# Patient Record
Sex: Female | Born: 1980 | Race: Black or African American | Hispanic: No | Marital: Married | Smoking: Former smoker
Health system: Southern US, Community
[De-identification: ages and names within clinical notes are randomized; demographics above are authoritative.]

## PROBLEM LIST (undated history)

## (undated) ENCOUNTER — Inpatient Hospital Stay (HOSPITAL_COMMUNITY): Payer: Self-pay

## (undated) DIAGNOSIS — N809 Endometriosis, unspecified: Secondary | ICD-10-CM

## (undated) DIAGNOSIS — D560 Alpha thalassemia: Secondary | ICD-10-CM

## (undated) DIAGNOSIS — J302 Other seasonal allergic rhinitis: Secondary | ICD-10-CM

## (undated) DIAGNOSIS — T4145XA Adverse effect of unspecified anesthetic, initial encounter: Secondary | ICD-10-CM

## (undated) DIAGNOSIS — O24419 Gestational diabetes mellitus in pregnancy, unspecified control: Secondary | ICD-10-CM

## (undated) DIAGNOSIS — T8859XA Other complications of anesthesia, initial encounter: Secondary | ICD-10-CM

## (undated) DIAGNOSIS — D126 Benign neoplasm of colon, unspecified: Secondary | ICD-10-CM

## (undated) HISTORY — DX: Other complications of anesthesia, initial encounter: T88.59XA

## (undated) HISTORY — DX: Adverse effect of unspecified anesthetic, initial encounter: T41.45XA

## (undated) HISTORY — PX: DILATION AND CURETTAGE OF UTERUS: SHX78

## (undated) HISTORY — DX: Other seasonal allergic rhinitis: J30.2

## (undated) HISTORY — DX: Benign neoplasm of colon, unspecified: D12.6

---

## 2001-07-17 DIAGNOSIS — O4702 False labor before 37 completed weeks of gestation, second trimester: Secondary | ICD-10-CM

## 2009-05-21 ENCOUNTER — Emergency Department (HOSPITAL_COMMUNITY): Admission: EM | Admit: 2009-05-21 | Discharge: 2009-05-21 | Payer: Self-pay | Admitting: Emergency Medicine

## 2009-09-15 ENCOUNTER — Emergency Department (HOSPITAL_COMMUNITY): Admission: EM | Admit: 2009-09-15 | Discharge: 2009-09-16 | Payer: Self-pay | Admitting: Emergency Medicine

## 2009-10-24 ENCOUNTER — Inpatient Hospital Stay (HOSPITAL_COMMUNITY)
Admission: AD | Admit: 2009-10-24 | Discharge: 2009-10-24 | Payer: Self-pay | Source: Home / Self Care | Admitting: Obstetrics & Gynecology

## 2009-10-24 ENCOUNTER — Ambulatory Visit: Payer: Self-pay | Admitting: Nurse Practitioner

## 2010-02-27 ENCOUNTER — Inpatient Hospital Stay (HOSPITAL_COMMUNITY)
Admission: AD | Admit: 2010-02-27 | Discharge: 2010-02-27 | Payer: Self-pay | Source: Home / Self Care | Attending: Obstetrics | Admitting: Obstetrics

## 2010-04-10 ENCOUNTER — Inpatient Hospital Stay (HOSPITAL_COMMUNITY)
Admission: AD | Admit: 2010-04-10 | Discharge: 2010-04-10 | Disposition: A | Discharge status: Home / Self Care | Payer: Medicaid Other | Source: Ambulatory Visit | Attending: Obstetrics & Gynecology | Admitting: Obstetrics & Gynecology

## 2010-04-10 ENCOUNTER — Encounter (HOSPITAL_COMMUNITY): Payer: Self-pay | Admitting: Radiology

## 2010-04-10 ENCOUNTER — Inpatient Hospital Stay (HOSPITAL_COMMUNITY): Payer: Medicaid Other

## 2010-04-10 DIAGNOSIS — O99891 Other specified diseases and conditions complicating pregnancy: Secondary | ICD-10-CM | POA: Insufficient documentation

## 2010-04-10 DIAGNOSIS — R51 Headache: Secondary | ICD-10-CM | POA: Insufficient documentation

## 2010-04-10 LAB — CBC
Hemoglobin: 10.2 g/dL — ABNORMAL LOW (ref 12.0–15.0)
MCV: 70 fL — ABNORMAL LOW (ref 78.0–100.0)
RDW: 15 % (ref 11.5–15.5)

## 2010-04-10 LAB — COMPREHENSIVE METABOLIC PANEL
AST: 17 U/L (ref 0–37)
Albumin: 2.6 g/dL — ABNORMAL LOW (ref 3.5–5.2)
Alkaline Phosphatase: 93 U/L (ref 39–117)
BUN: 5 mg/dL — ABNORMAL LOW (ref 6–23)
CO2: 22 mEq/L (ref 19–32)
Calcium: 8.5 mg/dL (ref 8.4–10.5)
GFR calc Af Amer: >60 mL/min (ref >60)
Sodium: 133 mEq/L — ABNORMAL LOW (ref 135–145)
Total Bilirubin: 0.3 mg/dL (ref 0.3–1.2)
Total Protein: 6.5 g/dL (ref 6.0–8.3)

## 2010-04-10 LAB — URIC ACID: Uric Acid, Serum: 3.2 mg/dL (ref 2.4–7.0)

## 2010-04-10 LAB — URINALYSIS, ROUTINE W REFLEX MICROSCOPIC
Hgb urine dipstick: NEGATIVE
Protein, ur: NEGATIVE mg/dL

## 2010-04-11 ENCOUNTER — Other Ambulatory Visit: Payer: Self-pay | Admitting: Obstetrics

## 2010-04-17 ENCOUNTER — Inpatient Hospital Stay (HOSPITAL_COMMUNITY): Payer: Medicaid Other

## 2010-04-17 ENCOUNTER — Inpatient Hospital Stay (HOSPITAL_COMMUNITY)
Admission: AD | Admit: 2010-04-17 | Discharge: 2010-04-17 | Disposition: A | Discharge status: Home / Self Care | Payer: Medicaid Other | Source: Ambulatory Visit | Attending: Obstetrics | Admitting: Obstetrics

## 2010-04-17 DIAGNOSIS — O368190 Decreased fetal movements, unspecified trimester, not applicable or unspecified: Secondary | ICD-10-CM | POA: Insufficient documentation

## 2010-04-18 ENCOUNTER — Ambulatory Visit (HOSPITAL_COMMUNITY)
Admission: RE | Admit: 2010-04-18 | Discharge: 2010-04-18 | Disposition: A | Discharge status: Home / Self Care | Payer: Medicaid Other | Source: Ambulatory Visit | Attending: Obstetrics | Admitting: Obstetrics

## 2010-04-18 ENCOUNTER — Other Ambulatory Visit: Payer: Self-pay | Admitting: Obstetrics

## 2010-04-18 ENCOUNTER — Ambulatory Visit (HOSPITAL_COMMUNITY): Admission: RE | Admit: 2010-04-18 | Payer: Medicaid Other | Source: Ambulatory Visit

## 2010-04-18 DIAGNOSIS — Z8751 Personal history of pre-term labor: Secondary | ICD-10-CM | POA: Insufficient documentation

## 2010-04-18 DIAGNOSIS — O09299 Supervision of pregnancy with other poor reproductive or obstetric history, unspecified trimester: Secondary | ICD-10-CM | POA: Insufficient documentation

## 2010-04-18 DIAGNOSIS — O4100X0 Oligohydramnios, unspecified trimester, not applicable or unspecified: Secondary | ICD-10-CM | POA: Insufficient documentation

## 2010-04-18 DIAGNOSIS — O139 Gestational [pregnancy-induced] hypertension without significant proteinuria, unspecified trimester: Secondary | ICD-10-CM | POA: Insufficient documentation

## 2010-04-25 ENCOUNTER — Ambulatory Visit (HOSPITAL_COMMUNITY)
Admission: RE | Admit: 2010-04-25 | Discharge: 2010-04-25 | Disposition: A | Discharge status: Home / Self Care | Payer: Medicaid Other | Source: Ambulatory Visit | Attending: Obstetrics | Admitting: Obstetrics

## 2010-04-25 ENCOUNTER — Other Ambulatory Visit (HOSPITAL_COMMUNITY): Payer: Self-pay | Admitting: Maternal and Fetal Medicine

## 2010-04-25 DIAGNOSIS — O288 Other abnormal findings on antenatal screening of mother: Secondary | ICD-10-CM

## 2010-04-25 DIAGNOSIS — Z8751 Personal history of pre-term labor: Secondary | ICD-10-CM | POA: Insufficient documentation

## 2010-04-25 DIAGNOSIS — Z09 Encounter for follow-up examination after completed treatment for conditions other than malignant neoplasm: Secondary | ICD-10-CM

## 2010-04-25 DIAGNOSIS — O09299 Supervision of pregnancy with other poor reproductive or obstetric history, unspecified trimester: Secondary | ICD-10-CM | POA: Insufficient documentation

## 2010-04-25 DIAGNOSIS — O4100X0 Oligohydramnios, unspecified trimester, not applicable or unspecified: Secondary | ICD-10-CM | POA: Insufficient documentation

## 2010-04-25 DIAGNOSIS — O139 Gestational [pregnancy-induced] hypertension without significant proteinuria, unspecified trimester: Secondary | ICD-10-CM | POA: Insufficient documentation

## 2010-04-30 ENCOUNTER — Inpatient Hospital Stay (HOSPITAL_COMMUNITY)
Admission: RE | Admit: 2010-04-30 | Discharge: 2010-05-01 | DRG: 781 | Disposition: A | Discharge status: Home / Self Care | Payer: Medicaid Other | Source: Ambulatory Visit | Attending: Obstetrics | Admitting: Obstetrics

## 2010-04-30 DIAGNOSIS — E079 Disorder of thyroid, unspecified: Secondary | ICD-10-CM | POA: Diagnosis present

## 2010-04-30 DIAGNOSIS — O479 False labor, unspecified: Secondary | ICD-10-CM | POA: Diagnosis present

## 2010-04-30 DIAGNOSIS — O4100X0 Oligohydramnios, unspecified trimester, not applicable or unspecified: Principal | ICD-10-CM | POA: Diagnosis present

## 2010-04-30 DIAGNOSIS — O34219 Maternal care for unspecified type scar from previous cesarean delivery: Secondary | ICD-10-CM | POA: Diagnosis present

## 2010-04-30 DIAGNOSIS — E039 Hypothyroidism, unspecified: Secondary | ICD-10-CM | POA: Diagnosis present

## 2010-04-30 DIAGNOSIS — O99280 Endocrine, nutritional and metabolic diseases complicating pregnancy, unspecified trimester: Secondary | ICD-10-CM | POA: Diagnosis present

## 2010-04-30 LAB — RPR: RPR Ser Ql: NONREACTIVE

## 2010-04-30 LAB — CBC
HCT: 29.9 % — ABNORMAL LOW (ref 36.0–46.0)
MCV: 68.1 fL — ABNORMAL LOW (ref 78.0–100.0)
RDW: 16.1 % — ABNORMAL HIGH (ref 11.5–15.5)
WBC: 12.2 10*3/uL — ABNORMAL HIGH (ref 4.0–10.5)

## 2010-05-01 ENCOUNTER — Inpatient Hospital Stay (HOSPITAL_COMMUNITY): Payer: Medicaid Other

## 2010-05-02 ENCOUNTER — Other Ambulatory Visit (HOSPITAL_COMMUNITY): Payer: Medicaid Other

## 2010-05-07 ENCOUNTER — Inpatient Hospital Stay (HOSPITAL_COMMUNITY)
Admission: RE | Admit: 2010-05-07 | Discharge: 2010-05-09 | DRG: 775 | Disposition: A | Discharge status: Home / Self Care | Payer: Medicaid Other | Source: Ambulatory Visit | Attending: Obstetrics | Admitting: Obstetrics

## 2010-05-07 DIAGNOSIS — O480 Post-term pregnancy: Principal | ICD-10-CM | POA: Diagnosis present

## 2010-05-07 DIAGNOSIS — O34219 Maternal care for unspecified type scar from previous cesarean delivery: Secondary | ICD-10-CM

## 2010-05-07 DIAGNOSIS — O4290 Premature rupture of membranes, unspecified as to length of time between rupture and onset of labor, unspecified weeks of gestation: Secondary | ICD-10-CM

## 2010-05-07 LAB — RPR: RPR Ser Ql: NONREACTIVE

## 2010-05-07 LAB — CBC
MCH: 22.2 pg — ABNORMAL LOW (ref 26.0–34.0)
RBC: 4.46 MIL/uL (ref 3.87–5.11)
WBC: 14.4 10*3/uL — ABNORMAL HIGH (ref 4.0–10.5)

## 2010-05-08 LAB — CBC
MCH: 22.3 pg — ABNORMAL LOW (ref 26.0–34.0)
MCHC: 32 g/dL (ref 30.0–36.0)
Platelets: 199 10*3/uL (ref 150–400)

## 2010-05-09 LAB — URINALYSIS, ROUTINE W REFLEX MICROSCOPIC
Ketones, ur: NEGATIVE mg/dL
Nitrite: NEGATIVE
Protein, ur: NEGATIVE mg/dL
Urobilinogen, UA: 0.2 mg/dL (ref 0.0–1.0)

## 2010-05-09 LAB — CBC
HCT: 33.8 % — ABNORMAL LOW (ref 36.0–46.0)
Hemoglobin: 11.2 g/dL — ABNORMAL LOW (ref 12.0–15.0)
MCH: 24.8 pg — ABNORMAL LOW (ref 26.0–34.0)
MCHC: 33.2 g/dL (ref 30.0–36.0)
MCV: 74.6 fL — ABNORMAL LOW (ref 78.0–100.0)

## 2010-05-10 ENCOUNTER — Emergency Department (HOSPITAL_COMMUNITY)
Admission: EM | Admit: 2010-05-10 | Discharge: 2010-05-10 | Disposition: A | Discharge status: Home / Self Care | Payer: Medicaid Other | Attending: Emergency Medicine | Admitting: Emergency Medicine

## 2010-05-10 DIAGNOSIS — T391X5A Adverse effect of 4-Aminophenol derivatives, initial encounter: Secondary | ICD-10-CM | POA: Insufficient documentation

## 2010-05-10 DIAGNOSIS — Y929 Unspecified place or not applicable: Secondary | ICD-10-CM | POA: Insufficient documentation

## 2010-05-10 DIAGNOSIS — L299 Pruritus, unspecified: Secondary | ICD-10-CM | POA: Insufficient documentation

## 2010-05-10 DIAGNOSIS — E039 Hypothyroidism, unspecified: Secondary | ICD-10-CM | POA: Insufficient documentation

## 2010-05-10 DIAGNOSIS — J45909 Unspecified asthma, uncomplicated: Secondary | ICD-10-CM | POA: Insufficient documentation

## 2010-05-10 DIAGNOSIS — R110 Nausea: Secondary | ICD-10-CM | POA: Insufficient documentation

## 2010-05-11 LAB — URINALYSIS, ROUTINE W REFLEX MICROSCOPIC
Bilirubin Urine: NEGATIVE
Glucose, UA: NEGATIVE mg/dL
Hgb urine dipstick: NEGATIVE
Ketones, ur: NEGATIVE mg/dL
Protein, ur: NEGATIVE mg/dL

## 2010-05-11 LAB — DIFFERENTIAL
Basophils Relative: 0 % (ref 0–1)
Eosinophils Absolute: 0 10*3/uL (ref 0.0–0.7)
Lymphocytes Relative: 23 % (ref 12–46)
Monocytes Relative: 6 % (ref 3–12)
Neutro Abs: 8.7 10*3/uL — ABNORMAL HIGH (ref 1.7–7.7)

## 2010-05-11 LAB — ABO/RH: ABO/RH(D): B POS

## 2010-05-11 LAB — GC/CHLAMYDIA PROBE AMP, GENITAL: Chlamydia, DNA Probe: NEGATIVE

## 2010-05-11 LAB — URINE CULTURE

## 2010-05-11 LAB — POCT I-STAT, CHEM 8
BUN: 8 mg/dL (ref 6–23)
Chloride: 104 mEq/L (ref 96–112)
Potassium: 3.3 mEq/L — ABNORMAL LOW (ref 3.5–5.1)
Sodium: 140 mEq/L (ref 135–145)

## 2010-05-11 LAB — CBC
HCT: 35.3 % — ABNORMAL LOW (ref 36.0–46.0)
Hemoglobin: 11.5 g/dL — ABNORMAL LOW (ref 12.0–15.0)
MCHC: 32.7 g/dL (ref 30.0–36.0)

## 2010-05-11 LAB — URINE MICROSCOPIC-ADD ON

## 2010-05-11 LAB — WET PREP, GENITAL: WBC, Wet Prep HPF POC: NONE SEEN

## 2010-06-22 ENCOUNTER — Emergency Department (HOSPITAL_COMMUNITY)
Admission: EM | Admit: 2010-06-22 | Discharge: 2010-06-22 | Disposition: A | Discharge status: Home / Self Care | Payer: Medicaid Other | Attending: Emergency Medicine | Admitting: Emergency Medicine

## 2010-06-22 DIAGNOSIS — J45909 Unspecified asthma, uncomplicated: Secondary | ICD-10-CM | POA: Insufficient documentation

## 2010-06-22 DIAGNOSIS — A499 Bacterial infection, unspecified: Secondary | ICD-10-CM | POA: Insufficient documentation

## 2010-06-22 DIAGNOSIS — R10813 Right lower quadrant abdominal tenderness: Secondary | ICD-10-CM | POA: Insufficient documentation

## 2010-06-22 DIAGNOSIS — E039 Hypothyroidism, unspecified: Secondary | ICD-10-CM | POA: Insufficient documentation

## 2010-06-22 DIAGNOSIS — B9689 Other specified bacterial agents as the cause of diseases classified elsewhere: Secondary | ICD-10-CM | POA: Insufficient documentation

## 2010-06-22 DIAGNOSIS — R1031 Right lower quadrant pain: Secondary | ICD-10-CM | POA: Insufficient documentation

## 2010-06-22 DIAGNOSIS — D560 Alpha thalassemia: Secondary | ICD-10-CM | POA: Insufficient documentation

## 2010-06-22 DIAGNOSIS — N760 Acute vaginitis: Secondary | ICD-10-CM | POA: Insufficient documentation

## 2010-06-22 LAB — POCT I-STAT, CHEM 8
Chloride: 106 mEq/L (ref 96–112)
Creatinine, Ser: 1 mg/dL (ref 0.4–1.2)
Glucose, Bld: 78 mg/dL (ref 70–99)
HCT: 39 % (ref 36.0–46.0)
Potassium: 4.2 mEq/L (ref 3.5–5.1)
Sodium: 141 mEq/L (ref 135–145)

## 2010-06-22 LAB — CBC
Hemoglobin: 12.2 g/dL (ref 12.0–15.0)
MCH: 22.8 pg — ABNORMAL LOW (ref 26.0–34.0)
Platelets: 172 10*3/uL (ref 150–400)
RBC: 5.34 MIL/uL — ABNORMAL HIGH (ref 3.87–5.11)
WBC: 10.1 10*3/uL (ref 4.0–10.5)

## 2010-06-22 LAB — DIFFERENTIAL
Eosinophils Relative: 1 % (ref 0–5)
Lymphocytes Relative: 28 % (ref 12–46)
Lymphs Abs: 2.8 10*3/uL (ref 0.7–4.0)
Monocytes Relative: 6 % (ref 3–12)

## 2010-06-22 LAB — URINALYSIS, ROUTINE W REFLEX MICROSCOPIC
Bilirubin Urine: NEGATIVE
Hgb urine dipstick: NEGATIVE
Protein, ur: NEGATIVE mg/dL
Urobilinogen, UA: 0.2 mg/dL (ref 0.0–1.0)

## 2010-06-22 LAB — URINE MICROSCOPIC-ADD ON

## 2010-06-22 LAB — WET PREP, GENITAL
Clue Cells Wet Prep HPF POC: NONE SEEN
Trich, Wet Prep: NONE SEEN
Yeast Wet Prep HPF POC: NONE SEEN

## 2010-06-24 LAB — GC/CHLAMYDIA PROBE AMP, GENITAL: GC Probe Amp, Genital: NEGATIVE

## 2010-08-23 ENCOUNTER — Emergency Department (HOSPITAL_COMMUNITY)
Admission: EM | Admit: 2010-08-23 | Discharge: 2010-08-23 | Disposition: A | Discharge status: Home / Self Care | Payer: Medicaid Other | Attending: Emergency Medicine | Admitting: Emergency Medicine

## 2010-08-23 DIAGNOSIS — E039 Hypothyroidism, unspecified: Secondary | ICD-10-CM | POA: Insufficient documentation

## 2010-08-23 DIAGNOSIS — N760 Acute vaginitis: Secondary | ICD-10-CM | POA: Insufficient documentation

## 2010-08-23 DIAGNOSIS — R109 Unspecified abdominal pain: Secondary | ICD-10-CM | POA: Insufficient documentation

## 2010-08-23 DIAGNOSIS — D560 Alpha thalassemia: Secondary | ICD-10-CM | POA: Insufficient documentation

## 2010-08-23 DIAGNOSIS — A499 Bacterial infection, unspecified: Secondary | ICD-10-CM | POA: Insufficient documentation

## 2010-08-23 DIAGNOSIS — B9689 Other specified bacterial agents as the cause of diseases classified elsewhere: Secondary | ICD-10-CM | POA: Insufficient documentation

## 2010-08-23 DIAGNOSIS — J45909 Unspecified asthma, uncomplicated: Secondary | ICD-10-CM | POA: Insufficient documentation

## 2010-08-23 LAB — POCT I-STAT, CHEM 8
Calcium, Ion: 1.18 mmol/L (ref 1.12–1.32)
Creatinine, Ser: 0.9 mg/dL (ref 0.50–1.10)
Glucose, Bld: 93 mg/dL (ref 70–99)
HCT: 38 % (ref 36.0–46.0)
Hemoglobin: 12.9 g/dL (ref 12.0–15.0)
Potassium: 4 mEq/L (ref 3.5–5.1)
TCO2: 24 mmol/L (ref 0–100)

## 2010-08-23 LAB — DIFFERENTIAL
Basophils Relative: 1 % (ref 0–1)
Eosinophils Relative: 2 % (ref 0–5)
Lymphocytes Relative: 35 % (ref 12–46)
Monocytes Relative: 8 % (ref 3–12)
Neutrophils Relative %: 54 % (ref 43–77)

## 2010-08-23 LAB — CBC
HCT: 35.6 % — ABNORMAL LOW (ref 36.0–46.0)
Hemoglobin: 12.5 g/dL (ref 12.0–15.0)
MCH: 23.7 pg — ABNORMAL LOW (ref 26.0–34.0)
MCV: 67.4 fL — ABNORMAL LOW (ref 78.0–100.0)
RBC: 5.28 MIL/uL — ABNORMAL HIGH (ref 3.87–5.11)
WBC: 7.8 10*3/uL (ref 4.0–10.5)

## 2010-08-23 LAB — URINALYSIS, ROUTINE W REFLEX MICROSCOPIC
Nitrite: NEGATIVE
Protein, ur: NEGATIVE mg/dL
Specific Gravity, Urine: 1.024 (ref 1.005–1.030)
Urobilinogen, UA: 0.2 mg/dL (ref 0.0–1.0)

## 2010-08-23 LAB — WET PREP, GENITAL: Trich, Wet Prep: NONE SEEN

## 2010-08-23 LAB — URINE MICROSCOPIC-ADD ON

## 2010-08-24 LAB — GC/CHLAMYDIA PROBE AMP, GENITAL
Chlamydia, DNA Probe: NEGATIVE
GC Probe Amp, Genital: NEGATIVE

## 2010-12-10 ENCOUNTER — Encounter (HOSPITAL_COMMUNITY): Payer: Self-pay | Admitting: *Deleted

## 2010-12-16 ENCOUNTER — Emergency Department (HOSPITAL_COMMUNITY)
Admission: EM | Admit: 2010-12-16 | Discharge: 2010-12-16 | Disposition: A | Discharge status: Home / Self Care | Payer: Medicaid Other | Attending: Emergency Medicine | Admitting: Emergency Medicine

## 2010-12-16 ENCOUNTER — Emergency Department (HOSPITAL_COMMUNITY): Payer: Medicaid Other

## 2010-12-16 DIAGNOSIS — R05 Cough: Secondary | ICD-10-CM | POA: Insufficient documentation

## 2010-12-16 DIAGNOSIS — J45909 Unspecified asthma, uncomplicated: Secondary | ICD-10-CM | POA: Insufficient documentation

## 2010-12-16 DIAGNOSIS — R0789 Other chest pain: Secondary | ICD-10-CM | POA: Insufficient documentation

## 2010-12-16 DIAGNOSIS — J3489 Other specified disorders of nose and nasal sinuses: Secondary | ICD-10-CM | POA: Insufficient documentation

## 2010-12-16 DIAGNOSIS — D560 Alpha thalassemia: Secondary | ICD-10-CM | POA: Insufficient documentation

## 2010-12-16 DIAGNOSIS — R197 Diarrhea, unspecified: Secondary | ICD-10-CM | POA: Insufficient documentation

## 2010-12-16 DIAGNOSIS — R1110 Vomiting, unspecified: Secondary | ICD-10-CM | POA: Insufficient documentation

## 2010-12-16 DIAGNOSIS — J029 Acute pharyngitis, unspecified: Secondary | ICD-10-CM | POA: Insufficient documentation

## 2010-12-16 DIAGNOSIS — E039 Hypothyroidism, unspecified: Secondary | ICD-10-CM | POA: Insufficient documentation

## 2010-12-16 DIAGNOSIS — R059 Cough, unspecified: Secondary | ICD-10-CM | POA: Insufficient documentation

## 2010-12-16 DIAGNOSIS — J40 Bronchitis, not specified as acute or chronic: Secondary | ICD-10-CM | POA: Insufficient documentation

## 2011-03-07 ENCOUNTER — Emergency Department (HOSPITAL_COMMUNITY)
Admission: EM | Admit: 2011-03-07 | Discharge: 2011-03-07 | Disposition: A | Discharge status: Home / Self Care | Payer: Medicaid Other | Attending: Emergency Medicine | Admitting: Emergency Medicine

## 2011-03-07 DIAGNOSIS — J029 Acute pharyngitis, unspecified: Secondary | ICD-10-CM | POA: Insufficient documentation

## 2011-03-07 DIAGNOSIS — H9209 Otalgia, unspecified ear: Secondary | ICD-10-CM | POA: Insufficient documentation

## 2011-03-07 NOTE — ED Notes (Signed)
sorethroat headache with a  Rt earache for 2-3 days and some chest pain with coughing.  Not sure if temp

## 2011-03-07 NOTE — ED Provider Notes (Signed)
History     CSN: [id number]  Arrival date & time 03/07/11  4098   First MD Initiated Contact with Patient 03/07/11 0229      Chief Complaint  Patient presents with  . Sore Throat    HPI: Patient is a 31 y.o. female presenting with pharyngitis. The history is provided by the patient.  Sore Throat This is a new problem. The current episode started in the past 7 days. The problem occurs constantly. The problem has been gradually worsening. Associated symptoms include a sore throat. Pertinent negatives include no abdominal pain, arthralgias, chest pain, chills, congestion, coughing, fever, headaches, myalgias, nausea, rash or swollen glands.  Patient reports 2 day history of sore throat and right ear pain. Admits that her child was recently sick with similar symptoms.  No past medical history on file.  No past surgical history on file.  No family history on file.  History  Substance Use Topics  . Smoking status: Not on file  . Smokeless tobacco: Not on file  . Alcohol Use: Not on file    OB History    Grav Para Term Preterm Abortions TAB SAB Ect Mult Living   1 1 1              Review of Systems  Constitutional: Negative.  Negative for fever and chills.  HENT: Positive for sore throat. Negative for congestion.   Eyes: Negative.   Respiratory: Negative.  Negative for cough.   Cardiovascular: Negative.  Negative for chest pain.  Gastrointestinal: Negative.  Negative for nausea and abdominal pain.  Genitourinary: Negative.   Musculoskeletal: Negative.  Negative for myalgias and arthralgias.  Skin: Negative.  Negative for rash.  Neurological: Negative.  Negative for headaches.  Hematological: Negative.   Psychiatric/Behavioral: Negative.     Allergies  Epidural tray  Home Medications   Current Outpatient Rx  Name Route Sig Dispense Refill  . PRENATAL 27-0.8 MG PO TABS Oral Take 1 tablet by mouth daily.    Marland Kitchen PRESCRIPTION MEDICATION Oral Take 1 tablet by mouth daily.  Camilla birth control      BP 121/84  Pulse 104  Temp 98.4 F (36.9 C)  Resp 20  SpO2 99%  Physical Exam  Constitutional: She is oriented to person, place, and time. She appears well-developed and well-nourished.  HENT:  Head: Normocephalic and atraumatic.  Right Ear: Hearing, tympanic membrane, external ear and ear canal normal.  Left Ear: Hearing, tympanic membrane, external ear and ear canal normal.  Nose: Nose normal.  Mouth/Throat: Uvula is midline, oropharynx is clear and moist and mucous membranes are normal.       Minimal pharyngeal erythema  Eyes: Conjunctivae are normal. Pupils are equal, round, and reactive to light.  Neck: Normal range of motion. Neck supple.  Cardiovascular: Normal rate and regular rhythm.   Pulmonary/Chest: Effort normal and breath sounds normal.  Abdominal: Soft. Bowel sounds are normal.  Musculoskeletal: Normal range of motion.  Neurological: She is alert and oriented to person, place, and time.  Skin: Skin is warm and dry. Rash noted. Rash is papular. No erythema.  Psychiatric: She has a normal mood and affect.    ED Course  Procedures findings and clinical impression discussed with patient. We'll plan for discharge home with instructions for pharyngitis. Patient agreeable with plan to   Labs Reviewed  RAPID STREP SCREEN   No results found.   No diagnosis found.    MDM  HPI/PE and clinical findings c/w pharyngitis (liely viral)  Leanne Chang, NP 03/07/11 [id number]

## 2011-03-07 NOTE — ED Notes (Signed)
Pt to ED c/o throat pain that radiates up to R and L ears.  Several years ago she had hx of multiple episodes of tonsillitis.  Denies fever or cough.

## 2011-03-07 NOTE — ED Provider Notes (Signed)
Medical screening examination/treatment/procedure(s) were performed by non-physician practitioner and as supervising physician I was immediately available for consultation/collaboration.  [place] Valli K Ragena Fiola-Rasch, MD 03/07/11 0830 

## 2011-03-10 ENCOUNTER — Other Ambulatory Visit: Payer: Self-pay | Admitting: Obstetrics

## 2011-05-01 ENCOUNTER — Encounter: Payer: Self-pay | Admitting: Gastroenterology

## 2011-05-15 ENCOUNTER — Ambulatory Visit: Payer: Medicaid Other | Admitting: Gastroenterology

## 2011-07-02 ENCOUNTER — Encounter (HOSPITAL_COMMUNITY): Payer: Self-pay | Admitting: *Deleted

## 2011-07-02 ENCOUNTER — Emergency Department (HOSPITAL_COMMUNITY)
Admission: EM | Admit: 2011-07-02 | Discharge: 2011-07-02 | Disposition: A | Discharge status: Home / Self Care | Payer: Medicaid Other | Attending: Emergency Medicine | Admitting: Emergency Medicine

## 2011-07-02 DIAGNOSIS — F17200 Nicotine dependence, unspecified, uncomplicated: Secondary | ICD-10-CM | POA: Insufficient documentation

## 2011-07-02 DIAGNOSIS — J45909 Unspecified asthma, uncomplicated: Secondary | ICD-10-CM | POA: Insufficient documentation

## 2011-07-02 DIAGNOSIS — M549 Dorsalgia, unspecified: Secondary | ICD-10-CM

## 2011-07-02 DIAGNOSIS — M545 Low back pain, unspecified: Secondary | ICD-10-CM | POA: Insufficient documentation

## 2011-07-02 LAB — URINALYSIS, ROUTINE W REFLEX MICROSCOPIC
Bilirubin Urine: NEGATIVE
Glucose, UA: NEGATIVE mg/dL
Hgb urine dipstick: NEGATIVE
Ketones, ur: NEGATIVE mg/dL
Leukocytes, UA: NEGATIVE
Nitrite: NEGATIVE
Protein, ur: NEGATIVE mg/dL
Specific Gravity, Urine: 1.03 (ref 1.005–1.030)
Urobilinogen, UA: 0.2 mg/dL (ref 0.0–1.0)
pH: 6.5 (ref 5.0–8.0)

## 2011-07-02 LAB — POCT PREGNANCY, URINE
Preg Test, Ur: NEGATIVE
Preg Test, Ur: NEGATIVE

## 2011-07-02 MED ORDER — IBUPROFEN 800 MG PO TABS: 800.0000 mg | ORAL_TABLET | Freq: Three times a day (TID) | ORAL | Status: AC

## 2011-07-02 MED ORDER — CYCLOBENZAPRINE HCL 10 MG PO TABS: 10.0000 mg | ORAL_TABLET | Freq: Two times a day (BID) | ORAL | Status: AC | PRN

## 2011-07-02 NOTE — Discharge Instructions (Signed)
FOLLOW UP WITH YOUR DOCTOR FOR RECHECK NEXT WEEK TO INSURE SYMPTOMS ARE IMPROVING. REST THE BACK, TAKE MEDICATIONS AS PRESCRIBED.   Back Pain, Adult Low back pain is very common. About 1 in 5 people have back pain.The cause of low back pain is rarely dangerous. The pain often gets better over time.About half of people with a sudden onset of back pain feel better in just 2 weeks. About 8 in 10 people feel better by 6 weeks.  CAUSES Some common causes of back pain include:  Strain of the muscles or ligaments supporting the spine.   Wear and tear (degeneration) of the spinal discs.   Arthritis.   Direct injury to the back.  DIAGNOSIS Most of the time, the direct cause of low back pain is not known.However, back pain can be treated effectively even when the exact cause of the pain is unknown.Answering your caregiver's questions about your overall health and symptoms is one of the most accurate ways to make sure the cause of your pain is not dangerous. If your caregiver needs more information, he or she may order lab work or imaging tests (X-rays or MRIs).However, even if imaging tests show changes in your back, this usually does not require surgery. HOME CARE INSTRUCTIONS For many people, back pain returns.Since low back pain is rarely dangerous, it is often a condition that people can learn to [facility] their own.   Remain active. It is stressful on the back to sit or stand in one place. Do not sit, drive, or stand in one place for more than 30 minutes at a time. Take short walks on level surfaces as soon as pain allows.Try to increase the length of time you walk each day.   Do not stay in bed.Resting more than 1 or 2 days can delay your recovery.   Do not avoid exercise or work.Your body is made to move.It is not dangerous to be active, even though your back may hurt.Your back will likely heal faster if you return to being active before your pain is gone.   Pay attention to your  body when you bend and lift. Many people have less discomfortwhen lifting if they bend their knees, keep the load close to their bodies,and avoid twisting. Often, the most comfortable positions are those that put less stress on your recovering back.   Find a comfortable position to sleep. Use a firm mattress and lie on your side with your knees slightly bent. If you lie on your back, put a pillow under your knees.   Only take over-the-counter or prescription medicines as directed by your caregiver. Over-the-counter medicines to reduce pain and inflammation are often the most helpful.Your caregiver may prescribe muscle relaxant drugs.These medicines help dull your pain so you can more quickly return to your normal activities and healthy exercise.   Put ice on the injured area.   Put ice in a plastic bag.   Place a towel between your skin and the bag.   Leave the ice on for 15 to 20 minutes, 3 to 4 times a day for the first 2 to 3 days. After that, ice and heat may be alternated to reduce pain and spasms.   Ask your caregiver about trying back exercises and gentle massage. This may be of some benefit.   Avoid feeling anxious or stressed.Stress increases muscle tension and can worsen back pain.It is important to recognize when you are anxious or stressed and learn ways to manage it.Exercise is a  great option.  SEEK MEDICAL CARE IF:  You have pain that is not relieved with rest or medicine.   You have pain that does not improve in 1 week.   You have new symptoms.   You are generally not feeling well.  SEEK IMMEDIATE MEDICAL CARE IF:   You have pain that radiates from your back into your legs.   You develop new bowel or bladder control problems.   You have unusual weakness or numbness in your arms or legs.   You develop nausea or vomiting.   You develop abdominal pain.   You feel faint.  Document Released: 02/10/2005 Document Revised: 01/30/2011 Document Reviewed:  07/01/2010 [facility] Patient Information 2012 [place], [place].

## 2011-07-02 NOTE — ED Provider Notes (Signed)
History     CSN: [id number]  Arrival date & time 07/02/11  1043   First MD Initiated Contact with Patient 07/02/11 1143      Chief Complaint  Patient presents with  . Back Pain    (Consider location/radiation/quality/duration/timing/severity/associated sxs/prior treatment) Patient is a 31 y.o. female presenting with back pain. The history is provided by the patient.  Back Pain  This is a new problem. The current episode started more than 2 days ago. The problem has not changed since onset.The pain is associated with no known injury. Pertinent negatives include no numbness, no abdominal pain and no dysuria. Associated symptoms comments: Lower left back pain for several days. Better with rest, but recurs when she returns to activity. No urinary problems, no abdominal pain. No fever, N, V. .    Past Medical History  Diagnosis Date  . Asthma     Past Surgical History  Procedure Date  . Cesarean section     History reviewed. No pertinent family history.  History  Substance Use Topics  . Smoking status: Current Everyday Smoker  . Smokeless tobacco: Not on file  . Alcohol Use: Yes     occ    OB History    Grav Para Term Preterm Abortions TAB SAB Ect Mult Living   1 1 1              Review of Systems  Gastrointestinal: Negative for abdominal pain.  Genitourinary: Negative for dysuria.  Musculoskeletal: Positive for back pain.  Neurological: Negative for numbness.    Allergies  Nerve block tray  Home Medications   Current Outpatient Rx  Name Route Sig Dispense Refill  . ESCITALOPRAM OXALATE 10 MG PO TABS Oral Take 10 mg by mouth daily.    . IBUPROFEN 200 MG PO TABS Oral Take 400 mg by mouth every 6 (six) hours as needed. For pain    . PRENATAL 27-0.8 MG PO TABS Oral Take 1 tablet by mouth daily.    Marland Kitchen PRESCRIPTION MEDICATION Oral Take 1 tablet by mouth daily. Camilla birth control      BP 119/75  Pulse 86  Temp(Src) 98.1 F (36.7 C) (Oral)  Resp 16  SpO2  100%  Breastfeeding? Unknown  Physical Exam  Constitutional: She is oriented to person, place, and time. She appears well-developed and well-nourished.  Neck: Normal range of motion.  Pulmonary/Chest: Effort normal.  Abdominal: Soft. Bowel sounds are normal. There is no tenderness.  Musculoskeletal:       Upper left lumbar tenderness that is mild. No swelling.   Neurological: She is alert and oriented to person, place, and time.  Skin: Skin is warm and dry.    ED Course  Procedures (including critical care time)   Labs Reviewed  URINALYSIS, ROUTINE W REFLEX MICROSCOPIC  POCT PREGNANCY, URINE   No results found.   No diagnosis found.   1. Muscular back pain   MDM  Doubt related to kidney even given location as there are no urinary symptoms, N, V or radiation. The pain is greater with movement, better with rest which supports muscular pain.         Rodena Medin, PA-C 07/02/11 1217

## 2011-07-02 NOTE — ED Notes (Signed)
Pt reports left lower back pain since Friday, denies urinary symptoms, does not radiate to groin, pt reports pain is constant, pt reports increase pain with certain movements

## 2011-07-07 NOTE — ED Provider Notes (Signed)
Medical screening examination/treatment/procedure(s) were performed by non-physician practitioner and as supervising physician I was immediately available for consultation/collaboration.  Sierra Spargo, MD 07/07/11 0955 

## 2012-02-25 HISTORY — PX: WISDOM TOOTH EXTRACTION: SHX21

## 2013-04-22 ENCOUNTER — Emergency Department (HOSPITAL_COMMUNITY): Payer: Medicaid Other

## 2013-04-22 ENCOUNTER — Encounter (HOSPITAL_COMMUNITY): Payer: Self-pay | Admitting: Emergency Medicine

## 2013-04-22 ENCOUNTER — Emergency Department (HOSPITAL_COMMUNITY)
Admission: EM | Admit: 2013-04-22 | Discharge: 2013-04-23 | Disposition: A | Discharge status: Home / Self Care | Payer: Medicaid Other | Attending: Emergency Medicine | Admitting: Emergency Medicine

## 2013-04-22 DIAGNOSIS — R112 Nausea with vomiting, unspecified: Secondary | ICD-10-CM | POA: Insufficient documentation

## 2013-04-22 DIAGNOSIS — J069 Acute upper respiratory infection, unspecified: Secondary | ICD-10-CM

## 2013-04-22 DIAGNOSIS — Z87891 Personal history of nicotine dependence: Secondary | ICD-10-CM | POA: Insufficient documentation

## 2013-04-22 DIAGNOSIS — J45901 Unspecified asthma with (acute) exacerbation: Secondary | ICD-10-CM

## 2013-04-22 DIAGNOSIS — Z79899 Other long term (current) drug therapy: Secondary | ICD-10-CM | POA: Insufficient documentation

## 2013-04-22 DIAGNOSIS — Z880 Allergy status to penicillin: Secondary | ICD-10-CM | POA: Insufficient documentation

## 2013-04-22 LAB — BASIC METABOLIC PANEL
BUN: 8 mg/dL (ref 6–23)
CHLORIDE: 105 meq/L (ref 96–112)
CO2: 24 meq/L (ref 19–32)
CREATININE: 0.8 mg/dL (ref 0.50–1.10)
Calcium: 8.9 mg/dL (ref 8.4–10.5)
GFR calc Af Amer: >90 mL/min (ref >90)
GFR calc non Af Amer: >90 mL/min (ref >90)
Glucose, Bld: 115 mg/dL — ABNORMAL HIGH (ref 70–99)
Potassium: 3.3 mEq/L — ABNORMAL LOW (ref 3.7–5.3)
Sodium: 142 mEq/L (ref 137–147)

## 2013-04-22 LAB — CBC
HEMATOCRIT: 37.1 % (ref 36.0–46.0)
Hemoglobin: 12.5 g/dL (ref 12.0–15.0)
MCH: 23.6 pg — ABNORMAL LOW (ref 26.0–34.0)
MCHC: 33.7 g/dL (ref 30.0–36.0)
MCV: 70 fL — AB (ref 78.0–100.0)
Platelets: 159 10*3/uL (ref 150–400)
RBC: 5.3 MIL/uL — AB (ref 3.87–5.11)
RDW: 14.5 % (ref 11.5–15.5)
WBC: 9.2 10*3/uL (ref 4.0–10.5)

## 2013-04-22 LAB — I-STAT TROPONIN, ED: Troponin i, poc: 0.01 ng/mL (ref 0.00–0.08)

## 2013-04-22 MED ORDER — ALBUTEROL SULFATE (2.5 MG/3ML) 0.083% IN NEBU
5.0000 mg | INHALATION_SOLUTION | Freq: Once | RESPIRATORY_TRACT | Status: AC
  Administered 2013-04-22: 5 mg via RESPIRATORY_TRACT
  Filled 2013-04-22: qty 6

## 2013-04-22 MED ORDER — PREDNISONE 10 MG PO TABS: 20.0000 mg | ORAL_TABLET | Freq: Every day | ORAL | Status: DC

## 2013-04-22 MED ORDER — IPRATROPIUM-ALBUTEROL 0.5-2.5 (3) MG/3ML IN SOLN
3.0000 mL | Freq: Once | RESPIRATORY_TRACT | Status: AC
  Administered 2013-04-22: 3 mL via RESPIRATORY_TRACT
  Filled 2013-04-22: qty 3

## 2013-04-22 MED ORDER — IPRATROPIUM-ALBUTEROL 0.5-2.5 (3) MG/3ML IN SOLN: 3.0000 mL | Freq: Four times a day (QID) | RESPIRATORY_TRACT | Status: DC | PRN

## 2013-04-22 MED ORDER — PREDNISONE 20 MG PO TABS
60.0000 mg | ORAL_TABLET | Freq: Once | ORAL | Status: AC
  Administered 2013-04-22: 60 mg via ORAL
  Filled 2013-04-22: qty 3

## 2013-04-22 NOTE — ED Notes (Addendum)
Presents with SOB, cough,  fever, runny nose, earache, nausea and vomiting began yesterday associated with inability to tolerate lying flat or bending forward due to increase of SOB. Breathing treatments are not helping. Endorses a low grade fever.  Bilateral breath sounds diminished in lower fields.  +productive cough with yellow/blood tinged mucous.  +chest tightness-worse with deep inspiration

## 2013-04-22 NOTE — Discharge Instructions (Signed)
Asthma, Adult Asthma is a condition of the lungs in which the airways tighten and narrow. Asthma can make it hard to breathe. Asthma cannot be cured, but medicine and lifestyle changes can help control it. Asthma may be started (triggered) by:  Animal skin flakes (dander).  Dust.  Cockroaches.  Pollen.  Mold.  Smoke.  Cleaning products.  Hair sprays or aerosol sprays.  Paint fumes or strong smells.  Cold air, weather changes, and winds.  Crying or laughing hard.  Stress.  Certain medicines or drugs.  Foods, such as dried fruit, potato chips, and sparkling grape juice.  Infections or conditions (colds, flu).  Exercise.  Certain medical conditions or diseases.  Exercise or tiring activities. HOME CARE   Take medicine as told by your doctor.  Use a peak flow meter as told by your doctor. A peak flow meter is a tool that measures how well the lungs are working.  Record and keep track of the peak flow meter's readings.  Understand and use the asthma action plan. An asthma action plan is a written plan for taking care of your asthma and treating your attacks.  To help prevent asthma attacks:  Do not smoke. Stay away from secondhand smoke.  Change your heating and air conditioning filter often.  Limit your use of fireplaces and wood stoves.  Get rid of pests (such as roaches and mice) and their droppings.  Throw away plants if you see mold on them.  Clean your floors. Dust regularly. Use cleaning products that do not smell.  Have someone vacuum when you are not home. Use a vacuum cleaner with a HEPA filter if possible.  Replace carpet with wood, tile, or vinyl flooring. Carpet can trap animal skin flakes and dust.  Use allergy-proof pillows, mattress covers, and box spring covers.  Wash bed sheets and blankets every week in hot water and dry them in a dryer.  Use blankets that are made of polyester or cotton.  Clean bathrooms and kitchens with bleach.  If possible, have someone repaint the walls in these rooms with mold-resistant paint. Keep out of the rooms that are being cleaned and painted.  Wash hands often. GET HELP IF:  You have make a whistling sound when breaking (wheeze), have shortness of breath, or have a cough even if taking medicine to prevent attacks.  The colored mucus you cough up (sputum) is thicker than usual.  The colored mucus you cough up changes from clear or white to yellow, green, gray, or bloody.  You have problems from the medicine you are taking such as:  A rash.  Itching.  Swelling.  Trouble breathing.  You need reliever medicines more than 2 3 times a week.  Your peak flow measurement is still at 50 79% of your personal best after following the action plan for 1 hour. GET HELP RIGHT AWAY IF:   You seem to be worse and are not responding to medicine during an asthma attack.  You are short of breath even at rest.  You get short of breath when doing very little activity.  You have trouble eating, drinking, or talking.  You have chest pain.  You have a fast heartbeat.  Your lips or fingernails start to turn blue.  You are lightheaded, dizzy, or faint.  Your peak flow is less than 50% of your personal best.  You have a fever or lasting symptoms for more than 2 3 days.  You have a fever and your symptoms suddenly  get worse. MAKE SURE YOU:   Understand these instructions.  Will watch your condition.  Will get help right away if you are not doing well or get worse. Document Released: 07/30/2007 Document Revised: 12/01/2012 Document Reviewed: 09/09/2012 [facility] Patient Information 2014 [place], [place]. Please use Kristen [name] DuoNeb every 4-6 hours while awake for the next 2 days and then as, needed.  You've also been given a prescription for prednisone.  Please take this as directed until all tablets have been, completed.  You've also been referred to [employer] for regular medical  care

## 2013-04-22 NOTE — ED Provider Notes (Signed)
CSN: [id number]     Arrival date & time 04/22/13  1714 History   First MD Initiated Contact with Patient 04/22/13 2120     Chief Complaint  Patient presents with  . Shortness of Breath     (Consider location/radiation/quality/duration/timing/severity/associated sxs/prior Treatment) HPI Comments: Patient with history of, asthma, reports, that she's had myalgias, low-grade fever, rhinitis, consistent with, URI.  She's had to use her inhaler more often than normal.  She's not been getting any relief from this.  She used her inhaler twice today.  Still having a cough, and feeling short of breath.  She has not been on steroids in over 3 years.  She has not seen her primary care physician  Patient is a 33 y.o. female presenting with shortness of breath. The history is provided by the patient.  Shortness of Breath Severity:  Moderate Onset quality:  Gradual Timing:  Intermittent Progression:  Worsening Chronicity:  Recurrent Relieved by:  Nothing Worsened by:  Activity Ineffective treatments:  Inhaler Associated symptoms: cough, fever and vomiting   Associated symptoms: no headaches   Fever:    Timing:  Unable to specify Vomiting:    Quality:  Undigested food   Severity:  Mild   Timing:  Intermittent Risk factors: obesity     Past Medical History  Diagnosis Date  . Asthma    Past Surgical History  Procedure Laterality Date  . Cesarean section     History reviewed. No pertinent family history. History  Substance Use Topics  . Smoking status: Former Research scientist (life sciences)  . Smokeless tobacco: Not on file  . Alcohol Use: Yes     Comment: occ   OB History   Grav Para Term Preterm Abortions TAB SAB Ect Mult Living   1 1 1             Review of Systems  Constitutional: Positive for fever. Negative for chills.  HENT: Positive for rhinorrhea.   Respiratory: Positive for cough and shortness of breath.   Gastrointestinal: Positive for nausea and vomiting.  Neurological: Negative for  dizziness and headaches.  All other systems reviewed and are negative.      Allergies  Nerve block tray; Penicillins; and Amoxicillin  Home Medications   Current Outpatient Rx  Name  Route  Sig  Dispense  Refill  . albuterol (PROVENTIL) (2.5 MG/3ML) 0.083% nebulizer solution   Nebulization   Take 2.5 mg by nebulization every 6 (six) hours as needed for wheezing or shortness of breath.         . guaiFENesin (ROBITUSSIN) 100 MG/5ML SOLN   Oral   Take 10 mLs by mouth every 4 (four) hours as needed for cough or to loosen phlegm.         Marland Kitchen Phenyleph-CPM-DM-APAP (TYLENOL COLD MULTI-SYMPTOM) 06-25-08-325 MG TABS   Oral   Take 2 tablets by mouth 2 (two) times daily as needed (for cold symptoms).         . Prenatal Vit-Fe Fumarate-FA (MULTIVITAMIN-PRENATAL) 27-0.8 MG TABS   Oral   Take 1 tablet by mouth daily.         Marland Kitchen ipratropium-albuterol (DUONEB) 0.5-2.5 (3) MG/3ML SOLN   Nebulization   Take 3 mLs by nebulization every 6 (six) hours as needed.   360 mL   0   . predniSONE (DELTASONE) 10 MG tablet   Oral   Take 2 tablets (20 mg total) by mouth daily.   15 tablet   0    BP 118/61  Pulse 93  Temp(Src) 98 F (36.7 C) (Oral)  Resp 16  Wt 209 lb (94.802 kg)  SpO2 100%  LMP 03/27/2013  Breastfeeding? No Physical Exam  Constitutional: She appears well-developed.  HENT:  Head: Normocephalic and atraumatic.  Mouth/Throat: Oropharynx is clear and moist.  Neck: Normal range of motion.  Cardiovascular: Normal rate and regular rhythm.   Pulmonary/Chest: No respiratory distress. She has wheezes. She exhibits no tenderness.  Abdominal: Soft.  Musculoskeletal: Normal range of motion.  Skin: Skin is warm.    ED Course  Procedures (including critical care time) Labs Review Labs Reviewed  CBC - Abnormal; Notable for the following:    RBC 5.30 (*)    MCV 70.0 (*)    MCH 23.6 (*)    All other components within normal limits  BASIC METABOLIC PANEL - Abnormal;  Notable for the following:    Potassium 3.3 (*)    Glucose, Bld 115 (*)    All other components within normal limits  I-STAT TROPOININ, ED   Imaging Review Dg Chest 2 View (if Patient Has Fever And/or Copd)  04/22/2013   CLINICAL DATA:  Shortness of breath  EXAM: CHEST - 2 VIEW  COMPARISON:  12/16/2010  FINDINGS: Lungs are clear. Heart size and mediastinal contours are within normal limits. No effusion. Visualized skeletal structures are unremarkable.  IMPRESSION: No acute cardiopulmonary disease.   Electronically Signed   By: Arne Cleveland M.D.   On: 04/22/2013 18:19     EKG Interpretation None     patient has been using children's nebulizer as well as there to her meds, which you're a smaller dose than what she needs I will give her a prescription for adult strength to within the equipment, to attach to her nebulizer machine, in an adult size, as well as a prescription for prednisone  MDM   Final diagnoses:  URI (upper respiratory infection)  Asthma exacerbation         Garald Balding, NP 04/22/13 2251

## 2013-04-26 NOTE — ED Provider Notes (Signed)
Medical screening examination/treatment/procedure(s) were performed by non-physician practitioner and as supervising physician I was immediately available for consultation/collaboration.   EKG Interpretation   Date/Time:  Friday April 22 2013 17:30:48 EST Ventricular Rate:  103 PR Interval:  142 QRS Duration: 78 QT Interval:  324 QTC Calculation: 424 R Axis:   20 Text Interpretation:  Sinus tachycardia Cannot rule out Anterior infarct ,  age undetermined Abnormal ECG ED PHYSICIAN INTERPRETATION AVAILABLE IN  [facility] Confirmed by TEST, Record ([id number]) on 04/25/2013 2:27:36 PM        Tanna Furry, MD 04/26/13 1606

## 2013-07-20 ENCOUNTER — Encounter (HOSPITAL_COMMUNITY): Payer: Self-pay | Admitting: Emergency Medicine

## 2013-07-20 DIAGNOSIS — N949 Unspecified condition associated with female genital organs and menstrual cycle: Secondary | ICD-10-CM | POA: Insufficient documentation

## 2013-07-20 DIAGNOSIS — Z87891 Personal history of nicotine dependence: Secondary | ICD-10-CM | POA: Insufficient documentation

## 2013-07-20 DIAGNOSIS — J45909 Unspecified asthma, uncomplicated: Secondary | ICD-10-CM | POA: Insufficient documentation

## 2013-07-20 DIAGNOSIS — Z880 Allergy status to penicillin: Secondary | ICD-10-CM | POA: Insufficient documentation

## 2013-07-20 DIAGNOSIS — Z3202 Encounter for pregnancy test, result negative: Secondary | ICD-10-CM | POA: Insufficient documentation

## 2013-07-20 DIAGNOSIS — Z79899 Other long term (current) drug therapy: Secondary | ICD-10-CM | POA: Insufficient documentation

## 2013-07-20 LAB — COMPREHENSIVE METABOLIC PANEL
ALK PHOS: 72 U/L (ref 39–117)
ALT: 20 U/L (ref 0–35)
AST: 23 U/L (ref 0–37)
Albumin: 3.1 g/dL — ABNORMAL LOW (ref 3.5–5.2)
BUN: 17 mg/dL (ref 6–23)
CHLORIDE: 105 meq/L (ref 96–112)
CO2: 23 meq/L (ref 19–32)
Calcium: 8.8 mg/dL (ref 8.4–10.5)
Creatinine, Ser: 0.88 mg/dL (ref 0.50–1.10)
GFR calc Af Amer: >90 mL/min (ref >90)
GFR, EST NON AFRICAN AMERICAN: 87 mL/min — AB (ref >90)
Glucose, Bld: 117 mg/dL — ABNORMAL HIGH (ref 70–99)
Potassium: 4 mEq/L (ref 3.7–5.3)
Sodium: 140 mEq/L (ref 137–147)
Total Protein: 7.3 g/dL (ref 6.0–8.3)

## 2013-07-20 LAB — CBC WITH DIFFERENTIAL/PLATELET
BASOS ABS: 0 10*3/uL (ref 0.0–0.1)
BASOS PCT: 0 % (ref 0–1)
EOS PCT: 1 % (ref 0–5)
Eosinophils Absolute: 0.1 10*3/uL (ref 0.0–0.7)
HEMATOCRIT: 37.9 % (ref 36.0–46.0)
HEMOGLOBIN: 12.5 g/dL (ref 12.0–15.0)
Lymphocytes Relative: 26 % (ref 12–46)
Lymphs Abs: 2.7 10*3/uL (ref 0.7–4.0)
MCH: 23.5 pg — ABNORMAL LOW (ref 26.0–34.0)
MCHC: 33 g/dL (ref 30.0–36.0)
MCV: 71.4 fL — AB (ref 78.0–100.0)
MONOS PCT: 6 % (ref 3–12)
Monocytes Absolute: 0.6 10*3/uL (ref 0.1–1.0)
NEUTROS ABS: 6.8 10*3/uL (ref 1.7–7.7)
Neutrophils Relative %: 67 % (ref 43–77)
Platelets: 188 10*3/uL (ref 150–400)
RBC: 5.31 MIL/uL — ABNORMAL HIGH (ref 3.87–5.11)
RDW: 13.8 % (ref 11.5–15.5)
WBC: 10.2 10*3/uL (ref 4.0–10.5)

## 2013-07-20 LAB — LIPASE, BLOOD: LIPASE: 31 U/L (ref 11–59)

## 2013-07-20 NOTE — ED Notes (Signed)
Pt. reports RLQ pain with nausea and vomitting onset Monday .

## 2013-07-21 ENCOUNTER — Emergency Department (HOSPITAL_COMMUNITY)
Admission: EM | Admit: 2013-07-21 | Discharge: 2013-07-21 | Disposition: A | Discharge status: Home / Self Care | Payer: Medicaid Other | Attending: Emergency Medicine | Admitting: Emergency Medicine

## 2013-07-21 ENCOUNTER — Emergency Department (HOSPITAL_COMMUNITY): Payer: Medicaid Other

## 2013-07-21 DIAGNOSIS — R102 Pelvic and perineal pain: Secondary | ICD-10-CM

## 2013-07-21 LAB — WET PREP, GENITAL
Trich, Wet Prep: NONE SEEN
Yeast Wet Prep HPF POC: NONE SEEN

## 2013-07-21 LAB — URINALYSIS, ROUTINE W REFLEX MICROSCOPIC
BILIRUBIN URINE: NEGATIVE
Glucose, UA: NEGATIVE mg/dL
HGB URINE DIPSTICK: NEGATIVE
Ketones, ur: NEGATIVE mg/dL
Leukocytes, UA: NEGATIVE
NITRITE: NEGATIVE
Protein, ur: NEGATIVE mg/dL
Specific Gravity, Urine: 1.037 — ABNORMAL HIGH (ref 1.005–1.030)
UROBILINOGEN UA: 0.2 mg/dL (ref 0.0–1.0)
pH: 5.5 (ref 5.0–8.0)

## 2013-07-21 LAB — GC/CHLAMYDIA PROBE AMP
CT PROBE, AMP APTIMA: NEGATIVE
GC PROBE AMP APTIMA: NEGATIVE

## 2013-07-21 LAB — HIV ANTIBODY (ROUTINE TESTING W REFLEX): HIV: NONREACTIVE

## 2013-07-21 LAB — PREGNANCY, URINE: PREG TEST UR: NEGATIVE

## 2013-07-21 NOTE — ED Notes (Signed)
Pt A&OX4, ambulatory at discharge with slow, steady gait, NAD. 

## 2013-07-21 NOTE — ED Provider Notes (Signed)
CSN: [id number]     Arrival date & time 07/20/13  2124 History   First MD Initiated Contact with Patient 07/21/13 0226     Chief Complaint  Patient presents with  . Abdominal Pain     (Consider location/radiation/quality/duration/timing/severity/associated sxs/prior Treatment) HPI This is a 33 year old female with a three-day history of right suprapubic pain. The pain is intermittent and sharp. It is moderate in severity but sometimes is worse. It is exacerbated by sitting upright. It is also worse with palpation but not with ambulation. She denies vaginal bleeding, vaginal discharge, dysuria, fever, chills, nausea, vomiting or diarrhea. She has been self-medicating with Tylenol and ibuprofen with partial relief.  Past Medical History  Diagnosis Date  . Asthma    Past Surgical History  Procedure Laterality Date  . Cesarean section     No family history on file. History  Substance Use Topics  . Smoking status: Former Research scientist (life sciences)  . Smokeless tobacco: Not on file  . Alcohol Use: Yes     Comment: occ   OB History   Grav Para Term Preterm Abortions TAB SAB Ect Mult Living   1 1 1             Review of Systems  All other systems reviewed and are negative.   Allergies  Percocet; Nerve block tray; Penicillins; and Amoxicillin  Home Medications   Prior to Admission medications   Medication Sig Start Date End Date Taking? Authorizing Provider  albuterol (PROVENTIL) (2.5 MG/3ML) 0.083% nebulizer solution Take 2.5 mg by nebulization every 6 (six) hours as needed for wheezing or shortness of breath.   Yes Historical Provider, MD  ipratropium-albuterol (DUONEB) 0.5-2.5 (3) MG/3ML SOLN Take 3 mLs by nebulization every 6 (six) hours as needed (shortness of breath/wheezing).   Yes Historical Provider, MD  Prenatal Vit-Fe Fumarate-FA (MULTIVITAMIN-PRENATAL) 27-0.8 MG TABS Take 1 tablet by mouth daily.   Yes Historical Provider, MD   BP 95/62  Pulse 76  Temp(Src) 98.6 F (37 C) (Oral)   Resp 20  SpO2 99%  Physical Exam General: Well-developed, well-nourished female in no acute distress; appearance consistent with age of record HENT: normocephalic; atraumatic Eyes: pupils equal, round and reactive to light; extraocular muscles intact Neck: supple Heart: regular rate and rhythm Lungs: clear to auscultation bilaterally Abdomen: soft; nondistended; right suprapubic tenderness; no masses or hepatosplenomegaly; bowel sounds present GU: Normal external genitalia; no vaginal bleeding; no vaginal discharge; no cervical motion tenderness; right adnexal tenderness; Nuva ring in place Extremities: No deformity; full range of motion; pulses normal Neurologic: Awake, alert and oriented; motor function intact in all extremities and symmetric; no facial droop Skin: Warm and dry Psychiatric: Normal mood and affect    ED Course  Procedures (including critical care time)  MDM   Nursing notes and vitals signs, including pulse oximetry, reviewed.  Summary of this visit's results, reviewed by myself:  Labs:  Results for orders placed during the hospital encounter of 07/21/13 (from the past 24 hour(s))  CBC WITH DIFFERENTIAL     Status: Abnormal   Collection Time    07/20/13  9:51 PM      Result Value Ref Range   WBC 10.2  4.0 - 10.5 K/uL   RBC 5.31 (*) 3.87 - 5.11 MIL/uL   Hemoglobin 12.5  12.0 - 15.0 g/dL   HCT 37.9  36.0 - 46.0 %   MCV 71.4 (*) 78.0 - 100.0 fL   MCH 23.5 (*) 26.0 - 34.0 pg   MCHC 33.0  30.0 - 36.0 g/dL   RDW 13.8  11.5 - 15.5 %   Platelets 188  150 - 400 K/uL   Neutrophils Relative % 67  43 - 77 %   Lymphocytes Relative 26  12 - 46 %   Monocytes Relative 6  3 - 12 %   Eosinophils Relative 1  0 - 5 %   Basophils Relative 0  0 - 1 %   Neutro Abs 6.8  1.7 - 7.7 K/uL   Lymphs Abs 2.7  0.7 - 4.0 K/uL   Monocytes Absolute 0.6  0.1 - 1.0 K/uL   Eosinophils Absolute 0.1  0.0 - 0.7 K/uL   Basophils Absolute 0.0  0.0 - 0.1 K/uL   Smear Review LARGE PLATELETS  PRESENT    COMPREHENSIVE METABOLIC PANEL     Status: Abnormal   Collection Time    07/20/13  9:51 PM      Result Value Ref Range   Sodium 140  137 - 147 mEq/L   Potassium 4.0  3.7 - 5.3 mEq/L   Chloride 105  96 - 112 mEq/L   CO2 23  19 - 32 mEq/L   Glucose, Bld 117 (*) 70 - 99 mg/dL   BUN 17  6 - 23 mg/dL   Creatinine, Ser 0.88  0.50 - 1.10 mg/dL   Calcium 8.8  8.4 - 10.5 mg/dL   Total Protein 7.3  6.0 - 8.3 g/dL   Albumin 3.1 (*) 3.5 - 5.2 g/dL   AST 23  0 - 37 U/L   ALT 20  0 - 35 U/L   Alkaline Phosphatase 72  39 - 117 U/L   Total Bilirubin <0.2 (*) 0.3 - 1.2 mg/dL   GFR calc non Af Amer 87 (*) >90 mL/min   GFR calc Af Amer >90  >90 mL/min  LIPASE, BLOOD     Status: None   Collection Time    07/20/13  9:51 PM      Result Value Ref Range   Lipase 31  11 - 59 U/L  PREGNANCY, URINE     Status: None   Collection Time    Aug 06, 2013  1:04 AM      Result Value Ref Range   Preg Test, Ur NEGATIVE  NEGATIVE  URINALYSIS, ROUTINE W REFLEX MICROSCOPIC     Status: Abnormal   Collection Time    08/06/2013  1:04 AM      Result Value Ref Range   Color, Urine YELLOW  YELLOW   APPearance CLEAR  CLEAR   Specific Gravity, Urine 1.037 (*) 1.005 - 1.030   pH 5.5  5.0 - 8.0   Glucose, UA NEGATIVE  NEGATIVE mg/dL   Hgb urine dipstick NEGATIVE  NEGATIVE   Bilirubin Urine NEGATIVE  NEGATIVE   Ketones, ur NEGATIVE  NEGATIVE mg/dL   Protein, ur NEGATIVE  NEGATIVE mg/dL   Urobilinogen, UA 0.2  0.0 - 1.0 mg/dL   Nitrite NEGATIVE  NEGATIVE   Leukocytes, UA NEGATIVE  NEGATIVE  WET PREP, GENITAL     Status: Abnormal   Collection Time    Aug 06, 2013  2:40 AM      Result Value Ref Range   Yeast Wet Prep HPF POC NONE SEEN  NONE SEEN   Trich, Wet Prep NONE SEEN  NONE SEEN   Clue Cells Wet Prep HPF POC FEW (*) NONE SEEN   WBC, Wet Prep HPF POC MANY (*) NONE SEEN    Imaging Studies: US Transvaginal Non-ob  08/06/2013  CLINICAL DATA:  Right-sided pelvic pain.  EXAM: TRANSABDOMINAL AND TRANSVAGINAL  ULTRASOUND OF PELVIS  TECHNIQUE: Both transabdominal and transvaginal ultrasound examinations of the pelvis were performed. Transabdominal technique was performed for global imaging of the pelvis including uterus, ovaries, adnexal regions, and pelvic cul-de-sac. It was necessary to proceed with endovaginal exam following the transabdominal exam to visualize the uterus and ovaries in greater detail.  COMPARISON:  Pelvic ultrasound performed 05/01/2010  FINDINGS: Uterus  Measurements: 9.3 x 4.6 x 5.6 cm. A small partially calcified myometrial fibroid is noted anteriorly, measuring 2.5 x 1.9 x 1.8 cm.  Endometrium  Thickness: 0.7 cm.  No focal abnormality visualized.  Right ovary  Measurements: 3.5 x 1.5 x 1.9 cm. Normal appearance/no adnexal mass.  Left ovary  Measurements: 2.7 x 1.9 x 1.6 cm. Normal appearance/no adnexal mass.  Other findings  A small amount of free fluid is noted within the pelvic cul-de-sac.  IMPRESSION: 1. Small partially calcified myometrial fibroid noted within the uterus. 2. Otherwise unremarkable pelvic ultrasound. No evidence of ovarian torsion.   Electronically Signed   By: Garald Balding M.D.   On: 07/21/2013 04:51   US Pelvis Complete  07/21/2013   CLINICAL DATA:  Right-sided pelvic pain.  EXAM: TRANSABDOMINAL AND TRANSVAGINAL ULTRASOUND OF PELVIS  TECHNIQUE: Both transabdominal and transvaginal ultrasound examinations of the pelvis were performed. Transabdominal technique was performed for global imaging of the pelvis including uterus, ovaries, adnexal regions, and pelvic cul-de-sac. It was necessary to proceed with endovaginal exam following the transabdominal exam to visualize the uterus and ovaries in greater detail.  COMPARISON:  Pelvic ultrasound performed 05/01/2010  FINDINGS: Uterus  Measurements: 9.3 x 4.6 x 5.6 cm. A small partially calcified myometrial fibroid is noted anteriorly, measuring 2.5 x 1.9 x 1.8 cm.  Endometrium  Thickness: 0.7 cm.  No focal abnormality visualized.   Right ovary  Measurements: 3.5 x 1.5 x 1.9 cm. Normal appearance/no adnexal mass.  Left ovary  Measurements: 2.7 x 1.9 x 1.6 cm. Normal appearance/no adnexal mass.  Other findings  A small amount of free fluid is noted within the pelvic cul-de-sac.  IMPRESSION: 1. Small partially calcified myometrial fibroid noted within the uterus. 2. Otherwise unremarkable pelvic ultrasound. No evidence of ovarian torsion.   Electronically Signed   By: Garald Balding M.D.   On: 07/21/2013 04:51   5:29 AM Patient sleeping but readily arousable. She states her pain is only about a 3/10. She was advised of reassuring labs and ultrasound findings. She has an OB/GYN with whom she can followup.     Karen Chafe Laronn Devonshire, MD 07/21/13 0530

## 2013-07-21 NOTE — ED Notes (Signed)
Pelvic cart set up and at bedside. 

## 2013-08-06 ENCOUNTER — Emergency Department (HOSPITAL_COMMUNITY)
Admission: EM | Admit: 2013-08-06 | Discharge: 2013-08-06 | Disposition: A | Discharge status: Home / Self Care | Payer: Medicaid Other | Attending: Emergency Medicine | Admitting: Emergency Medicine

## 2013-08-06 ENCOUNTER — Other Ambulatory Visit: Payer: Self-pay

## 2013-08-06 ENCOUNTER — Encounter (HOSPITAL_COMMUNITY): Payer: Self-pay | Admitting: Emergency Medicine

## 2013-08-06 ENCOUNTER — Emergency Department (HOSPITAL_COMMUNITY): Payer: Medicaid Other

## 2013-08-06 DIAGNOSIS — Z87891 Personal history of nicotine dependence: Secondary | ICD-10-CM | POA: Insufficient documentation

## 2013-08-06 DIAGNOSIS — J45909 Unspecified asthma, uncomplicated: Secondary | ICD-10-CM | POA: Insufficient documentation

## 2013-08-06 DIAGNOSIS — Z791 Long term (current) use of non-steroidal anti-inflammatories (NSAID): Secondary | ICD-10-CM | POA: Insufficient documentation

## 2013-08-06 DIAGNOSIS — Z880 Allergy status to penicillin: Secondary | ICD-10-CM | POA: Insufficient documentation

## 2013-08-06 DIAGNOSIS — Z79899 Other long term (current) drug therapy: Secondary | ICD-10-CM | POA: Insufficient documentation

## 2013-08-06 DIAGNOSIS — M940 Chondrocostal junction syndrome [Tietze]: Secondary | ICD-10-CM | POA: Insufficient documentation

## 2013-08-06 DIAGNOSIS — R112 Nausea with vomiting, unspecified: Secondary | ICD-10-CM | POA: Insufficient documentation

## 2013-08-06 LAB — CBC
HCT: 36.4 % (ref 36.0–46.0)
HEMOGLOBIN: 12.1 g/dL (ref 12.0–15.0)
MCH: 23.4 pg — AB (ref 26.0–34.0)
MCHC: 33.2 g/dL (ref 30.0–36.0)
MCV: 70.5 fL — ABNORMAL LOW (ref 78.0–100.0)
PLATELETS: 210 10*3/uL (ref 150–400)
RBC: 5.16 MIL/uL — AB (ref 3.87–5.11)
RDW: 13.9 % (ref 11.5–15.5)
WBC: 11.2 10*3/uL — ABNORMAL HIGH (ref 4.0–10.5)

## 2013-08-06 LAB — BASIC METABOLIC PANEL
BUN: 10 mg/dL (ref 6–23)
CO2: 23 mEq/L (ref 19–32)
Calcium: 8.8 mg/dL (ref 8.4–10.5)
Chloride: 107 mEq/L (ref 96–112)
Creatinine, Ser: 0.85 mg/dL (ref 0.50–1.10)
GFR calc Af Amer: >90 mL/min (ref >90)
GFR calc non Af Amer: >90 mL/min (ref >90)
GLUCOSE: 108 mg/dL — AB (ref 70–99)
POTASSIUM: 3.8 meq/L (ref 3.7–5.3)
Sodium: 144 mEq/L (ref 137–147)

## 2013-08-06 LAB — I-STAT TROPONIN, ED: Troponin i, poc: 0.03 ng/mL (ref 0.00–0.08)

## 2013-08-06 MED ORDER — NAPROXEN 500 MG PO TABS: 500.0000 mg | ORAL_TABLET | Freq: Two times a day (BID) | ORAL | Status: DC

## 2013-08-06 NOTE — Discharge Instructions (Signed)
Please read and follow all provided instructions.  Your diagnoses today include:  1. Costochondritis    Tests performed today include:  An EKG of your heart  A chest x-ray  Cardiac enzymes - a blood test for heart muscle damage  Blood counts and electrolytes  Vital signs. See below for your results today.   Medications prescribed:   Naproxen - anti-inflammatory pain medication  Do not exceed 500mg  naproxen every 12 hours, take with food  You have been prescribed an anti-inflammatory medication or NSAID. Take with food. Take smallest effective dose for the shortest duration needed for your pain. Stop taking if you experience stomach pain or vomiting.   Take any prescribed medications only as directed.  Follow-up instructions: Please follow-up with your primary care provider as soon as you can for further evaluation of your symptoms. If you do not have a primary care doctor -- see below for referral information.   Return instructions:  SEEK IMMEDIATE MEDICAL ATTENTION IF:  You have severe chest pain, especially if the pain is crushing or pressure-like and spreads to the arms, back, neck, or jaw, or if you have sweating, nausea (feeling sick to your stomach), or shortness of breath. THIS IS AN EMERGENCY. Don't wait to see if the pain will go away. Get medical help at once. Call 911 or 0 (operator). DO NOT drive yourself to the hospital.   Your chest pain gets worse and does not go away with rest.   You have an attack of chest pain lasting longer than usual, despite rest and treatment with the medications your caregiver has prescribed.   You wake from sleep with chest pain or shortness of breath.  You feel dizzy or faint.  You have chest pain not typical of your usual pain for which you originally saw your caregiver.   You have any other emergent concerns regarding your health.  Additional Information: Chest pain comes from many different causes. Your caregiver has  diagnosed you as having chest pain that is not specific for one problem, but does not require admission.  You are at low risk for an acute heart condition or other serious illness.   Your vital signs today were: BP 112/74   Pulse 79   Temp(Src) 98.2 F (36.8 C)   Resp 18   Wt 218 lb (98.884 kg)   SpO2 98% If your blood pressure (BP) was elevated above 135/85 this visit, please have this repeated by your doctor within one month. --------------

## 2013-08-06 NOTE — ED Provider Notes (Signed)
CSN: [id number]     Arrival date & time 08/06/13  1241 History   First MD Initiated Contact with Patient 08/06/13 1404     Chief Complaint  Patient presents with  . Chest Pain     (Consider location/radiation/quality/duration/timing/severity/associated sxs/prior Treatment) HPI Comments: Patient presents with chief complaint of chest pain, waxing and waning, since acute onset at 12 AM today. Patient describes a sharp stabbing pain in the left side of her breast bone it is much worse with movement of her left arm, reaching with her left arm. Pain was so bad that she vomited twice this morning. Patient had an aspirin last night. Patient denies acute injury although she admits to lifting a 59-year-old child frequently during the day, putting together a bed yesterday. No treatments prior to arrival. She denies fever, cough. She has shortness of breath only when the pain is severe. No abdominal pain.Nuvaring birth control. Patient states she was on lovenox during pregnancy but can't remember why. She denies unilateral leg swelling, recent immobilizations, recent surgery, recent travel (>4hr segment), malignancy, hemoptysis. No h/o ACS, HTN, high cholesterol, DM.     Patient is a 33 y.o. female presenting with chest pain. The history is provided by the patient.  Chest Pain Associated symptoms: nausea and vomiting   Associated symptoms: no abdominal pain, no back pain, no cough, no diaphoresis, no fever, no palpitations and no shortness of breath     Past Medical History  Diagnosis Date  . Asthma    Past Surgical History  Procedure Laterality Date  . Cesarean section     History reviewed. No pertinent family history. History  Substance Use Topics  . Smoking status: Former Research scientist (life sciences)  . Smokeless tobacco: Not on file  . Alcohol Use: Yes     Comment: occ   OB History   Grav Para Term Preterm Abortions TAB SAB Ect Mult Living   1 1 1             Review of Systems  Constitutional: Negative  for fever and diaphoresis.  Eyes: Negative for redness.  Respiratory: Negative for cough and shortness of breath.   Cardiovascular: Positive for chest pain. Negative for palpitations and leg swelling.  Gastrointestinal: Positive for nausea and vomiting. Negative for abdominal pain.  Genitourinary: Negative for dysuria.  Musculoskeletal: Negative for back pain and neck pain.  Skin: Negative for rash.  Neurological: Negative for syncope and light-headedness.      Allergies  Percocet; Nerve block tray; Penicillins; and Amoxicillin  Home Medications   Prior to Admission medications   Medication Sig Start Date End Date Taking? Authorizing Provider  albuterol (PROVENTIL HFA;VENTOLIN HFA) 108 (90 BASE) MCG/ACT inhaler Inhale into the lungs every 6 (six) hours as needed for wheezing or shortness of breath.   Yes Historical Provider, MD  etonogestrel-ethinyl estradiol (NUVARING) 0.12-0.015 MG/24HR vaginal ring Place 1 each vaginally every 28 (twenty-eight) days. Insert vaginally and leave in place for 3 consecutive weeks, then remove for 1 week.   Yes Historical Provider, MD  ipratropium-albuterol (DUONEB) 0.5-2.5 (3) MG/3ML SOLN Take 3 mLs by nebulization every 6 (six) hours as needed (shortness of breath/wheezing).   Yes Historical Provider, MD  omeprazole (PRILOSEC OTC) 20 MG tablet Take 20 mg by mouth daily.   Yes Historical Provider, MD  Prenatal Vit-Fe Fumarate-FA (MULTIVITAMIN-PRENATAL) 27-0.8 MG TABS Take 1 tablet by mouth daily.   Yes Historical Provider, MD  naproxen (NAPROSYN) 500 MG tablet Take 1 tablet (500 mg total) by mouth 2 (  two) times daily. 08/06/13   Carlisle Cater, PA-C   BP 112/74  Pulse 79  Temp(Src) 98.2 F (36.8 C)  Resp 18  Wt 218 lb (98.884 kg)  SpO2 98% Physical Exam  Nursing note and vitals reviewed. Constitutional: She appears well-developed and well-nourished.  HENT:  Head: Normocephalic and atraumatic.  Eyes: Conjunctivae are normal. Right eye exhibits no  discharge. Left eye exhibits no discharge.  Neck: Normal range of motion. Neck supple.  Cardiovascular: Normal rate, regular rhythm and normal heart sounds.   No murmur heard. Pulmonary/Chest: Effort normal and breath sounds normal. No respiratory distress. She has no wheezes. She has no rales. She exhibits tenderness.    Abdominal: Soft. There is no tenderness. There is no rebound and no guarding.  Neurological: She is alert.  Skin: Skin is warm and dry.  Psychiatric: She has a normal mood and affect.    ED Course  Procedures (including critical care time) Labs Review Labs Reviewed  CBC - Abnormal; Notable for the following:    WBC 11.2 (*)    RBC 5.16 (*)    MCV 70.5 (*)    MCH 23.4 (*)    All other components within normal limits  BASIC METABOLIC PANEL - Abnormal; Notable for the following:    Glucose, Bld 108 (*)    All other components within normal limits  I-STAT TROPOININ, ED    Imaging Review Dg Chest 2 View  08/06/2013   CLINICAL DATA:  Chest pain  EXAM: CHEST  2 VIEW  COMPARISON:  04/22/2013  FINDINGS: Cardiomediastinal silhouette is stable. No acute infiltrate or pleural effusion. No pulmonary edema. Bony thorax is unremarkable.  IMPRESSION: No active cardiopulmonary disease.   Electronically Signed   By: Lahoma Crocker M.D.   On: 08/06/2013 14:09     EKG Interpretation None      Patient seen and examined. Chest x-ray, EKG, and blood work reviewed.    Vital signs reviewed and are as follows: Filed Vitals:   08/06/13 1445  BP: 110/79  Pulse: 84  Temp:   Resp: 18   Will treat for costochondritis.  Patient was counseled to return with severe chest pain, especially if the pain is crushing or pressure-like and spreads to the arms, back, neck, or jaw, or if they have sweating, nausea, or shortness of breath with the pain. They were encouraged to call 911 with these symptoms.   They were also told to return if their chest pain gets worse and does not go away with  rest, they have an attack of chest pain lasting longer than usual despite rest and treatment with the medications their caregiver has prescribed, if they wake from sleep with chest pain or shortness of breath, if they feel dizzy or faint, if they have chest pain not typical of their usual pain, or if they have any other emergent concerns regarding their health.  The patient verbalized understanding and agreed.     MDM   Final diagnoses:  Costochondritis   Patient with reproducible left sternal border pain to palpation exacerbated with outstretched left arm. Patient is not PERC negative, however I do not suspect pulmonary embolism given this exam which is extremely suggestive of costochondritis. Patient repeatedly lifts a 33-year-old child during the day and I suspect overuse injury from this motion. No persistent shortness of breath. Mild tachycardia upon arrival however during exam patient's heart rate is steady in the 70s. No significant risk factors or history suggestive of ACS. Will treat  with anti-inflammatories. Appropriate return instructions discussed with patient at bedside.  No dangerous or life-threatening conditions suspected or identified by history, physical exam, and by work-up. No indications for hospitalization identified.      Carlisle Cater, PA-C 08/06/13 [phone]

## 2013-08-06 NOTE — ED Notes (Signed)
Radiology called and asked to bring pt to D31 when completed xray.

## 2013-08-06 NOTE — ED Notes (Signed)
REPORTS WHEN REACHING ABOVE LEVEL OF CHEST WITH LEFT ARM THAT SHE DEVELOPS MORE PAIN, LIKE A STABBING PAIN.  REPORTS PUTTING CHILD'S BED TOGETHER YESTERDAY, NOT SURE IF SHE PULLED SOMETHING.

## 2013-08-06 NOTE — ED Provider Notes (Signed)
Medical screening examination/treatment/procedure(s) were performed by non-physician practitioner and as supervising physician I was immediately available for consultation/collaboration.   EKG Interpretation   Date/Time:  Saturday August 06 2013 12:46:31 EDT Ventricular Rate:  113 PR Interval:  146 QRS Duration: 78 QT Interval:  344 QTC Calculation: 471 R Axis:   22 Text Interpretation:  Sinus tachycardia Cannot rule out Anterior infarct ,  age undetermined Abnormal ECG No significant change since last tracing  Confirmed by Alvino Chapel  MD, Jex Strausbaugh [phone]) on 08/06/2013 4:02:33 PM       Jasper Riling. Alvino Chapel, MD 08/06/13 [phone]

## 2013-08-06 NOTE — ED Notes (Signed)
Per pt sts chest pain and nausea that has been intermittent since last night. Denise cough, fever. Denies SOB unless pain is intense.

## 2013-11-23 ENCOUNTER — Encounter (INDEPENDENT_AMBULATORY_CARE_PROVIDER_SITE_OTHER): Payer: Self-pay

## 2013-11-23 ENCOUNTER — Ambulatory Visit
Admission: RE | Admit: 2013-11-23 | Discharge: 2013-11-23 | Disposition: A | Discharge status: Home / Self Care | Payer: Medicaid Other | Source: Ambulatory Visit | Attending: Family | Admitting: Family

## 2013-11-23 ENCOUNTER — Other Ambulatory Visit: Payer: Self-pay | Admitting: Family

## 2013-11-23 DIAGNOSIS — M25562 Pain in left knee: Secondary | ICD-10-CM

## 2013-12-26 ENCOUNTER — Encounter (HOSPITAL_COMMUNITY): Payer: Self-pay | Admitting: Emergency Medicine

## 2014-09-11 ENCOUNTER — Emergency Department (HOSPITAL_COMMUNITY)
Admission: EM | Admit: 2014-09-11 | Discharge: 2014-09-11 | Disposition: A | Discharge status: Home / Self Care | Payer: Self-pay | Attending: Emergency Medicine | Admitting: Emergency Medicine

## 2014-09-11 ENCOUNTER — Encounter (HOSPITAL_COMMUNITY): Payer: Self-pay | Admitting: *Deleted

## 2014-09-11 DIAGNOSIS — Z880 Allergy status to penicillin: Secondary | ICD-10-CM | POA: Insufficient documentation

## 2014-09-11 DIAGNOSIS — Y929 Unspecified place or not applicable: Secondary | ICD-10-CM | POA: Insufficient documentation

## 2014-09-11 DIAGNOSIS — Y939 Activity, unspecified: Secondary | ICD-10-CM | POA: Insufficient documentation

## 2014-09-11 DIAGNOSIS — Z87891 Personal history of nicotine dependence: Secondary | ICD-10-CM | POA: Insufficient documentation

## 2014-09-11 DIAGNOSIS — Z79899 Other long term (current) drug therapy: Secondary | ICD-10-CM | POA: Insufficient documentation

## 2014-09-11 DIAGNOSIS — Y999 Unspecified external cause status: Secondary | ICD-10-CM | POA: Insufficient documentation

## 2014-09-11 DIAGNOSIS — Z791 Long term (current) use of non-steroidal anti-inflammatories (NSAID): Secondary | ICD-10-CM | POA: Insufficient documentation

## 2014-09-11 DIAGNOSIS — J45909 Unspecified asthma, uncomplicated: Secondary | ICD-10-CM | POA: Insufficient documentation

## 2014-09-11 DIAGNOSIS — M542 Cervicalgia: Secondary | ICD-10-CM | POA: Insufficient documentation

## 2014-09-11 DIAGNOSIS — X58XXXA Exposure to other specified factors, initial encounter: Secondary | ICD-10-CM | POA: Insufficient documentation

## 2014-09-11 DIAGNOSIS — S29012A Strain of muscle and tendon of back wall of thorax, initial encounter: Secondary | ICD-10-CM | POA: Insufficient documentation

## 2014-09-11 DIAGNOSIS — T148XXA Other injury of unspecified body region, initial encounter: Secondary | ICD-10-CM

## 2014-09-11 MED ORDER — IBUPROFEN 800 MG PO TABS: 800.0000 mg | ORAL_TABLET | Freq: Three times a day (TID) | ORAL | Status: DC

## 2014-09-11 MED ORDER — CYCLOBENZAPRINE HCL 10 MG PO TABS: 10.0000 mg | ORAL_TABLET | Freq: Two times a day (BID) | ORAL | Status: DC | PRN

## 2014-09-11 NOTE — Discharge Instructions (Signed)

## 2014-09-11 NOTE — ED Provider Notes (Signed)
CSN: [id number]     Arrival date & time 09/11/14  1410 History  This chart was scribed for non-physician practitioner, Glendell Docker, NP, working with Leonard Schwartz, MD, by Helane Gunther ED Scribe. This patient was seen in room TR10C/TR10C and the patient's care was started at 2:24 PM    Chief Complaint  Patient presents with  . Back Pain   The history is provided by the patient. No language interpreter was used.   HPI Comments: Kristen [name] is a 34 y.o. female who presents to the Emergency Department complaining of constant, aching back pain onset last night after lifting a heavy laundry basket. She states the pain began in her right shoulder, and then began to radiate to her back by the morning. Denies numbness or weakness.no fever. Tried aleve.  Past Medical History  Diagnosis Date  . Asthma    Past Surgical History  Procedure Laterality Date  . Cesarean section     No family history on file. History  Substance Use Topics  . Smoking status: Former Research scientist (life sciences)  . Smokeless tobacco: Not on file  . Alcohol Use: Yes     Comment: occ   OB History    Gravida Para Term Preterm AB TAB SAB Ectopic Multiple Living   1 1 1             Review of Systems  Musculoskeletal: Positive for myalgias and back pain.  All other systems reviewed and are negative.   Allergies  Percocet; Nerve block tray; Penicillins; and Amoxicillin  Home Medications   Prior to Admission medications   Medication Sig Start Date End Date Taking? Authorizing Provider  albuterol (PROVENTIL HFA;VENTOLIN HFA) 108 (90 BASE) MCG/ACT inhaler Inhale into the lungs every 6 (six) hours as needed for wheezing or shortness of breath.    Historical Provider, MD  etonogestrel-ethinyl estradiol (NUVARING) 0.12-0.015 MG/24HR vaginal ring Place 1 each vaginally every 28 (twenty-eight) days. Insert vaginally and leave in place for 3 consecutive weeks, then remove for 1 week.    Historical Provider, MD   ipratropium-albuterol (DUONEB) 0.5-2.5 (3) MG/3ML SOLN Take 3 mLs by nebulization every 6 (six) hours as needed (shortness of breath/wheezing).    Historical Provider, MD  naproxen (NAPROSYN) 500 MG tablet Take 1 tablet (500 mg total) by mouth 2 (two) times daily. 08/06/13   Carlisle Cater, PA-C  omeprazole (PRILOSEC OTC) 20 MG tablet Take 20 mg by mouth daily.    Historical Provider, MD  Prenatal Vit-Fe Fumarate-FA (MULTIVITAMIN-PRENATAL) 27-0.8 MG TABS Take 1 tablet by mouth daily.    Historical Provider, MD   BP 140/93 mmHg  Pulse 105  Temp(Src) 98.5 F (36.9 C) (Oral)  Resp 16  Ht 5\' 3"  (1.6 m)  Wt 240 lb (108.863 kg)  BMI 42.52 kg/m2  SpO2 98% Physical Exam  Constitutional: She is oriented to person, place, and time. She appears well-developed and well-nourished. No distress.  HENT:  Head: Normocephalic and atraumatic.  Mouth/Throat: Oropharynx is clear and moist.  Eyes: Conjunctivae and EOM are normal. Pupils are equal, round, and reactive to light.  Neck: Normal range of motion. Neck supple. No tracheal deviation present.  Cardiovascular: Normal rate.   Pulmonary/Chest: Breath sounds normal. No respiratory distress.  Abdominal: Soft.  Musculoskeletal: Normal range of motion.  Cervical paraspinal tenderness. Thoracic paraspinal tenderness. Good sensation and strength  Neurological: She is alert and oriented to person, place, and time. She exhibits normal muscle tone. Coordination normal.  Skin: Skin is warm and dry.  Psychiatric:  She has a normal mood and affect. Her behavior is normal.  Nursing note and vitals reviewed.   ED Course  Procedures  DIAGNOSTIC STUDIES: Oxygen Saturation is 98% on RA, normal by my interpretation.    COORDINATION OF CARE: 2:27 PM - Discussed plans to order antiinflammatory medication and muscle relaxant. Discussed possible side effects of muscle relaxants. Pt advised of plan for treatment and pt agrees.  Labs Review Labs Reviewed - No data to  display  Imaging Review No results found.   EKG Interpretation None      MDM   Final diagnoses:  Muscle strain    Don't think imaging is needed at this time. Pt is neurovascularly intact. Will send home with ibuprofen and flexeril  I personally performed the services described in this documentation, which was scribed in my presence. The recorded information has been reviewed and is accurate.   Glendell Docker, NP 09/11/14 [place], MD 09/16/14 2225

## 2014-09-11 NOTE — ED Notes (Signed)
Pt states that she was lifting boxes last night and feels like she pulled a muscle in her back and rt shoulder.

## 2014-09-11 NOTE — ED Notes (Signed)
Declined W/C at D/C and was escorted to lobby by RN. 

## 2015-01-19 DIAGNOSIS — J029 Acute pharyngitis, unspecified: Secondary | ICD-10-CM | POA: Insufficient documentation

## 2015-01-19 DIAGNOSIS — Z79899 Other long term (current) drug therapy: Secondary | ICD-10-CM | POA: Insufficient documentation

## 2015-01-19 DIAGNOSIS — J45909 Unspecified asthma, uncomplicated: Secondary | ICD-10-CM | POA: Diagnosis not present

## 2015-01-19 DIAGNOSIS — Z880 Allergy status to penicillin: Secondary | ICD-10-CM | POA: Insufficient documentation

## 2015-01-19 DIAGNOSIS — H6692 Otitis media, unspecified, left ear: Secondary | ICD-10-CM | POA: Insufficient documentation

## 2015-01-19 DIAGNOSIS — Z7984 Long term (current) use of oral hypoglycemic drugs: Secondary | ICD-10-CM | POA: Diagnosis not present

## 2015-01-19 DIAGNOSIS — Z87891 Personal history of nicotine dependence: Secondary | ICD-10-CM | POA: Diagnosis not present

## 2015-01-20 ENCOUNTER — Emergency Department (HOSPITAL_COMMUNITY)
Admission: EM | Admit: 2015-01-20 | Discharge: 2015-01-20 | Disposition: A | Discharge status: Home / Self Care | Payer: Medicaid Other | Attending: Emergency Medicine | Admitting: Emergency Medicine

## 2015-01-20 ENCOUNTER — Encounter (HOSPITAL_COMMUNITY): Payer: Self-pay | Admitting: *Deleted

## 2015-01-20 ENCOUNTER — Emergency Department (HOSPITAL_COMMUNITY): Payer: Medicaid Other

## 2015-01-20 DIAGNOSIS — H6692 Otitis media, unspecified, left ear: Secondary | ICD-10-CM

## 2015-01-20 DIAGNOSIS — J029 Acute pharyngitis, unspecified: Secondary | ICD-10-CM

## 2015-01-20 LAB — RAPID STREP SCREEN (MED CTR MEBANE ONLY): Streptococcus, Group A Screen (Direct): NEGATIVE

## 2015-01-20 MED ORDER — AZITHROMYCIN 250 MG PO TABS: 250.0000 mg | ORAL_TABLET | Freq: Every day | ORAL | Status: DC

## 2015-01-20 MED ORDER — IBUPROFEN 400 MG PO TABS
800.0000 mg | ORAL_TABLET | Freq: Once | ORAL | Status: AC
  Administered 2015-01-20: 800 mg via ORAL
  Filled 2015-01-20: qty 2

## 2015-01-20 MED ORDER — IBUPROFEN 800 MG PO TABS: 800.0000 mg | ORAL_TABLET | Freq: Three times a day (TID) | ORAL | Status: DC

## 2015-01-20 NOTE — ED Notes (Signed)
Pt ambulating independently w/ steady gait on d/c in no acute distress, A&Ox4. D/c instructions reviewed w/ pt and family - pt and family deny any further questions or concerns at present. Rx given x2  

## 2015-01-20 NOTE — ED Provider Notes (Signed)
CSN: [id number]     Arrival date & time 01/19/15  2348 History   First MD Initiated Contact with Patient 01/20/15 0008     Chief Complaint  Patient presents with  . Sore Throat     (Consider location/radiation/quality/duration/timing/severity/associated sxs/prior Treatment) HPI   Patient presents with 6/10, constant, progressively worsening sore throat and left ear pain x 1.5 days.  Nothing makes it worse.  Allergy medicine and cold medicine tried PTA with mild relief.  Patient able to eat and drink; however, swallowing is mildly painful.  Assoc sxs include SOB, nonproducitve cough, and wheezing.  Denies rhinorrhea, hearing loss, fever, neck stiffness, or watery eyes.  Past Medical History  Diagnosis Date  . Asthma    Past Surgical History  Procedure Laterality Date  . Cesarean section     History reviewed. No pertinent family history. Social History  Substance Use Topics  . Smoking status: Former Research scientist (life sciences)  . Smokeless tobacco: None  . Alcohol Use: Yes     Comment: occ   OB History    Gravida Para Term Preterm AB TAB SAB Ectopic Multiple Living   1 1 1             Review of Systems All other systems negative unless otherwise stated in HPI    Allergies  Percocet; Nerve block tray; Penicillins; and Amoxicillin  Home Medications   Prior to Admission medications   Medication Sig Start Date End Date Taking? Authorizing Provider  albuterol (PROVENTIL HFA;VENTOLIN HFA) 108 (90 BASE) MCG/ACT inhaler Inhale into the lungs every 6 (six) hours as needed for wheezing or shortness of breath.   Yes Historical Provider, MD  ipratropium-albuterol (DUONEB) 0.5-2.5 (3) MG/3ML SOLN Take 3 mLs by nebulization every 6 (six) hours as needed (shortness of breath/wheezing).   Yes Historical Provider, MD  metFORMIN (GLUCOPHAGE) 500 MG tablet Take 500 mg by mouth See admin instructions. Twice daily every two days.   Yes Historical Provider, MD  azithromycin (ZITHROMAX) 250 MG tablet Take 1  tablet (250 mg total) by mouth daily. Take first 2 tablets together, then 1 every day until finished. 01/20/15   Gloriann Loan, PA-C  ibuprofen (ADVIL,MOTRIN) 800 MG tablet Take 1 tablet (800 mg total) by mouth 3 (three) times daily. 01/20/15   Franciszek Platten, PA-C   BP 128/88 mmHg  Pulse 98  Temp(Src) 97.9 F (36.6 C) (Oral)  Resp 16  Ht 5\' 3"  (1.6 m)  Wt 115.667 kg  BMI 45.18 kg/m2  SpO2 98%  LMP 01/05/2015 Physical Exam  Constitutional: She is oriented to person, place, and time. She appears well-developed and well-nourished.  HENT:  Head: Normocephalic and atraumatic.  Right Ear: Hearing, tympanic membrane, external ear and ear canal normal.  Left Ear: Hearing and external ear normal. No drainage, swelling or tenderness. Tympanic membrane is injected, erythematous and bulging.  Nose: Nose normal.  Mouth/Throat: Uvula is midline, oropharynx is clear and moist and mucous membranes are normal. No trismus in the jaw. No uvula swelling. No oropharyngeal exudate, posterior oropharyngeal edema, posterior oropharyngeal erythema or tonsillar abscesses.  Eyes: Conjunctivae are normal. Pupils are equal, round, and reactive to light.  Neck: Normal range of motion. Neck supple.  Cardiovascular: Normal rate, regular rhythm and normal heart sounds.   No murmur heard. Pulmonary/Chest: Effort normal and breath sounds normal. No accessory muscle usage or stridor. No respiratory distress. She has no wheezes. She has no rhonchi. She has no rales.  Abdominal: Soft. Bowel sounds are normal. She exhibits no  distension. There is no tenderness.  Musculoskeletal: Normal range of motion.  Lymphadenopathy:    She has no cervical adenopathy.  Neurological: She is alert and oriented to person, place, and time.  Speech clear without dysarthria.  Skin: Skin is warm and dry.  Psychiatric: She has a normal mood and affect. Her behavior is normal.    ED Course  Procedures (including critical care time) Labs  Review Labs Reviewed  RAPID STREP SCREEN (NOT AT [facility])  CULTURE, GROUP A STREP    Imaging Review Dg Chest 2 View  01/20/2015  CLINICAL DATA:  Sore throat and ear pain beginning on Thursday. Nonproductive cough and shortness of breath. History of asthma. EXAM: CHEST  2 VIEW COMPARISON:  08/06/2013 FINDINGS: The heart size and mediastinal contours are within normal limits. Both lungs are clear. The visualized skeletal structures are unremarkable. IMPRESSION: No active cardiopulmonary disease. Electronically Signed   By: Lucienne Capers M.D.   On: 01/20/2015 01:29   I have personally reviewed and evaluated these images and lab results as part of my medical decision-making.   EKG Interpretation None      MDM   Final diagnoses:  Acute left otitis media, recurrence not specified, unspecified otitis media type  Pharyngitis    Patient presents with sore throat and left ear pain.  No fevers, no neck stiffness.  VSS, NAD.  On exam, uvula midline, no postoropharyngeal exudates or erythema.  No cervical lymphadenopathy.  Left TM erythematous and bulging.  No nuchal rigidity.  Heart RRR, lungs CTAB, abdomen soft and non-tender.  Will obtain CXR.  Motrin for pain.  Suspect pharyngitis.  Suspect AOM.  Doubt retropharyngeal abscess. Doubt epiglottitis.  Doubt peritonsillar abscess.  Evaluation does not show pathology requring ongoing emergent intervention or admission. Pt is hemodynamically stable and mentating appropriately. Discussed findings/results and plan with patient/guardian, who agrees with plan. All questions answered. Return precautions discussed and outpatient follow up given.       Gloriann Loan, PA-C 01/20/15 [id number]  Jola Schmidt, MD 01/20/15 [phone]

## 2015-01-20 NOTE — Discharge Instructions (Signed)
Otitis Media, Adult  Otitis media is redness, soreness, and inflammation of the middle ear. Otitis media may be caused by allergies or, most commonly, by infection. Often it occurs as a complication of the common cold.  SIGNS AND SYMPTOMS  Symptoms of otitis media may include:   Earache.   Fever.   Ringing in your ear.   Headache.   Leakage of fluid from the ear.  DIAGNOSIS  To diagnose otitis media, your health care provider will examine your ear with an otoscope. This is an instrument that allows your health care provider to see into your ear in order to examine your eardrum. Your health care provider also will ask you questions about your symptoms.  TREATMENT   Typically, otitis media resolves on its own within 3-5 days. Your health care provider may prescribe medicine to ease your symptoms of pain. If otitis media does not resolve within 5 days or is recurrent, your health care provider may prescribe antibiotic medicines if he or she suspects that a bacterial infection is the cause.  HOME CARE INSTRUCTIONS    If you were prescribed an antibiotic medicine, finish it all even if you start to feel better.   Take medicines only as directed by your health care provider.   Keep all follow-up visits as directed by your health care provider.  SEEK MEDICAL CARE IF:   You have otitis media only in one ear, or bleeding from your nose, or both.   You notice a lump on your neck.   You are not getting better in 3-5 days.   You feel worse instead of better.  SEEK IMMEDIATE MEDICAL CARE IF:    You have pain that is not controlled with medicine.   You have swelling, redness, or pain around your ear or stiffness in your neck.   You notice that part of your face is paralyzed.   You notice that the bone behind your ear (mastoid) is tender when you touch it.  MAKE SURE YOU:    Understand these instructions.   Will watch your condition.   Will get help right away if you are not doing well or get worse.     This  information is not intended to replace advice given to you by your health care provider. Make sure you discuss any questions you have with your health care provider.     Document Released: 11/16/2003 Document Revised: 03/03/2014 Document Reviewed: 09/07/2012  Elsevier Interactive Patient Education 2016 [employer]  Pharyngitis  Pharyngitis is redness, pain, and swelling (inflammation) of your pharynx.   CAUSES   Pharyngitis is usually caused by infection. Most of the time, these infections are from viruses (viral) and are part of a cold. However, sometimes pharyngitis is caused by bacteria (bacterial). Pharyngitis can also be caused by allergies. Viral pharyngitis may be spread from person to person by coughing, sneezing, and personal items or utensils (cups, forks, spoons, toothbrushes). Bacterial pharyngitis may be spread from person to person by more intimate contact, such as kissing.   SIGNS AND SYMPTOMS   Symptoms of pharyngitis include:    Sore throat.    Tiredness (fatigue).    Low-grade fever.    Headache.   Joint pain and muscle aches.   Skin rashes.   Swollen lymph nodes.   Plaque-like film on throat or tonsils (often seen with bacterial pharyngitis).  DIAGNOSIS   Your health care provider will ask you questions about your illness and your symptoms. Your medical history,   along with a physical exam, is often all that is needed to diagnose pharyngitis. Sometimes, a rapid strep test is done. Other lab tests may also be done, depending on the suspected cause.   TREATMENT   Viral pharyngitis will usually get better in 3-4 days without the use of medicine. Bacterial pharyngitis is treated with medicines that kill germs (antibiotics).   HOME CARE INSTRUCTIONS    Drink enough water and fluids to keep your urine clear or pale yellow.    Only take over-the-counter or prescription medicines as directed by your health care provider:    If you are prescribed antibiotics, make sure you finish them  even if you start to feel better.    Do not take aspirin.    Get lots of rest.    Gargle with 8 oz of salt water ( tsp of salt per 1 qt of water) as often as every 1-2 hours to soothe your throat.    Throat lozenges (if you are not at risk for choking) or sprays may be used to soothe your throat.  SEEK MEDICAL CARE IF:    You have large, tender lumps in your neck.   You have a rash.   You cough up green, yellow-brown, or bloody spit.  SEEK IMMEDIATE MEDICAL CARE IF:    Your neck becomes stiff.   You drool or are unable to swallow liquids.   You vomit or are unable to keep medicines or liquids down.   You have severe pain that does not go away with the use of recommended medicines.   You have trouble breathing (not caused by a stuffy nose).  MAKE SURE YOU:    Understand these instructions.   Will watch your condition.   Will get help right away if you are not doing well or get worse.     This information is not intended to replace advice given to you by your health care provider. Make sure you discuss any questions you have with your health care provider.     Document Released: 02/10/2005 Document Revised: 12/01/2012 Document Reviewed: 10/18/2012  Elsevier Interactive Patient Education 2016 Elsevier Inc.

## 2015-01-20 NOTE — ED Notes (Signed)
Pt reports sore throat and ear pain that began on Thursday, pt later began experiencing a non-productive cough and shortness of breath. Pt has used cold medications at home w/o relief.

## 2015-01-22 LAB — CULTURE, GROUP A STREP: Strep A Culture: NEGATIVE

## 2015-03-15 ENCOUNTER — Ambulatory Visit (INDEPENDENT_AMBULATORY_CARE_PROVIDER_SITE_OTHER): Payer: Medicaid Other | Admitting: Obstetrics

## 2015-03-15 ENCOUNTER — Encounter: Payer: Self-pay | Admitting: Obstetrics

## 2015-03-15 VITALS — BP 130/87 | HR 106 | Temp 98.2°F | Ht 63.5 in | Wt 250.0 lb

## 2015-03-15 DIAGNOSIS — N939 Abnormal uterine and vaginal bleeding, unspecified: Secondary | ICD-10-CM

## 2015-03-15 DIAGNOSIS — Z30011 Encounter for initial prescription of contraceptive pills: Secondary | ICD-10-CM

## 2015-03-15 MED ORDER — NORETHINDRONE-ETH ESTRADIOL 1-35 MG-MCG PO TABS: 1.0000 | ORAL_TABLET | Freq: Every day | ORAL | Status: DC

## 2015-03-15 NOTE — Progress Notes (Signed)
Patient ID: Kristen [name], female   DOB: December 17, 1980, 35 y.o.   MRN: [id number]  Chief Complaint  Patient presents with  . Menstrual Problem    Irregular periods    HPI Kristen [name] is a 35 y.o. female.  H/O irregular periods.  Cycles have been irregular since birth of last child.  No bleeding in between periods.  HPI  Past Medical History  Diagnosis Date  . Asthma     Past Surgical History  Procedure Laterality Date  . Cesarean section      Family History  Problem Relation Age of Onset  . HIV Father     Social History Social History  Substance Use Topics  . Smoking status: Former Research scientist (life sciences)  . Smokeless tobacco: Never Used  . Alcohol Use: No     Comment: occ    Allergies  Allergen Reactions  . Percocet [Oxycodone-Acetaminophen] Anaphylaxis  . Nerve Block Tray Itching and Other (See Comments)    burning  . Penicillins Hives  . Amoxicillin     Mood changes    Current Outpatient Prescriptions  Medication Sig Dispense Refill  . albuterol (PROVENTIL HFA;VENTOLIN HFA) 108 (90 BASE) MCG/ACT inhaler Inhale into the lungs every 6 (six) hours as needed for wheezing or shortness of breath.    Marland Kitchen buPROPion (WELLBUTRIN XL) 150 MG 24 hr tablet Take 150 mg by mouth daily.    . metFORMIN (GLUCOPHAGE) 500 MG tablet Take 500 mg by mouth See admin instructions. Twice daily every two days.    . norethindrone-ethinyl estradiol 1/35 ([place] 1/35, 28,) tablet Take 1 tablet by mouth daily. 1 Package 11   No current facility-administered medications for this visit.    Review of Systems Review of Systems Constitutional: negative for fatigue and weight loss Respiratory: negative for cough and wheezing Cardiovascular: negative for chest pain, fatigue and palpitations Gastrointestinal: negative for abdominal pain and change in bowel habits Genitourinary: irregular cycles Integument/breast: negative for nipple discharge Musculoskeletal:negative for  myalgias Neurological: negative for gait problems and tremors Behavioral/Psych: negative for abusive relationship, depression Endocrine: negative for temperature intolerance     Blood pressure 130/87, pulse 106, temperature 98.2 F (36.8 C), height 5' 3.5" (1.613 m), weight 250 lb (113.399 kg), last menstrual period 02/04/2015.  Physical Exam Physical Exam: Deferred  100% of 10 min visit spent on counseling and coordination of care.   Data Reviewed Labs  Assessment     AUB - Hormonal  Counseling and advice for contraception and cycle regulation    Plan    Ultrasound ordered Discussed hormonal regulation of cycles with OCP's  Ortho Novum 1/35 Rx Need to check TSH F/U 2 weeks for results   Orders Placed This Encounter  Procedures  . US Transvaginal Non-OB    Standing Status: Future     Number of Occurrences:      Standing Expiration Date: 05/12/2016    Order Specific Question:  Reason for Exam (SYMPTOM  OR DIAGNOSIS REQUIRED)    Answer:  aub    Order Specific Question:  Preferred imaging location?    Answer:  [facility]  . US Pelvis Complete    Standing Status: Future     Number of Occurrences:      Standing Expiration Date: 05/12/2016    Order Specific Question:  Reason for Exam (SYMPTOM  OR DIAGNOSIS REQUIRED)    Answer:  aub    Order Specific Question:  Preferred imaging location?    Answer:  [facility]   Meds ordered this encounter  Medications  . buPROPion (WELLBUTRIN XL) 150 MG 24 hr tablet    Sig: Take 150 mg by mouth daily.  . norethindrone-ethinyl estradiol 1/35 ([place] 1/35, 28,) tablet    Sig: Take 1 tablet by mouth daily.    Dispense:  1 Package    Refill:  11

## 2015-03-21 ENCOUNTER — Telehealth: Payer: Self-pay | Admitting: *Deleted

## 2015-03-21 ENCOUNTER — Ambulatory Visit (HOSPITAL_COMMUNITY)
Admission: RE | Admit: 2015-03-21 | Discharge: 2015-03-21 | Disposition: A | Discharge status: Home / Self Care | Payer: Medicaid Other | Source: Ambulatory Visit | Attending: Obstetrics | Admitting: Obstetrics

## 2015-03-21 ENCOUNTER — Other Ambulatory Visit: Payer: Self-pay | Admitting: Obstetrics

## 2015-03-21 DIAGNOSIS — N939 Abnormal uterine and vaginal bleeding, unspecified: Secondary | ICD-10-CM

## 2015-03-21 DIAGNOSIS — O209 Hemorrhage in early pregnancy, unspecified: Secondary | ICD-10-CM | POA: Diagnosis present

## 2015-03-21 DIAGNOSIS — Z3200 Encounter for pregnancy test, result unknown: Secondary | ICD-10-CM

## 2015-03-21 DIAGNOSIS — O3680X0 Pregnancy with inconclusive fetal viability, not applicable or unspecified: Secondary | ICD-10-CM

## 2015-03-21 DIAGNOSIS — Z3A01 Less than 8 weeks gestation of pregnancy: Secondary | ICD-10-CM | POA: Diagnosis not present

## 2015-03-21 NOTE — Telephone Encounter (Signed)
Call from [place]- verbal from [place]- patient is possibly early pregnant and needs to be informed with follow up instructions also.

## 2015-03-21 NOTE — Telephone Encounter (Signed)
Pt called to office for u/s results.  Results were reviewed with pt.  Pt made aware that an early pregnancy was detected, however, there was no embryo or FHR noted. Pt was made aware of recommendation for quant draws as well as repeat u/s in 14 days.  Pt was scheduled for lab on 03-22-15 at 1pm, will f/u with Dr Jodi Mourning for recommendation for how often to repeat.  Pt aware that u/s may be ordered and to be scheduled for [facility]. Pt states understanding.

## 2015-03-22 ENCOUNTER — Other Ambulatory Visit: Payer: Medicaid Other

## 2015-03-22 DIAGNOSIS — Z3200 Encounter for pregnancy test, result unknown: Secondary | ICD-10-CM

## 2015-03-22 LAB — HCG, QUANTITATIVE, PREGNANCY: hCG, Beta Chain, Quant, S: 29635.4 m[IU]/mL — ABNORMAL HIGH

## 2015-03-26 ENCOUNTER — Other Ambulatory Visit: Payer: Medicaid Other

## 2015-03-26 DIAGNOSIS — Z3200 Encounter for pregnancy test, result unknown: Secondary | ICD-10-CM

## 2015-03-27 ENCOUNTER — Encounter (HOSPITAL_COMMUNITY): Payer: Self-pay | Admitting: *Deleted

## 2015-03-27 ENCOUNTER — Emergency Department (HOSPITAL_COMMUNITY)
Admission: EM | Admit: 2015-03-27 | Discharge: 2015-03-27 | Disposition: A | Discharge status: Home / Self Care | Payer: Medicaid Other | Attending: Emergency Medicine | Admitting: Emergency Medicine

## 2015-03-27 DIAGNOSIS — S39012A Strain of muscle, fascia and tendon of lower back, initial encounter: Secondary | ICD-10-CM | POA: Insufficient documentation

## 2015-03-27 DIAGNOSIS — Z79899 Other long term (current) drug therapy: Secondary | ICD-10-CM | POA: Diagnosis not present

## 2015-03-27 DIAGNOSIS — Y999 Unspecified external cause status: Secondary | ICD-10-CM | POA: Diagnosis not present

## 2015-03-27 DIAGNOSIS — S4991XA Unspecified injury of right shoulder and upper arm, initial encounter: Secondary | ICD-10-CM | POA: Diagnosis not present

## 2015-03-27 DIAGNOSIS — S161XXA Strain of muscle, fascia and tendon at neck level, initial encounter: Secondary | ICD-10-CM | POA: Diagnosis not present

## 2015-03-27 DIAGNOSIS — O99511 Diseases of the respiratory system complicating pregnancy, first trimester: Secondary | ICD-10-CM | POA: Insufficient documentation

## 2015-03-27 DIAGNOSIS — Y9289 Other specified places as the place of occurrence of the external cause: Secondary | ICD-10-CM | POA: Diagnosis not present

## 2015-03-27 DIAGNOSIS — O9A211 Injury, poisoning and certain other consequences of external causes complicating pregnancy, first trimester: Secondary | ICD-10-CM | POA: Insufficient documentation

## 2015-03-27 DIAGNOSIS — Z7984 Long term (current) use of oral hypoglycemic drugs: Secondary | ICD-10-CM | POA: Diagnosis not present

## 2015-03-27 DIAGNOSIS — S3991XA Unspecified injury of abdomen, initial encounter: Secondary | ICD-10-CM | POA: Diagnosis not present

## 2015-03-27 DIAGNOSIS — J45909 Unspecified asthma, uncomplicated: Secondary | ICD-10-CM | POA: Insufficient documentation

## 2015-03-27 DIAGNOSIS — Z3A01 Less than 8 weeks gestation of pregnancy: Secondary | ICD-10-CM | POA: Insufficient documentation

## 2015-03-27 DIAGNOSIS — Z87891 Personal history of nicotine dependence: Secondary | ICD-10-CM | POA: Diagnosis not present

## 2015-03-27 DIAGNOSIS — Z3490 Encounter for supervision of normal pregnancy, unspecified, unspecified trimester: Secondary | ICD-10-CM

## 2015-03-27 DIAGNOSIS — W010XXA Fall on same level from slipping, tripping and stumbling without subsequent striking against object, initial encounter: Secondary | ICD-10-CM | POA: Insufficient documentation

## 2015-03-27 DIAGNOSIS — W19XXXA Unspecified fall, initial encounter: Secondary | ICD-10-CM

## 2015-03-27 DIAGNOSIS — S3993XA Unspecified injury of pelvis, initial encounter: Secondary | ICD-10-CM | POA: Diagnosis not present

## 2015-03-27 DIAGNOSIS — Z880 Allergy status to penicillin: Secondary | ICD-10-CM | POA: Insufficient documentation

## 2015-03-27 DIAGNOSIS — Y9389 Activity, other specified: Secondary | ICD-10-CM | POA: Insufficient documentation

## 2015-03-27 LAB — HCG, QUANTITATIVE, PREGNANCY: hCG, Beta Chain, Quant, S: 55154.9 m[IU]/mL — ABNORMAL HIGH

## 2015-03-27 NOTE — ED Notes (Signed)
Pt ambulated from waiting room to pod e.

## 2015-03-27 NOTE — ED Notes (Signed)
Pt states she is [redacted] weeks pregnant and fell getting out of her recliner last night. Pt states since the fall she is having cramps on the right side of her neck down her shoulder and lower abdominal cramps around to her back. Pt denies any vaginal bleeding.

## 2015-03-27 NOTE — ED Provider Notes (Signed)
CSN: [id number]     Arrival date & time 03/27/15  0909 History   First MD Initiated Contact with Patient 03/27/15 0913     Chief Complaint  Patient presents with  . Fall  . Abdominal Cramping  . Possible Pregnancy  . Neck Pain      Patient is a 35 y.o. female presenting with fall, cramps, pregnancy problem, and neck pain. The history is provided by the patient.  Fall Associated symptoms include abdominal pain. Pertinent negatives include no chest pain and no shortness of breath.  Abdominal Cramping Associated symptoms include abdominal pain. Pertinent negatives include no chest pain and no shortness of breath.  Possible Pregnancy Associated symptoms include abdominal pain. Pertinent negatives include no chest pain and no shortness of breath.  Neck Pain Associated symptoms: no chest pain and no fever    patient is [redacted] weeks pregnant by ultrasound. She was trying to get out of her recliner and tripped over a blanket and fell, and planing of pain in her neck and lower back. She has some previous neck problems. No loss conscious. No numbness weakness. Also slight right lower pelvic pain. No vaginal bleeding or discharge. She has been seen by Dr. Jodi Mourning for early pregnancy. Reassuring ultrasound but had been trending hCGs and was going to get a new ultrasound in just over a week. She has follow-up with him tomorrow. She is previously been on a muscle relaxer and inflammatory. She does not know she can taken down and did not bring the medicine. No shortness of breath. No numbness weakness. No confusion.and neck pain is dull and worse in her right shoulder area. The low back pain is on the right side and also.  Past Medical History  Diagnosis Date  . Asthma    Past Surgical History  Procedure Laterality Date  . Cesarean section     Family History  Problem Relation Age of Onset  . HIV Father    Social History  Substance Use Topics  . Smoking status: Former Research scientist (life sciences)  . Smokeless tobacco:  Never Used  . Alcohol Use: No     Comment: occ   OB History    Gravida Para Term Preterm AB TAB SAB Ectopic Multiple Living   2 1 1             Review of Systems  Constitutional: Negative for fever and appetite change.  Respiratory: Negative for cough and shortness of breath.   Cardiovascular: Negative for chest pain.  Gastrointestinal: Positive for abdominal pain.  Genitourinary: Negative for flank pain, vaginal bleeding and vaginal pain.  Musculoskeletal: Positive for back pain and neck pain.  Skin: Negative for wound.      Allergies  Percocet; Nerve block tray; Penicillins; and Amoxicillin  Home Medications   Prior to Admission medications   Medication Sig Start Date End Date Taking? Authorizing Provider  albuterol (PROVENTIL HFA;VENTOLIN HFA) 108 (90 BASE) MCG/ACT inhaler Inhale into the lungs every 6 (six) hours as needed for wheezing or shortness of breath.    Historical Provider, MD  buPROPion (WELLBUTRIN XL) 150 MG 24 hr tablet Take 150 mg by mouth daily.    Historical Provider, MD  metFORMIN (GLUCOPHAGE) 500 MG tablet Take 500 mg by mouth See admin instructions. Twice daily every two days.    Historical Provider, MD  norethindrone-ethinyl estradiol 1/35 ([place] 1/35, 28,) tablet Take 1 tablet by mouth daily. 03/15/15   Shelly Bombard, MD   BP 131/57 mmHg  Pulse 83  Temp(Src)  98 F (36.7 C) (Oral)  Resp 16  Ht 5' 3.5" (1.613 m)  Wt 248 lb (112.492 kg)  BMI 43.24 kg/m2  SpO2 99%  LMP 02/04/2015 Physical Exam  Constitutional: She appears well-developed.  Cardiovascular: Normal rate.   Pulmonary/Chest: Effort normal. She exhibits no tenderness.  Abdominal: There is no tenderness.  Musculoskeletal: Normal range of motion.  Mild tenderness to right SI joint area. Mild tenderness over right trapezius. Neurovascular intact over right upper and lower extremities.  Neurological: She is alert.  Skin: Skin is warm.    ED Course  ProceduresLabs Review Labs  Reviewed - No data to display  Imaging Review No results found. I have personally reviewed and evaluated these images and lab results as part of my medical decision-making.   EKG Interpretation None      MDM   Final diagnoses:  Fall, initial encounter  Cervical strain, initial encounter  Lumbar strain, initial encounter  Pregnant   Patient is [redacted] weeks pregnant. Has clinical cervical and lumbar strain. Patient's blood type is B+. Does not need workup for rHog will discharge home with Tylenol. Will call her primary OB for other medications reccomendations   Davonna Belling, MD 03/27/15 [phone]

## 2015-03-27 NOTE — ED Notes (Signed)
Pt is in stable condition upon d/c and ambulates from ED. 

## 2015-03-27 NOTE — Discharge Instructions (Signed)
Take Tylenol as needed for pain. Talk with Dr. Jodi Mourning about your anti-inflammatory or muscle relaxer.  Cervical Sprain A cervical sprain is when the tissues (ligaments) that hold the neck bones in place stretch or tear. HOME CARE   Put ice on the injured area.  Put ice in a plastic bag.  Place a towel between your skin and the bag.  Leave the ice on for 15-20 minutes, 3-4 times a day.  You may have been given a collar to wear. This collar keeps your neck from moving while you heal.  Do not take the collar off unless told by your doctor.  If you have long hair, keep it outside of the collar.  Ask your doctor before changing the position of your collar. You may need to change its position over time to make it more comfortable.  If you are allowed to take off the collar for cleaning or bathing, follow your doctor's instructions on how to do it safely.  Keep your collar clean by wiping it with mild soap and water. Dry it completely. If the collar has removable pads, remove them every 1-2 days to hand wash them with soap and water. Allow them to air dry. They should be dry before you wear them in the collar.  Do not drive while wearing the collar.  Only take medicine as told by your doctor.  Keep all doctor visits as told.  Keep all physical therapy visits as told.  Adjust your work station so that you have good posture while you work.  Avoid positions and activities that make your problems worse.  Warm up and stretch before being active. GET HELP IF:  Your pain is not controlled with medicine.  You cannot take less pain medicine over time as planned.  Your activity level does not improve as expected. GET HELP RIGHT AWAY IF:   You are bleeding.  Your stomach is upset.  You have an allergic reaction to your medicine.  You develop new problems that you cannot explain.  You lose feeling (become numb) or you cannot move any part of your body (paralysis).  You have  tingling or weakness in any part of your body.  Your symptoms get worse. Symptoms include:  Pain, soreness, stiffness, puffiness (swelling), or a burning feeling in your neck.  Pain when your neck is touched.  Shoulder or upper back pain.  Limited ability to move your neck.  Headache.  Dizziness.  Your hands or arms feel week, lose feeling, or tingle.  Muscle spasms.  Difficulty swallowing or chewing. MAKE SURE YOU:   Understand these instructions.  Will watch your condition.  Will get help right away if you are not doing well or get worse.   This information is not intended to replace advice given to you by your health care provider. Make sure you discuss any questions you have with your health care provider.   Document Released: 07/30/2007 Document Revised: 10/13/2012 Document Reviewed: 08/18/2012 Elsevier Interactive Patient Education 2016 [employer]  Lumbosacral Strain Lumbosacral strain is a strain of any of the parts that make up your lumbosacral vertebrae. Your lumbosacral vertebrae are the bones that make up the lower third of your backbone. Your lumbosacral vertebrae are held together by muscles and tough, fibrous tissue (ligaments).  CAUSES  A sudden blow to your back can cause lumbosacral strain. Also, anything that causes an excessive stretch of the muscles in the low back can cause this strain. This is typically seen when  people exert themselves strenuously, fall, lift heavy objects, bend, or crouch repeatedly. RISK FACTORS  Physically demanding work.  Participation in pushing or pulling sports or sports that require a sudden twist of the back (tennis, golf, baseball).  Weight lifting.  Excessive lower back curvature.  Forward-tilted pelvis.  Weak back or abdominal muscles or both.  Tight hamstrings. SIGNS AND SYMPTOMS  Lumbosacral strain may cause pain in the area of your injury or pain that moves (radiates) down your leg.  DIAGNOSIS Your  health care provider can often diagnose lumbosacral strain through a physical exam. In some cases, you may need tests such as X-ray exams.  TREATMENT  Treatment for your lower back injury depends on many factors that your clinician will have to evaluate. However, most treatment will include the use of anti-inflammatory medicines. HOME CARE INSTRUCTIONS   Avoid hard physical activities (tennis, racquetball, waterskiing) if you are not in proper physical condition for it. This may aggravate or create problems.  If you have a back problem, avoid sports requiring sudden body movements. Swimming and walking are generally safer activities.  Maintain good posture.  Maintain a healthy weight.  For acute conditions, you may put ice on the injured area.  Put ice in a plastic bag.  Place a towel between your skin and the bag.  Leave the ice on for 20 minutes, 2-3 times a day.  When the low back starts healing, stretching and strengthening exercises may be recommended. SEEK MEDICAL CARE IF:  Your back pain is getting worse.  You experience severe back pain not relieved with medicines. SEEK IMMEDIATE MEDICAL CARE IF:   You have numbness, tingling, weakness, or problems with the use of your arms or legs.  There is a change in bowel or bladder control.  You have increasing pain in any area of the body, including your belly (abdomen).  You notice shortness of breath, dizziness, or feel faint.  You feel sick to your stomach (nauseous), are throwing up (vomiting), or become sweaty.  You notice discoloration of your toes or legs, or your feet get very cold. MAKE SURE YOU:   Understand these instructions.  Will watch your condition.  Will get help right away if you are not doing well or get worse.   This information is not intended to replace advice given to you by your health care provider. Make sure you discuss any questions you have with your health care provider.   Document  Released: 11/20/2004 Document Revised: 03/03/2014 Document Reviewed: 09/29/2012 Elsevier Interactive Patient Education [employer].

## 2015-03-29 ENCOUNTER — Ambulatory Visit (INDEPENDENT_AMBULATORY_CARE_PROVIDER_SITE_OTHER): Payer: Medicaid Other | Admitting: Obstetrics

## 2015-03-29 ENCOUNTER — Encounter: Payer: Self-pay | Admitting: Obstetrics

## 2015-03-29 VITALS — BP 117/80 | HR 87 | Temp 99.0°F | Wt 251.0 lb

## 2015-03-29 DIAGNOSIS — O3680X1 Pregnancy with inconclusive fetal viability, fetus 1: Secondary | ICD-10-CM

## 2015-03-30 ENCOUNTER — Encounter: Payer: Self-pay | Admitting: Obstetrics

## 2015-03-30 LAB — HCG, QUANTITATIVE, PREGNANCY: hCG, Beta Chain, Quant, S: 83903.9 m[IU]/mL — ABNORMAL HIGH

## 2015-03-30 NOTE — Progress Notes (Signed)
Patient ID: Kristen [name], female   DOB: Jun 15, 1980, 35 y.o.   MRN: [id number]  Chief Complaint  Patient presents with  . Follow-up    HPI Kristen [name] is a 35 y.o. female.  Patient presents for follow up results for quantitative beta HCG and ultrasound.   HPI  Past Medical History  Diagnosis Date  . Asthma     Past Surgical History  Procedure Laterality Date  . Cesarean section      Family History  Problem Relation Age of Onset  . HIV Father     Social History Social History  Substance Use Topics  . Smoking status: Former Research scientist (life sciences)  . Smokeless tobacco: Never Used  . Alcohol Use: No     Comment: occ    Allergies  Allergen Reactions  . Percocet [Oxycodone-Acetaminophen] Anaphylaxis  . Nerve Block Tray Itching and Other (See Comments)    burning  . Penicillins Hives    Has patient had a PCN reaction causing immediate rash, facial/tongue/throat swelling, SOB or lightheadedness with hypotension: No Has patient had a PCN reaction causing severe rash involving mucus membranes or skin necrosis: No Has patient had a PCN reaction that required hospitalization No Has patient had a PCN reaction occurring within the last 10 years: No If all of the above answers are "NO", then may proceed with Cephalosporin use.  Marland Kitchen Amoxicillin     Mood changes    Current Outpatient Prescriptions  Medication Sig Dispense Refill  . albuterol (PROVENTIL HFA;VENTOLIN HFA) 108 (90 BASE) MCG/ACT inhaler Inhale into the lungs every 6 (six) hours as needed for wheezing or shortness of breath.    Marland Kitchen buPROPion (WELLBUTRIN XL) 150 MG 24 hr tablet Take 150 mg by mouth at bedtime. Reported on 03/29/2015    . metFORMIN (GLUCOPHAGE) 500 MG tablet Take 500 mg by mouth 2 (two) times daily with a meal. Reported on 03/29/2015    . norethindrone-ethinyl estradiol 1/35 ([place] 1/35, 28,) tablet Take 1 tablet by mouth daily. (Patient not taking: Reported on 03/27/2015) 1 Package 11   No current  facility-administered medications for this visit.    Review of Systems Review of Systems Constitutional: negative for fatigue and weight loss Respiratory: negative for cough and wheezing Cardiovascular: negative for chest pain, fatigue and palpitations Gastrointestinal: negative for abdominal pain and change in bowel habits Genitourinary:negative Integument/breast: negative for nipple discharge Musculoskeletal:negative for myalgias Neurological: negative for gait problems and tremors Behavioral/Psych: negative for abusive relationship, depression Endocrine: negative for temperature intolerance     Blood pressure 117/80, pulse 87, temperature 99 F (37.2 C), weight 251 lb (113.853 kg), last menstrual period 02/04/2015.  Physical Exam Physical Exam: Deferred  100% of 10 min visit spent on counseling and coordination of care.   Data Reviewed Quantitative beta HCG Ultrasound  Assessment     Early intrauterine pregnancy, ~ 6 weeks.  Inconclusive viability.     Plan    Repeat ultrasound in 2 weeks for determination of viability.  Orders Placed This Encounter  Procedures  . B-HCG Quant   No orders of the defined types were placed in this encounter.

## 2015-04-02 ENCOUNTER — Telehealth: Payer: Self-pay | Admitting: *Deleted

## 2015-04-02 ENCOUNTER — Emergency Department (HOSPITAL_COMMUNITY)
Admission: EM | Admit: 2015-04-02 | Discharge: 2015-04-02 | Disposition: A | Discharge status: Home / Self Care | Payer: Medicaid Other | Attending: Emergency Medicine | Admitting: Emergency Medicine

## 2015-04-02 ENCOUNTER — Encounter (HOSPITAL_COMMUNITY): Payer: Self-pay | Admitting: Cardiology

## 2015-04-02 DIAGNOSIS — O9989 Other specified diseases and conditions complicating pregnancy, childbirth and the puerperium: Secondary | ICD-10-CM | POA: Insufficient documentation

## 2015-04-02 DIAGNOSIS — R5383 Other fatigue: Secondary | ICD-10-CM | POA: Diagnosis not present

## 2015-04-02 DIAGNOSIS — Z79899 Other long term (current) drug therapy: Secondary | ICD-10-CM | POA: Insufficient documentation

## 2015-04-02 DIAGNOSIS — Z3A01 Less than 8 weeks gestation of pregnancy: Secondary | ICD-10-CM | POA: Insufficient documentation

## 2015-04-02 DIAGNOSIS — Z7984 Long term (current) use of oral hypoglycemic drugs: Secondary | ICD-10-CM | POA: Diagnosis not present

## 2015-04-02 DIAGNOSIS — R34 Anuria and oliguria: Secondary | ICD-10-CM | POA: Insufficient documentation

## 2015-04-02 DIAGNOSIS — Z880 Allergy status to penicillin: Secondary | ICD-10-CM | POA: Diagnosis not present

## 2015-04-02 DIAGNOSIS — J029 Acute pharyngitis, unspecified: Secondary | ICD-10-CM | POA: Insufficient documentation

## 2015-04-02 DIAGNOSIS — O210 Mild hyperemesis gravidarum: Secondary | ICD-10-CM | POA: Diagnosis present

## 2015-04-02 DIAGNOSIS — M791 Myalgia: Secondary | ICD-10-CM | POA: Diagnosis not present

## 2015-04-02 DIAGNOSIS — J45909 Unspecified asthma, uncomplicated: Secondary | ICD-10-CM | POA: Diagnosis not present

## 2015-04-02 DIAGNOSIS — Z87891 Personal history of nicotine dependence: Secondary | ICD-10-CM | POA: Diagnosis not present

## 2015-04-02 DIAGNOSIS — O99511 Diseases of the respiratory system complicating pregnancy, first trimester: Secondary | ICD-10-CM | POA: Diagnosis not present

## 2015-04-02 LAB — URINALYSIS, ROUTINE W REFLEX MICROSCOPIC
Bilirubin Urine: NEGATIVE
GLUCOSE, UA: NEGATIVE mg/dL
HGB URINE DIPSTICK: NEGATIVE
Ketones, ur: NEGATIVE mg/dL
Leukocytes, UA: NEGATIVE
Nitrite: NEGATIVE
PROTEIN: NEGATIVE mg/dL
SPECIFIC GRAVITY, URINE: 1.026 (ref 1.005–1.030)
pH: 7.5 (ref 5.0–8.0)

## 2015-04-02 LAB — COMPREHENSIVE METABOLIC PANEL
ALT: 25 U/L (ref 14–54)
ANION GAP: 9 (ref 5–15)
AST: 28 U/L (ref 15–41)
Albumin: 3.3 g/dL — ABNORMAL LOW (ref 3.5–5.0)
Alkaline Phosphatase: 89 U/L (ref 38–126)
BILIRUBIN TOTAL: 0.5 mg/dL (ref 0.3–1.2)
BUN: 10 mg/dL (ref 6–20)
CALCIUM: 9.5 mg/dL (ref 8.9–10.3)
CO2: 27 mmol/L (ref 22–32)
Chloride: 103 mmol/L (ref 101–111)
Creatinine, Ser: 0.81 mg/dL (ref 0.44–1.00)
GFR calc Af Amer: >60 mL/min (ref >60)
Glucose, Bld: 78 mg/dL (ref 65–99)
POTASSIUM: 4 mmol/L (ref 3.5–5.1)
Sodium: 139 mmol/L (ref 135–145)
TOTAL PROTEIN: 7.3 g/dL (ref 6.5–8.1)

## 2015-04-02 LAB — CBC
HCT: 34 % — ABNORMAL LOW (ref 36.0–46.0)
HEMOGLOBIN: 11.6 g/dL — AB (ref 12.0–15.0)
MCH: 23.5 pg — ABNORMAL LOW (ref 26.0–34.0)
MCHC: 34.1 g/dL (ref 30.0–36.0)
MCV: 68.8 fL — ABNORMAL LOW (ref 78.0–100.0)
PLATELETS: 198 10*3/uL (ref 150–400)
RBC: 4.94 MIL/uL (ref 3.87–5.11)
RDW: 15.2 % (ref 11.5–15.5)
WBC: 14.9 10*3/uL — ABNORMAL HIGH (ref 4.0–10.5)

## 2015-04-02 LAB — LIPASE, BLOOD: Lipase: 27 U/L (ref 11–51)

## 2015-04-02 LAB — RAPID STREP SCREEN (MED CTR MEBANE ONLY): STREPTOCOCCUS, GROUP A SCREEN (DIRECT): NEGATIVE

## 2015-04-02 MED ORDER — METOCLOPRAMIDE HCL 5 MG/ML IJ SOLN
10.0000 mg | Freq: Once | INTRAMUSCULAR | Status: AC
  Administered 2015-04-02: 10 mg via INTRAVENOUS
  Filled 2015-04-02: qty 2

## 2015-04-02 MED ORDER — SODIUM CHLORIDE 0.9 % IV BOLUS (SEPSIS)
2000.0000 mL | Freq: Once | INTRAVENOUS | Status: AC
  Administered 2015-04-02: 2000 mL via INTRAVENOUS

## 2015-04-02 MED ORDER — ONDANSETRON HCL 4 MG/2ML IJ SOLN
4.0000 mg | Freq: Once | INTRAMUSCULAR | Status: AC
  Administered 2015-04-02: 4 mg via INTRAVENOUS
  Filled 2015-04-02: qty 2

## 2015-04-02 MED ORDER — ONDANSETRON HCL 4 MG PO TABS: 4.0000 mg | ORAL_TABLET | Freq: Four times a day (QID) | ORAL | Status: DC | PRN

## 2015-04-02 NOTE — ED Notes (Signed)
Pt reports she is between 6-[redacted] weeks pregnant and has been vomiting over the past couple of days. Called her Ob/GYN and told to try some OTC medications but they have not been helping.

## 2015-04-02 NOTE — Discharge Instructions (Signed)
Hyperemesis Gravidarum  Hyperemesis gravidarum is a severe form of nausea and vomiting that happens during pregnancy. Hyperemesis is worse than morning sickness. It may cause you to have nausea or vomiting all day for many days. It may keep you from eating and drinking enough food and liquids. Hyperemesis usually occurs during the first half (the first 20 weeks) of pregnancy. It often goes away once a woman is in her second half of pregnancy. However, sometimes hyperemesis continues through an entire pregnancy.   CAUSES   The cause of this condition is not completely known but is thought to be related to changes in the body's hormones when pregnant. It could be from the high level of the pregnancy hormone or an increase in estrogen in the body.   SIGNS AND SYMPTOMS    Severe nausea and vomiting.   Nausea that does not go away.   Vomiting that does not allow you to keep any food down.   Weight loss and body fluid loss (dehydration).   Having no desire to eat or not liking food you have previously enjoyed.  DIAGNOSIS   Your health care provider will do a physical exam and ask you about your symptoms. He or she may also order blood tests and urine tests to make sure something else is not causing the problem.   TREATMENT   You may only need medicine to control the problem. If medicines do not control the nausea and vomiting, you will be treated in the hospital to prevent dehydration, increased acid in the blood (acidosis), weight loss, and changes in the electrolytes in your body that may harm the unborn baby (fetus). You may need IV fluids.   HOME CARE INSTRUCTIONS    Only take over-the-counter or prescription medicines as directed by your health care provider.   Try eating a couple of dry crackers or toast in the morning before getting out of bed.   Avoid foods and smells that upset your stomach.   Avoid fatty and spicy foods.   Eat 5-6 small meals a day.   Do not drink when eating meals. Drink between  meals.   For snacks, eat high-protein foods, such as cheese.   Eat or suck on things that have ginger in them. Ginger helps nausea.   Avoid food preparation. The smell of food can spoil your appetite.   Avoid iron pills and iron in your multivitamins until after 3-4 months of being pregnant. However, consult with your health care provider before stopping any prescribed iron pills.  SEEK MEDICAL CARE IF:    Your abdominal pain increases.   You have a severe headache.   You have vision problems.   You are losing weight.  SEEK IMMEDIATE MEDICAL CARE IF:    You are unable to keep fluids down.   You vomit blood.   You have constant nausea and vomiting.   You have excessive weakness.   You have extreme thirst.   You have dizziness or fainting.   You have a fever or persistent symptoms for more than 2-3 days.   You have a fever and your symptoms suddenly get worse.  MAKE SURE YOU:    Understand these instructions.   Will watch your condition.   Will get help right away if you are not doing well or get worse.     This information is not intended to replace advice given to you by your health care provider. Make sure you discuss any questions you have with 

## 2015-04-02 NOTE — Telephone Encounter (Signed)
Patient called with extreme nausea- can't keep anything down. 11:47 Call to patient - she is at the ED.

## 2015-04-02 NOTE — ED Provider Notes (Signed)
CSN: [id number]     Arrival date & time 04/02/15  1124 History   First MD Initiated Contact with Patient 04/02/15 1738     Chief Complaint  Patient presents with  . Emesis During Pregnancy     (Consider location/radiation/quality/duration/timing/severity/associated sxs/prior Treatment) HPI Patient with increased nausea and vomiting over the last week and a half. States she is having trouble keeping fluids down. Also complains of generalized fatigue and myalgias. States she is between 6 and [redacted] weeks pregnant by dates. This is her ninth pregnancy. Denies any abdominal pain. No vaginal bleeding or discharge. Patient states she's had decreased urine production but otherwise denies dysuria, hematuria or urgency. Discussed with her OB/GYN who advised her to go to the emergency department for rehydration. Past Medical History  Diagnosis Date  . Asthma    Past Surgical History  Procedure Laterality Date  . Cesarean section     Family History  Problem Relation Age of Onset  . HIV Father    Social History  Substance Use Topics  . Smoking status: Former Research scientist (life sciences)  . Smokeless tobacco: Never Used  . Alcohol Use: No     Comment: occ   OB History    Gravida Para Term Preterm AB TAB SAB Ectopic Multiple Living   2 1 1             Review of Systems  Constitutional: Positive for fatigue. Negative for fever and chills.  HENT: Positive for sore throat. Negative for sinus pressure.   Respiratory: Negative for cough and shortness of breath.   Cardiovascular: Negative for chest pain, palpitations and leg swelling.  Gastrointestinal: Positive for nausea and vomiting. Negative for abdominal pain and diarrhea.  Genitourinary: Negative for dysuria, frequency, hematuria, flank pain, vaginal bleeding, vaginal discharge, difficulty urinating and pelvic pain.  Musculoskeletal: Positive for myalgias. Negative for back pain, neck pain and neck stiffness.  Skin: Negative for rash and wound.  Neurological:  Negative for dizziness, weakness, light-headedness, numbness and headaches.  All other systems reviewed and are negative.     Allergies  Percocet; Nerve block tray; Penicillins; and Amoxicillin  Home Medications   Prior to Admission medications   Medication Sig Start Date End Date Taking? Authorizing Provider  albuterol (PROVENTIL HFA;VENTOLIN HFA) 108 (90 BASE) MCG/ACT inhaler Inhale into the lungs every 6 (six) hours as needed for wheezing or shortness of breath.   Yes Historical Provider, MD  buPROPion (WELLBUTRIN XL) 150 MG 24 hr tablet Take 150 mg by mouth at bedtime. Reported on 03/29/2015   Yes Historical Provider, MD  Cyanocobalamin (VITAMIN B-12 PO) Take 1 tablet by mouth daily.   Yes Historical Provider, MD  metFORMIN (GLUCOPHAGE) 500 MG tablet Take 500 mg by mouth 2 (two) times daily with a meal. Reported on 03/29/2015   Yes Historical Provider, MD  Prenatal Vit-Fe Fumarate-FA (PRENATAL MULTIVITAMIN) TABS tablet Take 1 tablet by mouth daily at 12 noon.   Yes Historical Provider, MD  norethindrone-ethinyl estradiol 1/35 ([place] 1/35, 28,) tablet Take 1 tablet by mouth daily. Patient not taking: Reported on 03/27/2015 03/15/15   Shelly Bombard, MD  ondansetron (ZOFRAN) 4 MG tablet Take 1 tablet (4 mg total) by mouth every 6 (six) hours as needed for nausea or vomiting. 04/02/15   Julianne Rice, MD   BP 103/70 mmHg  Pulse 85  Temp(Src) 97.9 F (36.6 C) (Oral)  Resp 18  Wt 255 lb (115.667 kg)  SpO2 99%  LMP 02/04/2015 Physical Exam  Constitutional: She is oriented  to person, place, and time. She appears well-developed and well-nourished. No distress.  HENT:  Head: Normocephalic and atraumatic.  Mouth/Throat: Oropharynx is clear and moist.  Mildly erythematous oropharynx. No exudates. Uvula is midline  Eyes: EOM are normal. Pupils are equal, round, and reactive to light.  Neck: Normal range of motion. Neck supple.  Cardiovascular: Normal rate and regular rhythm.    Pulmonary/Chest: Effort normal and breath sounds normal. No respiratory distress. She has no wheezes. She has no rales. She exhibits no tenderness.  Abdominal: Soft. Bowel sounds are normal. She exhibits no distension and no mass. There is no tenderness. There is no rebound and no guarding.  Musculoskeletal: Normal range of motion. She exhibits no edema or tenderness.  No CVA tenderness  Neurological: She is alert and oriented to person, place, and time.  Skin: Skin is warm and dry. No rash noted. No erythema.  Psychiatric: She has a normal mood and affect. Her behavior is normal.  Nursing note and vitals reviewed.   ED Course  Procedures (including critical care time) Labs Review Labs Reviewed  COMPREHENSIVE METABOLIC PANEL - Abnormal; Notable for the following:    Albumin 3.3 (*)    All other components within normal limits  CBC - Abnormal; Notable for the following:    WBC 14.9 (*)    Hemoglobin 11.6 (*)    HCT 34.0 (*)    MCV 68.8 (*)    MCH 23.5 (*)    All other components within normal limits  RAPID STREP SCREEN (NOT AT [facility])  CULTURE, GROUP A STREP ([place])  LIPASE, BLOOD  URINALYSIS, ROUTINE W REFLEX MICROSCOPIC (NOT AT [facility])    Imaging Review No results found. I have personally reviewed and evaluated these images and lab results as part of my medical decision-making.   EKG Interpretation None      MDM   Final diagnoses:  Hyperemesis gravidarum   She is feeling much better after second liter of IV fluids. No further nausea. We'll discharge home to follow-up with OB/GYN. Return precautions given.     Julianne Rice, MD 04/02/15 2255

## 2015-04-04 ENCOUNTER — Ambulatory Visit (HOSPITAL_COMMUNITY)
Admission: RE | Admit: 2015-04-04 | Discharge: 2015-04-04 | Disposition: A | Discharge status: Home / Self Care | Payer: Medicaid Other | Source: Ambulatory Visit | Attending: Obstetrics | Admitting: Obstetrics

## 2015-04-04 ENCOUNTER — Encounter: Payer: Self-pay | Admitting: Obstetrics

## 2015-04-04 ENCOUNTER — Ambulatory Visit (INDEPENDENT_AMBULATORY_CARE_PROVIDER_SITE_OTHER): Payer: Medicaid Other | Admitting: Obstetrics

## 2015-04-04 VITALS — BP 122/79 | HR 79 | Temp 98.3°F | Wt 251.0 lb

## 2015-04-04 DIAGNOSIS — Z36 Encounter for antenatal screening of mother: Secondary | ICD-10-CM | POA: Diagnosis not present

## 2015-04-04 DIAGNOSIS — Z3A08 8 weeks gestation of pregnancy: Secondary | ICD-10-CM | POA: Insufficient documentation

## 2015-04-04 DIAGNOSIS — Z3200 Encounter for pregnancy test, result unknown: Secondary | ICD-10-CM

## 2015-04-04 DIAGNOSIS — O3680X0 Pregnancy with inconclusive fetal viability, not applicable or unspecified: Secondary | ICD-10-CM

## 2015-04-04 DIAGNOSIS — O3411 Maternal care for benign tumor of corpus uteri, first trimester: Secondary | ICD-10-CM | POA: Diagnosis not present

## 2015-04-04 DIAGNOSIS — O219 Vomiting of pregnancy, unspecified: Secondary | ICD-10-CM

## 2015-04-04 MED ORDER — PROMETHAZINE HCL 25 MG PO TABS: 25.0000 mg | ORAL_TABLET | Freq: Four times a day (QID) | ORAL | Status: DC | PRN

## 2015-04-04 MED ORDER — DOXYLAMINE-PYRIDOXINE 10-10 MG PO TBEC: DELAYED_RELEASE_TABLET | ORAL | Status: DC

## 2015-04-05 ENCOUNTER — Encounter: Payer: Self-pay | Admitting: Obstetrics

## 2015-04-05 LAB — CULTURE, GROUP A STREP (THRC)

## 2015-04-05 NOTE — Progress Notes (Signed)
S:  Patient presents for results of ultrasound to determine fetal viability.  No complaints. O:  Afebrile, VSS       PE:  Deferred       Ultrasound:  7 week viable IUP.  FHR 159 bpm  A:  7 week viable IUP. P:  F/U in 4 weeks for NOB visit.  Baltazar Najjar MD

## 2015-05-02 ENCOUNTER — Encounter: Payer: Self-pay | Admitting: Obstetrics

## 2015-05-02 ENCOUNTER — Ambulatory Visit (INDEPENDENT_AMBULATORY_CARE_PROVIDER_SITE_OTHER): Payer: Medicaid Other | Admitting: Obstetrics

## 2015-05-02 VITALS — BP 125/84 | HR 87 | Temp 98.0°F | Wt 250.0 lb

## 2015-05-02 DIAGNOSIS — O0991 Supervision of high risk pregnancy, unspecified, first trimester: Secondary | ICD-10-CM

## 2015-05-02 DIAGNOSIS — O09211 Supervision of pregnancy with history of pre-term labor, first trimester: Secondary | ICD-10-CM | POA: Diagnosis not present

## 2015-05-02 LAB — OB RESULTS CONSOLE GC/CHLAMYDIA: GC PROBE AMP, GENITAL: NEGATIVE

## 2015-05-02 NOTE — Progress Notes (Signed)
Subjective:    Kristen [name] is being seen today for her first obstetrical visit.  This is a planned pregnancy. She is at [redacted]w[redacted]d gestation. Her obstetrical history is significant for obesity and previous preterm delivery. Relationship with FOB: spouse, living together. Patient does intend to breast feed. Pregnancy history fully reviewed.  The information documented in the HPI was reviewed and verified.  Menstrual History: OB History    Gravida Para Term Preterm AB TAB SAB Ectopic Multiple Living   12 4 2 2 8  0 0 0 1 4       Patient's last menstrual period was 02/04/2015.    Past Medical History  Diagnosis Date  . Asthma   . Complication of anesthesia     Past Surgical History  Procedure Laterality Date  . Cesarean section       (Not in a hospital admission) Allergies  Allergen Reactions  . Percocet [Oxycodone-Acetaminophen] Anaphylaxis  . Nerve Block Tray Itching and Other (See Comments)    burning  . Penicillins Hives    Has patient had a PCN reaction causing immediate rash, facial/tongue/throat swelling, SOB or lightheadedness with hypotension: No Has patient had a PCN reaction causing severe rash involving mucus membranes or skin necrosis: No Has patient had a PCN reaction that required hospitalization No Has patient had a PCN reaction occurring within the last 10 years: No If all of the above answers are "NO", then may proceed with Cephalosporin use.  Marland Kitchen Amoxicillin     Mood changes    Social History  Substance Use Topics  . Smoking status: Former Research scientist (life sciences)  . Smokeless tobacco: Never Used  . Alcohol Use: No     Comment: occ    Family History  Problem Relation Age of Onset  . HIV Father      Review of Systems Constitutional: negative for weight loss Gastrointestinal: negative for vomiting Genitourinary:negative for genital lesions and vaginal discharge and dysuria Musculoskeletal:negative for back pain Behavioral/Psych: negative for abusive  relationship, depression, illegal drug usage and tobacco use    Objective:    BP 125/84 mmHg  Pulse 87  Temp(Src) 98 F (36.7 C)  Wt 250 lb (113.399 kg)  LMP 02/04/2015 General Appearance:    Alert, cooperative, no distress, appears stated age  Head:    Normocephalic, without obvious abnormality, atraumatic  Eyes:    PERRL, conjunctiva/corneas clear, EOM's intact, fundi    benign, both eyes  Ears:    Normal TM's and external ear canals, both ears  Nose:   Nares normal, septum midline, mucosa normal, no drainage    or sinus tenderness  Throat:   Lips, mucosa, and tongue normal; teeth and gums normal  Neck:   Supple, symmetrical, trachea midline, no adenopathy;    thyroid:  no enlargement/tenderness/nodules; no carotid   bruit or JVD  Back:     Symmetric, no curvature, ROM normal, no CVA tenderness  Lungs:     Clear to auscultation bilaterally, respirations unlabored  Chest Wall:    No tenderness or deformity   Heart:    Regular rate and rhythm, S1 and S2 normal, no murmur, rub   or gallop  Breast Exam:    No tenderness, masses, or nipple abnormality  Abdomen:     Soft, non-tender, bowel sounds active all four quadrants,    no masses, no organomegaly  Genitalia:    Normal female without lesion, discharge or tenderness  Extremities:   Extremities normal, atraumatic, no cyanosis or edema  Pulses:  2+ and symmetric all extremities  Skin:   Skin color, texture, turgor normal, no rashes or lesions  Lymph nodes:   Cervical, supraclavicular, and axillary nodes normal  Neurologic:   CNII-XII intact, normal strength, sensation and reflexes    throughout      Lab Review Urine pregnancy test Labs reviewed yes Radiologic studies reviewed no  Assessment:    Pregnancy at [redacted]w[redacted]d weeks    Plan:      Prenatal vitamins.  Counseling provided regarding continued use of seat belts, cessation of alcohol consumption, smoking or use of illicit drugs; infection precautions i.e., influenza/TDAP  immunizations, toxoplasmosis,CMV, parvovirus, listeria and varicella; workplace safety, exercise during pregnancy; routine dental care, safe medications, sexual activity, hot tubs, saunas, pools, travel, caffeine use, fish and methlymercury, potential toxins, hair treatments, varicose veins Weight gain recommendations per IOM guidelines reviewed: underweight/BMI< 18.5--> gain 28 - 40 lbs; normal weight/BMI 18.5 - 24.9--> gain 25 - 35 lbs; overweight/BMI 25 - 29.9--> gain 15 - 25 lbs; obese/BMI >30->gain  11 - 20 lbs Problem list reviewed and updated. FIRST/CF mutation testing/NIPT/QUAD SCREEN/fragile X/Ashkenazi Jewish population testing/Spinal muscular atrophy discussed: requested. Role of ultrasound in pregnancy discussed; fetal survey: requested. Amniocentesis discussed: not indicated. VBAC calculator score: VBAC consent form provided No orders of the defined types were placed in this encounter.   Orders Placed This Encounter  Procedures  . Culture, OB Urine  . SureSwab, Vaginosis/Vaginitis Plus  . US OB Comp Less 14 Wks    Standing Status: Future     Number of Occurrences:      Standing Expiration Date: 07/01/2016    Order Specific Question:  Reason for Exam (SYMPTOM  OR DIAGNOSIS REQUIRED)    Answer:  History of preterm labor and preterm delivery.    Order Specific Question:  Preferred imaging location?    Answer:  [facility]  . Obstetric panel  . HIV antibody  . Hemoglobinopathy evaluation  . Varicella zoster antibody, IgG  . VITAMIN D 25 Hydroxy (Vit-D Deficiency, Fractures)  . POCT urinalysis dipstick    Follow up in 4 weeks.

## 2015-05-03 LAB — OBSTETRIC PANEL
ANTIBODY SCREEN: NEGATIVE
BASOS ABS: 0 10*3/uL (ref 0.0–0.1)
BASOS PCT: 0 % (ref 0–1)
EOS ABS: 0 10*3/uL (ref 0.0–0.7)
EOS PCT: 0 % (ref 0–5)
HEMATOCRIT: 34.2 % — AB (ref 36.0–46.0)
HEMOGLOBIN: 11.6 g/dL — AB (ref 12.0–15.0)
Hepatitis B Surface Ag: NEGATIVE
LYMPHS PCT: 20 % (ref 12–46)
Lymphs Abs: 3.1 10*3/uL (ref 0.7–4.0)
MCH: 23.8 pg — AB (ref 26.0–34.0)
MCHC: 33.9 g/dL (ref 30.0–36.0)
MCV: 70.2 fL — AB (ref 78.0–100.0)
MONO ABS: 1.2 10*3/uL — AB (ref 0.1–1.0)
Monocytes Relative: 8 % (ref 3–12)
Neutro Abs: 11 10*3/uL — ABNORMAL HIGH (ref 1.7–7.7)
Neutrophils Relative %: 72 % (ref 43–77)
PLATELETS: 240 10*3/uL (ref 150–400)
RBC: 4.87 MIL/uL (ref 3.87–5.11)
RDW: 16.5 % — ABNORMAL HIGH (ref 11.5–15.5)
RH TYPE: POSITIVE
Rubella: 2.29 Index — ABNORMAL HIGH (ref <0.90)
WBC: 15.3 10*3/uL — AB (ref 4.0–10.5)

## 2015-05-03 LAB — VITAMIN D 25 HYDROXY (VIT D DEFICIENCY, FRACTURES): VIT D 25 HYDROXY: 12 ng/mL — AB (ref 30–100)

## 2015-05-03 LAB — HIV ANTIBODY (ROUTINE TESTING W REFLEX): HIV 1&2 Ab, 4th Generation: NONREACTIVE

## 2015-05-03 LAB — VARICELLA ZOSTER ANTIBODY, IGG: VARICELLA IGG: 17.22 {index} (ref <135.00)

## 2015-05-04 LAB — HEMOGLOBINOPATHY EVALUATION
HEMOGLOBIN OTHER: 0 %
HGB A2 QUANT: 2.5 % (ref 2.2–3.2)
HGB F QUANT: 0 % (ref 0.0–2.0)
HGB S QUANTITAION: 0 %
Hgb A: 97.5 % (ref 96.8–97.8)

## 2015-05-05 LAB — SURESWAB, VAGINOSIS/VAGINITIS PLUS
Atopobium vaginae: 6.6 Log (cells/mL)
C. albicans, DNA: NOT DETECTED
C. glabrata, DNA: NOT DETECTED
C. parapsilosis, DNA: NOT DETECTED
C. trachomatis RNA, TMA: NOT DETECTED
C. tropicalis, DNA: NOT DETECTED
LACTOBACILLUS SPECIES: NOT DETECTED Log (cells/mL)
MEGASPHAERA SPECIES: 8 Log (cells/mL)
N. gonorrhoeae RNA, TMA: NOT DETECTED
T. vaginalis RNA, QL TMA: NOT DETECTED

## 2015-05-07 ENCOUNTER — Other Ambulatory Visit: Payer: Self-pay | Admitting: Obstetrics

## 2015-05-07 DIAGNOSIS — N760 Acute vaginitis: Principal | ICD-10-CM

## 2015-05-07 DIAGNOSIS — B9689 Other specified bacterial agents as the cause of diseases classified elsewhere: Secondary | ICD-10-CM

## 2015-05-07 MED ORDER — METRONIDAZOLE 500 MG PO TABS: 500.0000 mg | ORAL_TABLET | Freq: Two times a day (BID) | ORAL | Status: DC

## 2015-05-09 ENCOUNTER — Ambulatory Visit (HOSPITAL_COMMUNITY)
Admission: RE | Admit: 2015-05-09 | Discharge: 2015-05-09 | Disposition: A | Discharge status: Home / Self Care | Payer: Medicaid Other | Source: Ambulatory Visit | Attending: Obstetrics | Admitting: Obstetrics

## 2015-05-09 DIAGNOSIS — O09211 Supervision of pregnancy with history of pre-term labor, first trimester: Secondary | ICD-10-CM

## 2015-05-11 ENCOUNTER — Encounter (HOSPITAL_COMMUNITY): Payer: Self-pay | Admitting: Student

## 2015-05-11 ENCOUNTER — Ambulatory Visit (HOSPITAL_COMMUNITY)
Admit: 2015-05-11 | Discharge: 2015-05-11 | Disposition: A | Discharge status: Home / Self Care | Payer: Medicaid Other | Attending: Gynecology | Admitting: Gynecology

## 2015-05-11 ENCOUNTER — Inpatient Hospital Stay (HOSPITAL_COMMUNITY)
Admission: AD | Admit: 2015-05-11 | Discharge: 2015-05-12 | Disposition: A | Discharge status: Home / Self Care | Payer: Medicaid Other | Source: Ambulatory Visit | Attending: Obstetrics | Admitting: Obstetrics

## 2015-05-11 DIAGNOSIS — Z880 Allergy status to penicillin: Secondary | ICD-10-CM | POA: Insufficient documentation

## 2015-05-11 DIAGNOSIS — O26891 Other specified pregnancy related conditions, first trimester: Secondary | ICD-10-CM | POA: Diagnosis present

## 2015-05-11 DIAGNOSIS — O218 Other vomiting complicating pregnancy: Secondary | ICD-10-CM | POA: Insufficient documentation

## 2015-05-11 DIAGNOSIS — O9A211 Injury, poisoning and certain other consequences of external causes complicating pregnancy, first trimester: Secondary | ICD-10-CM | POA: Diagnosis not present

## 2015-05-11 DIAGNOSIS — Z87891 Personal history of nicotine dependence: Secondary | ICD-10-CM | POA: Insufficient documentation

## 2015-05-11 DIAGNOSIS — O99511 Diseases of the respiratory system complicating pregnancy, first trimester: Secondary | ICD-10-CM | POA: Diagnosis not present

## 2015-05-11 DIAGNOSIS — R109 Unspecified abdominal pain: Secondary | ICD-10-CM | POA: Insufficient documentation

## 2015-05-11 DIAGNOSIS — Z3A12 12 weeks gestation of pregnancy: Secondary | ICD-10-CM | POA: Insufficient documentation

## 2015-05-11 DIAGNOSIS — O219 Vomiting of pregnancy, unspecified: Secondary | ICD-10-CM | POA: Diagnosis not present

## 2015-05-11 DIAGNOSIS — J45909 Unspecified asthma, uncomplicated: Secondary | ICD-10-CM | POA: Diagnosis not present

## 2015-05-11 DIAGNOSIS — O26899 Other specified pregnancy related conditions, unspecified trimester: Secondary | ICD-10-CM

## 2015-05-11 DIAGNOSIS — T373X5A Adverse effect of other antiprotozoal drugs, initial encounter: Secondary | ICD-10-CM | POA: Diagnosis not present

## 2015-05-11 DIAGNOSIS — D72829 Elevated white blood cell count, unspecified: Secondary | ICD-10-CM | POA: Insufficient documentation

## 2015-05-11 DIAGNOSIS — T50905A Adverse effect of unspecified drugs, medicaments and biological substances, initial encounter: Secondary | ICD-10-CM

## 2015-05-11 HISTORY — DX: Endometriosis, unspecified: N80.9

## 2015-05-11 HISTORY — DX: Alpha thalassemia: D56.0

## 2015-05-11 LAB — URINALYSIS, ROUTINE W REFLEX MICROSCOPIC
BILIRUBIN URINE: NEGATIVE
Glucose, UA: NEGATIVE mg/dL
Hgb urine dipstick: NEGATIVE
Ketones, ur: NEGATIVE mg/dL
Leukocytes, UA: NEGATIVE
NITRITE: NEGATIVE
PROTEIN: NEGATIVE mg/dL
SPECIFIC GRAVITY, URINE: 1.02 (ref 1.005–1.030)
pH: 8.5 — ABNORMAL HIGH (ref 5.0–8.0)

## 2015-05-11 LAB — CBC
HCT: 33.3 % — ABNORMAL LOW (ref 36.0–46.0)
Hemoglobin: 11.3 g/dL — ABNORMAL LOW (ref 12.0–15.0)
MCH: 23.3 pg — ABNORMAL LOW (ref 26.0–34.0)
MCHC: 33.9 g/dL (ref 30.0–36.0)
MCV: 68.8 fL — AB (ref 78.0–100.0)
PLATELETS: 211 10*3/uL (ref 150–400)
RBC: 4.84 MIL/uL (ref 3.87–5.11)
RDW: 15.4 % (ref 11.5–15.5)
WBC: 14 10*3/uL — AB (ref 4.0–10.5)

## 2015-05-11 MED ORDER — PROMETHAZINE HCL 25 MG PO TABS
25.0000 mg | ORAL_TABLET | Freq: Once | ORAL | Status: AC
  Administered 2015-05-11: 25 mg via ORAL
  Filled 2015-05-11: qty 1

## 2015-05-11 MED ORDER — MECLIZINE HCL 25 MG PO TABS
25.0000 mg | ORAL_TABLET | Freq: Once | ORAL | Status: AC
  Administered 2015-05-11: 25 mg via ORAL
  Filled 2015-05-11: qty 1

## 2015-05-11 NOTE — MAU Provider Note (Signed)
History     CSN: [id number]  Arrival date and time: 05/11/15 1603   First Provider Initiated Contact with Patient 05/11/15 1804      Chief Complaint  Patient presents with  . Abdominal Cramping   HPI Pt is [redacted]w[redacted]d pregnant [id number]- c/o of sharp right lower quadrant pain. The pain started yesterday when she started flagyl.  Pt is nauseated and vomiting with the pain. The pain does not radiate. Pt denies peri-umbilical pain Pt denies any spotting or bleeding . Pt is concerned because she has had 2 second trimester SABs and the pain, cramping is somewhat similar.   Pt has not taken anything for the pain. The pain does not increase when she walks.  Pt has confirmed IUP on 04/04/2015- pt does have a fibroid- left anterior RN note: RN Occupational hygienist)     Expand All Collapse All   Taking a medication for BV having burning and cramping in lower abdomen, nausea       Past Medical History  Diagnosis Date  . Asthma   . Complication of anesthesia   . Preterm labor with preterm delivery   . Alpha thalassemia ([facility])   . Endometriosis     Past Surgical History  Procedure Laterality Date  . Cesarean section    . Dilation and curettage of uterus      Family History  Problem Relation Age of Onset  . HIV Father     Social History  Substance Use Topics  . Smoking status: Former Research scientist (life sciences)  . Smokeless tobacco: Never Used  . Alcohol Use: No     Comment: occ    Allergies:  Allergies  Allergen Reactions  . Amoxicillin Hives and Shortness Of Breath  . Doxycycline Hives and Shortness Of Breath  . Percocet [Oxycodone-Acetaminophen] Anaphylaxis  . Nerve Block Tray Itching and Other (See Comments)    burning  . Penicillins Hives    Has patient had a PCN reaction causing immediate rash, facial/tongue/throat swelling, SOB or lightheadedness with hypotension: Yes Has patient had a PCN reaction causing severe rash involving mucus membranes or skin necrosis: No Has patient had a PCN  reaction that required hospitalization Yes Has patient had a PCN reaction occurring within the last 10 years: Yes If all of the above answers are "NO", then may proceed with Cephalosporin use.    Prescriptions prior to admission  Medication Sig Dispense Refill Last Dose  . albuterol (PROVENTIL HFA;VENTOLIN HFA) 108 (90 BASE) MCG/ACT inhaler Inhale into the lungs every 6 (six) hours as needed for wheezing or shortness of breath. Reported on 05/02/2015   rescue  . Doxylamine-Pyridoxine (DICLEGIS) 10-10 MG TBEC 1 tab in AM, 1 tab mid afternoon 2 tabs at bedtime. Max dose 4 tabs daily. 100 tablet 5 05/11/2015 at Unknown time  . metFORMIN (GLUCOPHAGE) 500 MG tablet Take 500 mg by mouth 2 (two) times daily with a meal. Reported on 03/29/2015   05/11/2015 at Unknown time  . metroNIDAZOLE (FLAGYL) 500 MG tablet Take 1 tablet (500 mg total) by mouth 2 (two) times daily. 14 tablet 2 05/11/2015 at Unknown time  . promethazine (PHENERGAN) 25 MG tablet Take 1 tablet (25 mg total) by mouth every 6 (six) hours as needed for nausea or vomiting. 30 tablet 2 05/11/2015 at Unknown time  . ondansetron (ZOFRAN) 4 MG tablet Take 1 tablet (4 mg total) by mouth every 6 (six) hours as needed for nausea or vomiting. (Patient not taking: Reported on 05/02/2015) 12 tablet 0 Not Taking  at Unknown time    Review of Systems  Constitutional: Negative for fever and chills.  Eyes: Negative for double vision.  Gastrointestinal: Positive for nausea, vomiting and abdominal pain. Negative for heartburn, diarrhea and constipation.  Genitourinary: Negative for dysuria and urgency.  Musculoskeletal: Positive for back pain.  Neurological: Negative for dizziness and headaches.   Physical Exam   Blood pressure 130/93, pulse 102, temperature 98.1 F (36.7 C), temperature source Oral, resp. rate 18, height 5' 4.37" (1.635 m), weight 248 lb 8 oz (112.719 kg), last menstrual period 02/04/2015, SpO2 100 %.  Physical Exam  Nursing note and vitals  reviewed. Constitutional: She is oriented to person, place, and time. She appears well-developed and well-nourished. No distress.  HENT:  Head: Normocephalic.  Eyes: Pupils are equal, round, and reactive to light.  Neck: Normal range of motion. Neck supple.  Cardiovascular: Normal rate.   Respiratory: Effort normal.  GI: Soft. She exhibits no distension. There is tenderness. There is guarding. There is no rebound.  Genitourinary:  Cervix clean, NT; uterus AGA; right adnexa very tender with exam- no rebound noted; left adnexa without palpable enlargement or tenderness  Musculoskeletal: Normal range of motion.  Neurological: She is alert and oriented to person, place, and time.  Skin: Skin is warm and dry.  Psychiatric: She has a normal mood and affect.    MAU Course  Procedures Results for orders placed or performed during the hospital encounter of 05/11/15 (from the past 24 hour(s))  Urinalysis, Routine w reflex microscopic (not at [facility])     Status: Abnormal   Collection Time: 05/11/15  4:25 PM  Result Value Ref Range   Color, Urine YELLOW YELLOW   APPearance HAZY (A) CLEAR   Specific Gravity, Urine 1.020 1.005 - 1.030   pH 8.5 (H) 5.0 - 8.0   Glucose, UA NEGATIVE NEGATIVE mg/dL   Hgb urine dipstick NEGATIVE NEGATIVE   Bilirubin Urine NEGATIVE NEGATIVE   Ketones, ur NEGATIVE NEGATIVE mg/dL   Protein, ur NEGATIVE NEGATIVE mg/dL   Nitrite NEGATIVE NEGATIVE   Leukocytes, UA NEGATIVE NEGATIVE  CBC     Status: Abnormal   Collection Time: 05/11/15  6:35 PM  Result Value Ref Range   WBC 14.0 (H) 4.0 - 10.5 K/uL   RBC 4.84 3.87 - 5.11 MIL/uL   Hemoglobin 11.3 (L) 12.0 - 15.0 g/dL   HCT 33.3 (L) 36.0 - 46.0 %   MCV 68.8 (L) 78.0 - 100.0 fL   MCH 23.3 (L) 26.0 - 34.0 pg   MCHC 33.9 30.0 - 36.0 g/dL   RDW 15.4 11.5 - 15.5 %   Platelets 211 150 - 400 K/uL   Mr Pelvis Wo Contrast  05/12/2015  CLINICAL DATA:  Sharp right lower quadrant pain, onset yesterday. Pregnant [redacted] weeks 5  days. EXAM: MRI ABDOMEN AND PELVIS WITHOUT AND WITH CONTRAST TECHNIQUE: Multiplanar multisequence MR imaging of the abdomen and pelvis was performed both before and after the administration of intravenous contrast. CONTRAST:  None COMPARISON:  None. FINDINGS: COMBINED FINDINGS FOR BOTH MR ABDOMEN AND PELVIS Lower chest:  No acute findings. Hepatobiliary:  No masses or other significant abnormality. Pancreas: No mass, inflammatory changes, or other significant abnormality. Spleen:  Within normal limits in size and appearance. Adrenals/Urinary Tract: No masses identified. No evidence of hydronephrosis. Stomach/Bowel: Normal appendix. No evidence of bowel obstruction, inflammatory process, or abnormal fluid collections. Vascular/Lymphatic: No pathologically enlarged lymph nodes. Abdominal aorta is normal in caliber. Reproductive: Unremarkable appearances of the gravid uterus. No  adnexal abnormalities. Other:  None. Musculoskeletal:  No suspicious bone lesions identified. IMPRESSION: Normal appendix. Unremarkable gravid uterus. No acute findings are evident. Electronically Signed   By: Andreas Newport M.D.   On: 05/12/2015 00:56   Mr Abdomen Wo Contrast  05/12/2015  CLINICAL DATA:  Sharp right lower quadrant pain, onset yesterday. Pregnant [redacted] weeks 5 days. EXAM: MRI ABDOMEN AND PELVIS WITHOUT AND WITH CONTRAST TECHNIQUE: Multiplanar multisequence MR imaging of the abdomen and pelvis was performed both before and after the administration of intravenous contrast. CONTRAST:  None COMPARISON:  None. FINDINGS: COMBINED FINDINGS FOR BOTH MR ABDOMEN AND PELVIS Lower chest:  No acute findings. Hepatobiliary:  No masses or other significant abnormality. Pancreas: No mass, inflammatory changes, or other significant abnormality. Spleen:  Within normal limits in size and appearance. Adrenals/Urinary Tract: No masses identified. No evidence of hydronephrosis. Stomach/Bowel: Normal appendix. No evidence of bowel obstruction,  inflammatory process, or abnormal fluid collections. Vascular/Lymphatic: No pathologically enlarged lymph nodes. Abdominal aorta is normal in caliber. Reproductive: Unremarkable appearances of the gravid uterus. No adnexal abnormalities. Other:  None. Musculoskeletal:  No suspicious bone lesions identified. IMPRESSION: Normal appendix. Unremarkable gravid uterus. No acute findings are evident. Electronically Signed   By: Andreas Newport M.D.   On: 05/12/2015 00:56   Discussed with Dr. Jodi Mourning- will do MRI to R/O appendix since elevated WBC and localized RLQ pain with N/V Meclezine 25mg  PO given FHT 153 bpm obtained with doppler Care transferred to Hansel Feinstein, CNM. Phenergan given.  Care transferred to [facility], [place]. Pt transferred to [facility] for MRI. NAD. VSS. FHR 153 by doppler.  MRI normal. Pain 8/10 w/ mvmt. Discussed labs, MRI w/ Dr. Jodi Mourning. Ibuprofen given. May D/C home if pain improves.   Pain 3/10. Feels ready for D/C Assessment and Plan   1. Nausea and vomiting of pregnancy, antepartum   2. Abdominal pain during pregnancy   3. Medication reaction, initial encounter    D/C home per consult w/ Dr. Jodi Mourning. Abd pain precautions.  Follow-up Information    Follow up with HARPER,CHARLES A, MD In 2 weeks.   Specialty:  Obstetrics and Gynecology   Why:  Routine prenatal visit os sooner as needed if symptoms worsen   Contact information:   [address] [place] [address] [phone]       Follow up with [facility].   Why:  As needed in emergencies   Contact information:   [address] [id number] [place] [phone]        Medication List    STOP taking these medications        metroNIDAZOLE 500 MG tablet  Commonly known as:  FLAGYL     ondansetron 4 MG tablet  Commonly known as:  ZOFRAN      TAKE these medications        albuterol 108 (90 Base) MCG/ACT  inhaler  Commonly known as:  PROVENTIL HFA;VENTOLIN HFA  Inhale into the lungs every 6 (six) hours as needed for wheezing or shortness of breath. Reported on 05/02/2015     Doxylamine-Pyridoxine 10-10 MG Tbec  Commonly known as:  DICLEGIS  1 tab in AM, 1 tab mid afternoon 2 tabs at bedtime. Max dose 4 tabs daily.     ibuprofen 600 MG tablet  Commonly known as:  ADVIL,MOTRIN  Take 1 tablet (600 mg total) by mouth every 6 (six) hours as needed.     metFORMIN  500 MG tablet  Commonly known as:  GLUCOPHAGE  Take 500 mg by mouth 2 (two) times daily with a meal. Reported on 03/29/2015     metroNIDAZOLE 0.75 % vaginal gel  Commonly known as:  METROGEL  Place 1 Applicatorful vaginally at bedtime. Apply one applicatorful to vagina at bedtime for 5 days     promethazine 25 MG tablet  Commonly known as:  PHENERGAN  Take 1 tablet (25 mg total) by mouth every 6 (six) hours as needed for nausea or vomiting.        LINEBERRY,SUSAN 05/11/2015, 6:06 PM   Manya Silvas, [place] 05/12/2015 2:40 AM

## 2015-05-11 NOTE — MAU Note (Signed)
Taking a medication for BV having burning and cramping in lower abdomen, nausea

## 2015-05-11 NOTE — MAU Note (Signed)
Pt to [facility] for MRI. [facility] transport.

## 2015-05-12 ENCOUNTER — Encounter (HOSPITAL_COMMUNITY): Payer: Self-pay | Admitting: Advanced Practice Midwife

## 2015-05-12 DIAGNOSIS — O219 Vomiting of pregnancy, unspecified: Secondary | ICD-10-CM | POA: Diagnosis not present

## 2015-05-12 MED ORDER — IBUPROFEN 600 MG PO TABS
600.0000 mg | ORAL_TABLET | Freq: Once | ORAL | Status: AC
  Administered 2015-05-12: 600 mg via ORAL
  Filled 2015-05-12: qty 1

## 2015-05-12 MED ORDER — METRONIDAZOLE 0.75 % VA GEL: 1.0000 | Freq: Every day | VAGINAL | Status: DC

## 2015-05-12 MED ORDER — IBUPROFEN 600 MG PO TABS: 600.0000 mg | ORAL_TABLET | Freq: Four times a day (QID) | ORAL | Status: DC | PRN

## 2015-05-12 NOTE — MAU Note (Signed)
Pt back from MRI. C/O increase pain now and nausea still present. Notified provider

## 2015-05-12 NOTE — Discharge Instructions (Signed)

## 2015-05-29 ENCOUNTER — Other Ambulatory Visit: Payer: Self-pay | Admitting: Certified Nurse Midwife

## 2015-05-30 ENCOUNTER — Ambulatory Visit (INDEPENDENT_AMBULATORY_CARE_PROVIDER_SITE_OTHER): Payer: Medicaid Other | Admitting: Obstetrics

## 2015-05-30 VITALS — BP 131/83 | HR 92 | Temp 98.0°F | Wt 248.0 lb

## 2015-05-30 DIAGNOSIS — Z8751 Personal history of pre-term labor: Secondary | ICD-10-CM

## 2015-05-30 DIAGNOSIS — Z3402 Encounter for supervision of normal first pregnancy, second trimester: Secondary | ICD-10-CM

## 2015-05-30 LAB — POCT URINALYSIS DIPSTICK
BILIRUBIN UA: NEGATIVE
GLUCOSE UA: NEGATIVE
Ketones, UA: NEGATIVE
Leukocytes, UA: NEGATIVE
Nitrite, UA: NEGATIVE
RBC UA: NEGATIVE
SPEC GRAV UA: 1.025
UROBILINOGEN UA: NEGATIVE
pH, UA: 6.5

## 2015-05-31 ENCOUNTER — Encounter: Payer: Self-pay | Admitting: Obstetrics

## 2015-05-31 NOTE — Progress Notes (Signed)
  Subjective:    Kristen [name] is a 35 y.o. female being seen today for her obstetrical visit. She is at [redacted]w[redacted]d gestation. Patient reports: no complaints.  Problem List Items Addressed This Visit    None    Visit Diagnoses    Encounter for supervision of normal first pregnancy in second trimester    -  Primary    Relevant Orders    POCT urinalysis dipstick (Completed)    Culture, OB Urine    AFP, Quad Screen    History of preterm delivery        Relevant Orders    [place] MFM OB COMP + 65 WK      There are no active problems to display for this patient.   Objective:     BP 131/83 mmHg  Pulse 92  Temp(Src) 98 F (36.7 C)  Wt 248 lb (112.492 kg)  LMP 02/04/2015 Uterine Size: Below umbilicus     Assessment:    Pregnancy @ [redacted]w[redacted]d  weeks Doing well    Plan:    Problem list reviewed and updated. Labs reviewed.  Follow up in 4 weeks. FIRST/CF mutation testing/NIPT/QUAD SCREEN/fragile X/Ashkenazi Jewish population testing/Spinal muscular atrophy discussed: requested. Role of ultrasound in pregnancy discussed; fetal survey: requested. Amniocentesis discussed: not indicated.

## 2015-06-02 LAB — AFP, QUAD SCREEN
DIA Mom Value: 1.39
DIA Value (EIA): 197.96 pg/mL
DSR (BY AGE) 1 IN: 374
DSR (SECOND TRIMESTER) 1 IN: 1938
Gestational Age: 15.4 WEEKS
MATERNAL AGE AT EDD: 33.9 a
MSAFP MOM: 1.47
MSAFP: 32.3 ng/mL
MSHCG Mom: 1.68
MSHCG: 59325 m[IU]/mL
OSB RISK: 2965
PDF: 0
Test Results:: NEGATIVE
UE3 VALUE: 0.54 ng/mL
Weight: 248 [lb_av]
uE3 Mom: 0.91

## 2015-06-02 LAB — URINE CULTURE, OB REFLEX

## 2015-06-02 LAB — CULTURE, OB URINE

## 2015-06-08 ENCOUNTER — Ambulatory Visit (INDEPENDENT_AMBULATORY_CARE_PROVIDER_SITE_OTHER): Payer: Medicaid Other | Admitting: *Deleted

## 2015-06-08 VITALS — BP 124/87 | HR 79 | Wt 246.0 lb

## 2015-06-08 DIAGNOSIS — O09212 Supervision of pregnancy with history of pre-term labor, second trimester: Secondary | ICD-10-CM

## 2015-06-08 DIAGNOSIS — O09892 Supervision of other high risk pregnancies, second trimester: Secondary | ICD-10-CM

## 2015-06-08 MED ORDER — HYDROXYPROGESTERONE CAPROATE 250 MG/ML IM OIL
250.0000 mg | TOPICAL_OIL | Freq: Once | INTRAMUSCULAR | Status: AC
  Administered 2015-06-08: 250 mg via INTRAMUSCULAR

## 2015-06-08 NOTE — Progress Notes (Signed)
Pt is in office today for 17p injection.  Pt states she is doing well. Pt was given injection, tolerated well.  Pt advised to RTO next week for injection.  BP 124/87 mmHg  Pulse 79  Wt 246 lb (111.585 kg)  LMP 02/04/2015  Administrations This Visit    hydroxyprogesterone caproate (MAKENA) 250 mg/mL injection 250 mg    Admin Date Action Dose Route Administered By         06/08/2015 Given 250 mg Intramuscular Valene Bors, CMA

## 2015-06-15 ENCOUNTER — Inpatient Hospital Stay (HOSPITAL_COMMUNITY)
Admission: AD | Admit: 2015-06-15 | Discharge: 2015-06-15 | Disposition: A | Discharge status: Home / Self Care | Payer: Medicaid Other | Source: Ambulatory Visit | Attending: Obstetrics | Admitting: Obstetrics

## 2015-06-15 ENCOUNTER — Ambulatory Visit (INDEPENDENT_AMBULATORY_CARE_PROVIDER_SITE_OTHER): Payer: Medicaid Other | Admitting: Certified Nurse Midwife

## 2015-06-15 ENCOUNTER — Encounter (HOSPITAL_COMMUNITY): Payer: Self-pay

## 2015-06-15 VITALS — BP 124/89 | HR 102 | Wt 245.0 lb

## 2015-06-15 DIAGNOSIS — Z880 Allergy status to penicillin: Secondary | ICD-10-CM | POA: Insufficient documentation

## 2015-06-15 DIAGNOSIS — E860 Dehydration: Secondary | ICD-10-CM | POA: Diagnosis not present

## 2015-06-15 DIAGNOSIS — Z885 Allergy status to narcotic agent status: Secondary | ICD-10-CM | POA: Diagnosis not present

## 2015-06-15 DIAGNOSIS — O471 False labor at or after 37 completed weeks of gestation: Secondary | ICD-10-CM | POA: Diagnosis not present

## 2015-06-15 DIAGNOSIS — O4702 False labor before 37 completed weeks of gestation, second trimester: Secondary | ICD-10-CM

## 2015-06-15 DIAGNOSIS — O09212 Supervision of pregnancy with history of pre-term labor, second trimester: Secondary | ICD-10-CM | POA: Diagnosis not present

## 2015-06-15 DIAGNOSIS — Z881 Allergy status to other antibiotic agents status: Secondary | ICD-10-CM | POA: Diagnosis not present

## 2015-06-15 DIAGNOSIS — Z3A17 17 weeks gestation of pregnancy: Secondary | ICD-10-CM | POA: Diagnosis not present

## 2015-06-15 DIAGNOSIS — Z3A14 14 weeks gestation of pregnancy: Secondary | ICD-10-CM | POA: Diagnosis not present

## 2015-06-15 DIAGNOSIS — Z87891 Personal history of nicotine dependence: Secondary | ICD-10-CM | POA: Diagnosis not present

## 2015-06-15 DIAGNOSIS — R109 Unspecified abdominal pain: Secondary | ICD-10-CM | POA: Diagnosis present

## 2015-06-15 DIAGNOSIS — J45909 Unspecified asthma, uncomplicated: Secondary | ICD-10-CM | POA: Insufficient documentation

## 2015-06-15 DIAGNOSIS — D560 Alpha thalassemia: Secondary | ICD-10-CM | POA: Diagnosis not present

## 2015-06-15 DIAGNOSIS — Z884 Allergy status to anesthetic agent status: Secondary | ICD-10-CM | POA: Insufficient documentation

## 2015-06-15 DIAGNOSIS — O26892 Other specified pregnancy related conditions, second trimester: Secondary | ICD-10-CM | POA: Insufficient documentation

## 2015-06-15 DIAGNOSIS — Z79899 Other long term (current) drug therapy: Secondary | ICD-10-CM | POA: Diagnosis not present

## 2015-06-15 DIAGNOSIS — Z7984 Long term (current) use of oral hypoglycemic drugs: Secondary | ICD-10-CM | POA: Diagnosis not present

## 2015-06-15 DIAGNOSIS — O0992 Supervision of high risk pregnancy, unspecified, second trimester: Secondary | ICD-10-CM

## 2015-06-15 LAB — POCT URINALYSIS DIPSTICK
Bilirubin, UA: NEGATIVE
Blood, UA: NEGATIVE
GLUCOSE UA: NEGATIVE
Ketones, UA: NEGATIVE
LEUKOCYTES UA: NEGATIVE
Nitrite, UA: NEGATIVE
SPEC GRAV UA: 1.02
UROBILINOGEN UA: NEGATIVE
pH, UA: 5.5

## 2015-06-15 LAB — URINALYSIS, ROUTINE W REFLEX MICROSCOPIC
Bilirubin Urine: NEGATIVE
Glucose, UA: NEGATIVE mg/dL
HGB URINE DIPSTICK: NEGATIVE
Ketones, ur: NEGATIVE mg/dL
LEUKOCYTES UA: NEGATIVE
NITRITE: NEGATIVE
PROTEIN: NEGATIVE mg/dL
SPECIFIC GRAVITY, URINE: 1.025 (ref 1.005–1.030)
pH: 6 (ref 5.0–8.0)

## 2015-06-15 MED ORDER — LACTATED RINGERS IV BOLUS (SEPSIS)
1000.0000 mL | Freq: Once | INTRAVENOUS | Status: AC
  Administered 2015-06-15: 1000 mL via INTRAVENOUS

## 2015-06-15 MED ORDER — HYDROXYZINE HCL 50 MG/ML IM SOLN
50.0000 mg | Freq: Once | INTRAMUSCULAR | Status: AC
  Administered 2015-06-15: 50 mg via INTRAMUSCULAR
  Filled 2015-06-15: qty 1

## 2015-06-15 MED ORDER — HYDROXYPROGESTERONE CAPROATE 250 MG/ML IM OIL
250.0000 mg | TOPICAL_OIL | INTRAMUSCULAR | Status: AC
  Administered 2015-06-15 – 2015-10-08 (×12): 250 mg via INTRAMUSCULAR

## 2015-06-15 NOTE — MAU Provider Note (Signed)
History     CSN: [id number]  Arrival date and time: 06/15/15 1517   First Provider Initiated Contact with Patient 06/15/15 1549      No chief complaint on file.  HPI  Kristen [name] 35 y.o. [id number] [redacted]w[redacted]d presents to the MAU from the office. Dr Jodi Mourning has sen the patient to MAU to receive IVF due to dehydration/abdominal cramping.   Past Medical History  Diagnosis Date  . Asthma   . Complication of anesthesia   . Preterm labor with preterm delivery   . Alpha thalassemia ([place])   . Endometriosis     Past Surgical History  Procedure Laterality Date  . Cesarean section    . Dilation and curettage of uterus      Family History  Problem Relation Age of Onset  . HIV Father     Social History  Substance Use Topics  . Smoking status: Former Research scientist (life sciences)  . Smokeless tobacco: Never Used  . Alcohol Use: No     Comment: occ    Allergies:  Allergies  Allergen Reactions  . Amoxicillin Hives and Shortness Of Breath  . Doxycycline Hives and Shortness Of Breath  . Percocet [Oxycodone-Acetaminophen] Anaphylaxis  . Nerve Block Tray Itching and Other (See Comments)    burning  . Penicillins Hives    Has patient had a PCN reaction causing immediate rash, facial/tongue/throat swelling, SOB or lightheadedness with hypotension: Yes Has patient had a PCN reaction causing severe rash involving mucus membranes or skin necrosis: No Has patient had a PCN reaction that required hospitalization Yes Has patient had a PCN reaction occurring within the last 10 years: Yes If all of the above answers are "NO", then may proceed with Cephalosporin use.    Facility-administered medications prior to admission  Medication Dose Route Frequency Provider Last Rate Last Dose  . hydroxyprogesterone caproate (MAKENA) 250 mg/mL injection 250 mg  250 mg Intramuscular Weekly Rachelle A Denney, CNM   250 mg at 06/15/15 1429   Prescriptions prior to admission  Medication Sig Dispense Refill Last Dose   . albuterol (PROVENTIL HFA;VENTOLIN HFA) 108 (90 BASE) MCG/ACT inhaler Inhale into the lungs every 6 (six) hours as needed for wheezing or shortness of breath. Reported on 05/02/2015   rescue  . Doxylamine-Pyridoxine (DICLEGIS) 10-10 MG TBEC 1 tab in AM, 1 tab mid afternoon 2 tabs at bedtime. Max dose 4 tabs daily. 100 tablet 5 05/11/2015 at Unknown time  . ibuprofen (ADVIL,MOTRIN) 600 MG tablet Take 1 tablet (600 mg total) by mouth every 6 (six) hours as needed. 15 tablet 0   . metFORMIN (GLUCOPHAGE) 500 MG tablet Take 500 mg by mouth 2 (two) times daily with a meal. Reported on 03/29/2015   05/11/2015 at Unknown time  . metroNIDAZOLE (METROGEL) 0.75 % vaginal gel Place 1 Applicatorful vaginally at bedtime. Apply one applicatorful to vagina at bedtime for 5 days 70 g 1   . promethazine (PHENERGAN) 25 MG tablet Take 1 tablet (25 mg total) by mouth every 6 (six) hours as needed for nausea or vomiting. 30 tablet 2 05/11/2015 at Unknown time    Review of Systems  Constitutional: Negative for fever.  Gastrointestinal: Positive for abdominal pain. Negative for nausea and vomiting.  Genitourinary: Negative for dysuria, urgency, frequency and hematuria.  All other systems reviewed and are negative.  Physical Exam   Blood pressure 122/74, pulse 88, temperature 97.9 F (36.6 C), temperature source Oral, resp. rate 18, last menstrual period 02/04/2015.  Physical Exam  Nursing note  and vitals reviewed. Constitutional: She is oriented to person, place, and time. She appears well-developed and well-nourished.  HENT:  Head: Normocephalic and atraumatic.  Cardiovascular: Normal rate.   Respiratory: Effort normal and breath sounds normal. No respiratory distress.  GI: Soft. There is no tenderness.  Musculoskeletal: Normal range of motion.  Neurological: She is alert and oriented to person, place, and time.  Skin: Skin is warm and dry.  Psychiatric: She has a normal mood and affect. Her behavior is normal.  Judgment and thought content normal.   Results for orders placed or performed during the hospital encounter of 06/15/15 (from the past 24 hour(s))  Urinalysis, Routine w reflex microscopic (not at [facility])     Status: Abnormal   Collection Time: 06/15/15  3:30 PM  Result Value Ref Range   Color, Urine YELLOW YELLOW   APPearance HAZY (A) CLEAR   Specific Gravity, Urine 1.025 1.005 - 1.030   pH 6.0 5.0 - 8.0   Glucose, UA NEGATIVE NEGATIVE mg/dL   Hgb urine dipstick NEGATIVE NEGATIVE   Bilirubin Urine NEGATIVE NEGATIVE   Ketones, ur NEGATIVE NEGATIVE mg/dL   Protein, ur NEGATIVE NEGATIVE mg/dL   Nitrite NEGATIVE NEGATIVE   Leukocytes, UA NEGATIVE NEGATIVE   MAU Course  Procedures  MDM IVF and vistaril. Pt mildly sedated; contractions resolved. Pt will be discharged to home. Preterm precautions given  Assessment and Plan  Preterm contractions Preterm precautions Discharge  Clemmons,Lori Grissett 06/15/2015, 3:54 PM

## 2015-06-15 NOTE — Progress Notes (Signed)
Pt is in office for 17p injection.  Pt states that she has been having some cramping and lower back pain.  Pt states them as ctx.  Pt reports +FM, no bleeding or LOF. Pt was advised that she will be put on schedule today to see provider to rule out any complications given her OB history. 17p injection was given, pt tolertated well.  Pt was put on fetal monitor, toco only.

## 2015-06-15 NOTE — MAU Note (Signed)
Urine sent to lab 

## 2015-06-15 NOTE — Progress Notes (Signed)
Patient ID: Kristen [name], female   DOB: February 08, 1981, 35 y.o.   MRN: [id number]   Subjective:    Kristen [name] is a 35 y.o. female being seen today for her obstetrical visit. She is at [redacted]w[redacted]d gestation. Patient reports: contractions since monday, worse at night, no bleeding and no leaking.  States that they are worse at night.  Has been drinking 3 small gatoraide bottles/day.  Strongly encouraged increased water intake.  Is not currently working outside the home.  Bedrest encouraged.  States that her last miscarriage was at 18 weeks.  Hx of 2 PTD @24 -25 weeks.  Patient and spouse are worried about her cramping issues.      Problem List Items Addressed This Visit    None    Visit Diagnoses    Supervision of high risk pregnancy in second trimester    -  Primary    Relevant Medications    hydroxyprogesterone caproate (MAKENA) 250 mg/mL injection 250 mg      There are no active problems to display for this patient.   Objective:     BP 124/89 mmHg  Pulse 102  Wt 245 lb (111.131 kg)  LMP 02/04/2015 Uterine Size: Below umbilicus   FHR; [id number] by doppler  [place]oco: 4 contractions noted  Assessment:    Pregnancy @ [redacted]w[redacted]d  weeks Preterm contractions with hx of multiple SABs     Plan:    Problem list reviewed and updated. Labs reviewed.  Follow up in 4 weeks. FIRST/CF mutation testing/NIPT/QUAD SCREEN/fragile X/Ashkenazi Jewish population testing/Spinal muscular atrophy discussed: results reviewed. Role of ultrasound in pregnancy discussed; fetal survey: ordered. Amniocentesis discussed: not indicated. 50% of 15 minute visit spent on counseling and coordination of care.

## 2015-06-15 NOTE — MAU Note (Addendum)
Has been contracting, ?contractions.  Nausea and vomiting had been doing pretty good.  Last couple days coming back, last night contractions woke her, has since tapered off. Sent from office for fluids.

## 2015-06-15 NOTE — Discharge Instructions (Signed)

## 2015-06-18 ENCOUNTER — Encounter (HOSPITAL_COMMUNITY): Payer: Self-pay | Admitting: Obstetrics

## 2015-06-22 ENCOUNTER — Ambulatory Visit: Payer: Medicaid Other

## 2015-06-22 VITALS — BP 111/69 | HR 85 | Temp 98.1°F | Wt 246.0 lb

## 2015-06-22 DIAGNOSIS — Z3492 Encounter for supervision of normal pregnancy, unspecified, second trimester: Secondary | ICD-10-CM

## 2015-06-22 LAB — OB RESULTS CONSOLE GC/CHLAMYDIA: Chlamydia: NEGATIVE

## 2015-06-25 LAB — NUSWAB VAGINITIS PLUS (VG+)
CANDIDA ALBICANS, NAA: NEGATIVE
Candida glabrata, NAA: NEGATIVE
Chlamydia trachomatis, NAA: NEGATIVE
Neisseria gonorrhoeae, NAA: NEGATIVE
TRICH VAG BY NAA: NEGATIVE

## 2015-06-28 ENCOUNTER — Encounter: Payer: Self-pay | Admitting: Obstetrics

## 2015-06-28 ENCOUNTER — Ambulatory Visit (INDEPENDENT_AMBULATORY_CARE_PROVIDER_SITE_OTHER): Payer: Medicaid Other | Admitting: Obstetrics

## 2015-06-28 ENCOUNTER — Ambulatory Visit (HOSPITAL_COMMUNITY)
Admission: RE | Admit: 2015-06-28 | Discharge: 2015-06-28 | Disposition: A | Discharge status: Home / Self Care | Payer: Medicaid Other | Source: Ambulatory Visit | Attending: Obstetrics | Admitting: Obstetrics

## 2015-06-28 VITALS — BP 138/82 | HR 86 | Wt 246.0 lb

## 2015-06-28 DIAGNOSIS — O09212 Supervision of pregnancy with history of pre-term labor, second trimester: Secondary | ICD-10-CM | POA: Diagnosis not present

## 2015-06-28 DIAGNOSIS — Z3689 Encounter for other specified antenatal screening: Secondary | ICD-10-CM

## 2015-06-28 DIAGNOSIS — Z3492 Encounter for supervision of normal pregnancy, unspecified, second trimester: Secondary | ICD-10-CM

## 2015-06-28 DIAGNOSIS — Z8751 Personal history of pre-term labor: Secondary | ICD-10-CM

## 2015-06-28 DIAGNOSIS — J302 Other seasonal allergic rhinitis: Secondary | ICD-10-CM

## 2015-06-28 DIAGNOSIS — O09892 Supervision of other high risk pregnancies, second trimester: Secondary | ICD-10-CM

## 2015-06-28 DIAGNOSIS — Z3A20 20 weeks gestation of pregnancy: Secondary | ICD-10-CM | POA: Insufficient documentation

## 2015-06-28 DIAGNOSIS — J301 Allergic rhinitis due to pollen: Secondary | ICD-10-CM

## 2015-06-28 DIAGNOSIS — Z36 Encounter for antenatal screening of mother: Secondary | ICD-10-CM | POA: Insufficient documentation

## 2015-06-28 DIAGNOSIS — O99212 Obesity complicating pregnancy, second trimester: Secondary | ICD-10-CM

## 2015-06-28 DIAGNOSIS — O99213 Obesity complicating pregnancy, third trimester: Secondary | ICD-10-CM | POA: Insufficient documentation

## 2015-06-28 DIAGNOSIS — O34212 Maternal care for vertical scar from previous cesarean delivery: Secondary | ICD-10-CM | POA: Diagnosis not present

## 2015-06-28 MED ORDER — LORATADINE 10 MG PO TABS: 10.0000 mg | ORAL_TABLET | Freq: Every day | ORAL | Status: DC

## 2015-06-28 MED ORDER — MOMETASONE FUROATE 50 MCG/ACT NA SUSP: 2.0000 | Freq: Every day | NASAL | Status: DC

## 2015-06-28 NOTE — Progress Notes (Signed)
Subjective:    Kristen [name] is a 35 y.o. female being seen today for her obstetrical visit. She is at [redacted]w[redacted]d gestation. Patient reports: no complaints . Fetal movement: normal.  Problem List Items Addressed This Visit    None    Visit Diagnoses    Seasonal allergies    -  Primary    Relevant Medications    loratadine (CLARITIN) 10 MG tablet    mometasone (NASONEX) 50 MCG/ACT nasal spray    Allergic rhinitis due to pollen        Relevant Medications    loratadine (CLARITIN) 10 MG tablet    mometasone (NASONEX) 50 MCG/ACT nasal spray      There are no active problems to display for this patient.  Objective:    BP 138/82 mmHg  Pulse 86  Wt 246 lb (111.585 kg)  LMP 02/04/2015 FHT: 150 BPM  Uterine Size: size greater than dates     Assessment:    Pregnancy @ [redacted]w[redacted]d    Plan:    Signs and symptoms of preterm labor: discussed.  Labs, problem list reviewed and updated 2 hr GTT planned Follow up in 4 weeks.

## 2015-06-28 NOTE — Progress Notes (Signed)
Patient has no questions or concerns today. Patient tolerated 17 P well on R Ventrogluteal

## 2015-06-29 ENCOUNTER — Other Ambulatory Visit: Payer: Self-pay | Admitting: Obstetrics

## 2015-06-29 DIAGNOSIS — Z3A20 20 weeks gestation of pregnancy: Secondary | ICD-10-CM

## 2015-06-29 DIAGNOSIS — O09892 Supervision of other high risk pregnancies, second trimester: Secondary | ICD-10-CM

## 2015-06-29 DIAGNOSIS — Z3689 Encounter for other specified antenatal screening: Secondary | ICD-10-CM

## 2015-06-29 DIAGNOSIS — O99212 Obesity complicating pregnancy, second trimester: Secondary | ICD-10-CM

## 2015-06-29 DIAGNOSIS — O09212 Supervision of pregnancy with history of pre-term labor, second trimester: Secondary | ICD-10-CM

## 2015-07-05 ENCOUNTER — Other Ambulatory Visit: Payer: Self-pay | Admitting: Certified Nurse Midwife

## 2015-07-05 ENCOUNTER — Ambulatory Visit (INDEPENDENT_AMBULATORY_CARE_PROVIDER_SITE_OTHER): Payer: Medicaid Other | Admitting: *Deleted

## 2015-07-05 VITALS — BP 123/77 | HR 95 | Wt 245.0 lb

## 2015-07-05 DIAGNOSIS — O09212 Supervision of pregnancy with history of pre-term labor, second trimester: Secondary | ICD-10-CM

## 2015-07-05 DIAGNOSIS — O09892 Supervision of other high risk pregnancies, second trimester: Secondary | ICD-10-CM

## 2015-07-05 NOTE — Progress Notes (Signed)
Discuss patient's history of preterm labor- her concerns with her current pregnancy. Patient is aware of her danger signs and when she needs to go to the hospital. She is nervous at this time because it is close to the time she delivered previously.

## 2015-07-12 ENCOUNTER — Ambulatory Visit: Payer: Medicaid Other | Admitting: *Deleted

## 2015-07-12 VITALS — BP 109/72 | HR 88 | Wt 245.0 lb

## 2015-07-12 DIAGNOSIS — Z8751 Personal history of pre-term labor: Secondary | ICD-10-CM

## 2015-07-12 NOTE — Progress Notes (Signed)
Patient tolerated 17P injection well in Left Ventrogluetal

## 2015-07-19 ENCOUNTER — Ambulatory Visit: Payer: Medicaid Other

## 2015-07-19 ENCOUNTER — Other Ambulatory Visit: Payer: Self-pay | Admitting: Certified Nurse Midwife

## 2015-07-19 ENCOUNTER — Ambulatory Visit (HOSPITAL_COMMUNITY)
Admission: RE | Admit: 2015-07-19 | Discharge: 2015-07-19 | Disposition: A | Discharge status: Home / Self Care | Payer: Medicaid Other | Source: Ambulatory Visit | Attending: Certified Nurse Midwife | Admitting: Certified Nurse Midwife

## 2015-07-19 VITALS — BP 111/72 | HR 82 | Wt 245.0 lb

## 2015-07-19 DIAGNOSIS — O09212 Supervision of pregnancy with history of pre-term labor, second trimester: Secondary | ICD-10-CM | POA: Diagnosis not present

## 2015-07-19 DIAGNOSIS — O34219 Maternal care for unspecified type scar from previous cesarean delivery: Secondary | ICD-10-CM | POA: Diagnosis not present

## 2015-07-19 DIAGNOSIS — O0992 Supervision of high risk pregnancy, unspecified, second trimester: Secondary | ICD-10-CM

## 2015-07-19 DIAGNOSIS — Z3A23 23 weeks gestation of pregnancy: Secondary | ICD-10-CM | POA: Insufficient documentation

## 2015-07-19 MED ORDER — HYDROXYPROGESTERONE CAPROATE 250 MG/ML IM OIL
250.0000 mg | TOPICAL_OIL | Freq: Once | INTRAMUSCULAR | Status: AC
  Administered 2015-07-19: 250 mg via INTRAMUSCULAR

## 2015-07-25 ENCOUNTER — Inpatient Hospital Stay (HOSPITAL_COMMUNITY): Admission: AD | Admit: 2015-07-25 | Payer: Medicaid Other | Source: Ambulatory Visit | Admitting: Obstetrics

## 2015-07-26 ENCOUNTER — Ambulatory Visit (INDEPENDENT_AMBULATORY_CARE_PROVIDER_SITE_OTHER): Payer: Medicaid Other | Admitting: Obstetrics

## 2015-07-26 ENCOUNTER — Ambulatory Visit: Payer: Medicaid Other

## 2015-07-26 VITALS — BP 116/56 | HR 94 | Wt 243.0 lb

## 2015-07-26 DIAGNOSIS — O24912 Unspecified diabetes mellitus in pregnancy, second trimester: Secondary | ICD-10-CM

## 2015-07-26 DIAGNOSIS — Z3482 Encounter for supervision of other normal pregnancy, second trimester: Secondary | ICD-10-CM

## 2015-07-26 DIAGNOSIS — O24419 Gestational diabetes mellitus in pregnancy, unspecified control: Secondary | ICD-10-CM

## 2015-07-26 LAB — POCT URINALYSIS DIPSTICK
Bilirubin, UA: NEGATIVE
Glucose, UA: NEGATIVE
Ketones, UA: NEGATIVE
Leukocytes, UA: NEGATIVE
NITRITE UA: NEGATIVE
PH UA: 6
Protein, UA: NEGATIVE
RBC UA: NEGATIVE
Spec Grav, UA: 1.015
UROBILINOGEN UA: NEGATIVE

## 2015-07-26 MED ORDER — METFORMIN HCL ER 500 MG PO TB24
1000.0000 mg | ORAL_TABLET | Freq: Two times a day (BID) | ORAL | Status: DC
Start: 2015-07-26 — End: 2015-11-01

## 2015-07-28 ENCOUNTER — Encounter: Payer: Self-pay | Admitting: Obstetrics

## 2015-07-28 NOTE — Progress Notes (Signed)
Subjective:    Kristen [name] is a 35 y.o. female being seen today for her obstetrical visit. She is at [redacted]w[redacted]d gestation. Patient reports: no complaints . Fetal movement: normal.  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  Objective:    BP 116/56 mmHg  Pulse 94  Wt 243 lb (110.224 kg)  LMP 02/04/2015 FHT: 150 BPM  Uterine Size: size greater than dates     Assessment:    Pregnancy @ [redacted]w[redacted]d    Plan:    Signs and symptoms of preterm labor: discussed.  Labs, problem list reviewed  Follow up in 1 weeks.

## 2015-08-02 ENCOUNTER — Other Ambulatory Visit (INDEPENDENT_AMBULATORY_CARE_PROVIDER_SITE_OTHER): Payer: Medicaid Other | Admitting: *Deleted

## 2015-08-02 DIAGNOSIS — O09212 Supervision of pregnancy with history of pre-term labor, second trimester: Secondary | ICD-10-CM

## 2015-08-02 NOTE — Progress Notes (Unsigned)
Patient tolerated 17P in Right Ventrogluteal well.

## 2015-08-06 ENCOUNTER — Other Ambulatory Visit: Payer: Self-pay | Admitting: Certified Nurse Midwife

## 2015-08-06 ENCOUNTER — Ambulatory Visit (HOSPITAL_COMMUNITY)
Admission: RE | Admit: 2015-08-06 | Discharge: 2015-08-06 | Disposition: A | Discharge status: Home / Self Care | Payer: Medicaid Other | Source: Ambulatory Visit | Attending: Certified Nurse Midwife | Admitting: Certified Nurse Midwife

## 2015-08-06 DIAGNOSIS — Z3A26 26 weeks gestation of pregnancy: Secondary | ICD-10-CM | POA: Insufficient documentation

## 2015-08-06 DIAGNOSIS — O09292 Supervision of pregnancy with other poor reproductive or obstetric history, second trimester: Secondary | ICD-10-CM

## 2015-08-06 DIAGNOSIS — O99212 Obesity complicating pregnancy, second trimester: Secondary | ICD-10-CM | POA: Diagnosis not present

## 2015-08-06 DIAGNOSIS — Z8751 Personal history of pre-term labor: Secondary | ICD-10-CM

## 2015-08-06 DIAGNOSIS — O34219 Maternal care for unspecified type scar from previous cesarean delivery: Secondary | ICD-10-CM | POA: Diagnosis not present

## 2015-08-06 DIAGNOSIS — O09212 Supervision of pregnancy with history of pre-term labor, second trimester: Secondary | ICD-10-CM

## 2015-08-08 ENCOUNTER — Other Ambulatory Visit: Payer: Self-pay | Admitting: Certified Nurse Midwife

## 2015-08-09 ENCOUNTER — Other Ambulatory Visit: Payer: Medicaid Other

## 2015-08-09 ENCOUNTER — Other Ambulatory Visit (INDEPENDENT_AMBULATORY_CARE_PROVIDER_SITE_OTHER): Payer: Medicaid Other

## 2015-08-09 DIAGNOSIS — O09212 Supervision of pregnancy with history of pre-term labor, second trimester: Secondary | ICD-10-CM

## 2015-08-09 NOTE — Progress Notes (Unsigned)
Patient tolerated 17P injection well in Right Ventrogluteal

## 2015-08-16 ENCOUNTER — Ambulatory Visit: Payer: Medicaid Other

## 2015-08-17 ENCOUNTER — Ambulatory Visit (INDEPENDENT_AMBULATORY_CARE_PROVIDER_SITE_OTHER): Payer: Medicaid Other | Admitting: *Deleted

## 2015-08-17 VITALS — BP 124/84 | HR 98 | Wt 240.0 lb

## 2015-08-17 DIAGNOSIS — Z3492 Encounter for supervision of normal pregnancy, unspecified, second trimester: Secondary | ICD-10-CM

## 2015-08-17 DIAGNOSIS — O09212 Supervision of pregnancy with history of pre-term labor, second trimester: Secondary | ICD-10-CM

## 2015-08-17 MED ORDER — HYDROXYPROGESTERONE CAPROATE 250 MG/ML IM OIL
250.0000 mg | TOPICAL_OIL | Freq: Once | INTRAMUSCULAR | Status: AC
  Administered 2015-08-17: 250 mg via INTRAMUSCULAR

## 2015-08-23 ENCOUNTER — Ambulatory Visit (INDEPENDENT_AMBULATORY_CARE_PROVIDER_SITE_OTHER): Payer: Medicaid Other | Admitting: Obstetrics

## 2015-08-23 ENCOUNTER — Encounter: Payer: Self-pay | Admitting: Obstetrics

## 2015-08-23 VITALS — BP 103/64 | HR 102 | Wt 241.0 lb

## 2015-08-23 DIAGNOSIS — Z3493 Encounter for supervision of normal pregnancy, unspecified, third trimester: Secondary | ICD-10-CM

## 2015-08-23 DIAGNOSIS — O24913 Unspecified diabetes mellitus in pregnancy, third trimester: Secondary | ICD-10-CM

## 2015-08-23 NOTE — Progress Notes (Signed)
Patient received 17P with 4wk ROB. Tolerated injection well in R ventogluteal.

## 2015-08-23 NOTE — Progress Notes (Signed)
Subjective:    Kristen [name] is a 35 y.o. female being seen today for her obstetrical visit. She is at [redacted]w[redacted]d gestation. Patient reports: no complaints . Fetal movement: normal.  Problem List Items Addressed This Visit    None     There are no active problems to display for this patient.  Objective:    BP 103/64 mmHg  Pulse 102  Wt 241 lb (109.317 kg)  LMP 02/04/2015 FHT: 150 BPM  Uterine Size: size equals dates     Assessment:    Pregnancy @ [redacted]w[redacted]d    Plan:      Labs, problem list reviewed and updated Follow up in 2 weeks.

## 2015-08-24 ENCOUNTER — Ambulatory Visit: Payer: Medicaid Other

## 2015-08-30 ENCOUNTER — Ambulatory Visit: Payer: Medicaid Other

## 2015-09-04 ENCOUNTER — Ambulatory Visit (INDEPENDENT_AMBULATORY_CARE_PROVIDER_SITE_OTHER): Payer: Medicaid Other | Admitting: Obstetrics

## 2015-09-04 VITALS — BP 122/82 | HR 97 | Temp 98.3°F | Wt 241.8 lb

## 2015-09-04 DIAGNOSIS — B3731 Acute candidiasis of vulva and vagina: Secondary | ICD-10-CM

## 2015-09-04 DIAGNOSIS — B373 Candidiasis of vulva and vagina: Secondary | ICD-10-CM

## 2015-09-04 DIAGNOSIS — O3663X1 Maternal care for excessive fetal growth, third trimester, fetus 1: Secondary | ICD-10-CM

## 2015-09-04 DIAGNOSIS — Z3493 Encounter for supervision of normal pregnancy, unspecified, third trimester: Secondary | ICD-10-CM

## 2015-09-04 DIAGNOSIS — K219 Gastro-esophageal reflux disease without esophagitis: Secondary | ICD-10-CM

## 2015-09-04 LAB — POCT URINALYSIS DIPSTICK
BILIRUBIN UA: NEGATIVE
GLUCOSE UA: NEGATIVE
KETONES UA: NEGATIVE
Leukocytes, UA: NEGATIVE
Nitrite, UA: NEGATIVE
Protein, UA: NEGATIVE
RBC UA: NEGATIVE
SPEC GRAV UA: 1.01
Urobilinogen, UA: NEGATIVE
pH, UA: 7

## 2015-09-04 MED ORDER — OMEPRAZOLE 20 MG PO CPDR: 20.0000 mg | DELAYED_RELEASE_CAPSULE | Freq: Two times a day (BID) | ORAL | Status: DC

## 2015-09-04 MED ORDER — CITRANATAL HARMONY 27-1-260 MG PO CAPS: 1.0000 | ORAL_CAPSULE | Freq: Every day | ORAL | Status: DC

## 2015-09-04 MED ORDER — TERCONAZOLE 0.8 % VA CREA: 1.0000 | TOPICAL_CREAM | Freq: Every day | VAGINAL | Status: DC

## 2015-09-04 NOTE — Progress Notes (Signed)
Vulva and labia tender and slight itching.

## 2015-09-05 ENCOUNTER — Encounter: Payer: Self-pay | Admitting: Obstetrics

## 2015-09-05 NOTE — Progress Notes (Signed)
Subjective:    Kristen [name] is a 35 y.o. female being seen today for her obstetrical visit. She is at [redacted]w[redacted]d gestation. Patient reports heartburn. Fetal movement: normal.  Problem List Items Addressed This Visit    None    Visit Diagnoses    Prenatal care, third trimester    -  Primary    Relevant Medications    Prenat-FeFmCb-DSS-FA-DHA w/o A (CITRANATAL HARMONY) 27-1-260 MG CAPS    Other Relevant Orders    POCT urinalysis dipstick (Completed)    GERD without esophagitis        Relevant Medications    omeprazole (PRILOSEC) 20 MG capsule    Candida vaginitis        Relevant Medications    terconazole (TERAZOL 3) 0.8 % vaginal cream    LGA (large for gestational age) fetus affecting management of mother, third trimester, fetus 1        Relevant Orders    [place] MFM OB COMP + 79 WK    [place] MFM OB Transvaginal    [place] MFM OB FOLLOW UP      There are no active problems to display for this patient.  Objective:    BP 122/82 mmHg  Pulse 97  Temp(Src) 98.3 F (36.8 C)  Wt 241 lb 12.8 oz (109.68 kg)  LMP 02/04/2015 FHT:  150 BPM  Uterine Size: Size>Dates  Presentation: unsure     Assessment:    Pregnancy @ [redacted]w[redacted]d weeks   Plan:     labs reviewed, problem list updated Consent signed. GBS sent TDAP offered  Rhogam given for RH negative Pediatrician: discussed. Infant feeding: plans to breastfeed. Maternity leave: discussed. Cigarette smokinformer smokerformer smoker. Orders Placed This Encounter  Procedures  . [place] MFM OB COMP + 14 WK    Standing Status: Future     Number of Occurrences:      Standing Expiration Date: 11/04/2016    Order Specific Question:  Reason for Exam (SYMPTOM  OR DIAGNOSIS REQUIRED)    Answer:  lga    Order Specific Question:  Preferred imaging location?    Answer:  [facility]  . [place] MFM OB Transvaginal    Standing Status: Future     Number of Occurrences:      Standing Expiration Date: 11/04/2016    Order Specific Question:  Reason for  Exam (SYMPTOM  OR DIAGNOSIS REQUIRED)    Answer:  lga    Order Specific Question:  Preferred imaging location?    Answer:  [facility]  . [place] MFM OB FOLLOW UP    Standing Status: Future     Number of Occurrences:      Standing Expiration Date: 11/04/2016    Order Specific Question:  Reason for Exam (SYMPTOM  OR DIAGNOSIS REQUIRED)    Answer:  lga    Order Specific Question:  Preferred imaging location?    Answer:  [facility]  . POCT urinalysis dipstick   Meds ordered this encounter  Medications  . DISCONTD: Prenat-FeFmCb-DSS-FA-DHA w/o A (CITRANATAL HARMONY) 27-1-260 MG CAPS    Sig: Take 1 capsule by mouth daily.  . Prenat-FeFmCb-DSS-FA-DHA w/o A (CITRANATAL HARMONY) 27-1-260 MG CAPS    Sig: Take 1 capsule by mouth daily.    Dispense:  30 capsule    Refill:  11  . terconazole (TERAZOL 3) 0.8 % vaginal cream    Sig: Place 1 applicator vaginally at bedtime.    Dispense:  20 g    Refill:  0  . omeprazole (PRILOSEC) 20 MG  capsule    Sig: Take 1 capsule (20 mg total) by mouth 2 (two) times daily before a meal.    Dispense:  60 capsule    Refill:  5   Follow up in 2 Weeks.

## 2015-09-07 ENCOUNTER — Ambulatory Visit: Payer: Medicaid Other

## 2015-09-12 ENCOUNTER — Ambulatory Visit (HOSPITAL_COMMUNITY)
Admission: RE | Admit: 2015-09-12 | Discharge: 2015-09-12 | Disposition: A | Discharge status: Home / Self Care | Payer: Medicaid Other | Source: Ambulatory Visit | Attending: Obstetrics | Admitting: Obstetrics

## 2015-09-12 ENCOUNTER — Ambulatory Visit (HOSPITAL_COMMUNITY): Payer: Medicaid Other

## 2015-09-12 ENCOUNTER — Other Ambulatory Visit (HOSPITAL_COMMUNITY): Payer: Self-pay | Admitting: *Deleted

## 2015-09-12 ENCOUNTER — Other Ambulatory Visit: Payer: Self-pay | Admitting: Obstetrics

## 2015-09-12 DIAGNOSIS — O3660X0 Maternal care for excessive fetal growth, unspecified trimester, not applicable or unspecified: Secondary | ICD-10-CM | POA: Diagnosis not present

## 2015-09-12 DIAGNOSIS — O09893 Supervision of other high risk pregnancies, third trimester: Secondary | ICD-10-CM

## 2015-09-12 DIAGNOSIS — Z3A31 31 weeks gestation of pregnancy: Secondary | ICD-10-CM

## 2015-09-12 DIAGNOSIS — O99213 Obesity complicating pregnancy, third trimester: Secondary | ICD-10-CM | POA: Diagnosis not present

## 2015-09-12 DIAGNOSIS — O34219 Maternal care for unspecified type scar from previous cesarean delivery: Secondary | ICD-10-CM

## 2015-09-12 DIAGNOSIS — O09213 Supervision of pregnancy with history of pre-term labor, third trimester: Secondary | ICD-10-CM | POA: Insufficient documentation

## 2015-09-12 DIAGNOSIS — O24419 Gestational diabetes mellitus in pregnancy, unspecified control: Secondary | ICD-10-CM

## 2015-09-12 DIAGNOSIS — O3663X1 Maternal care for excessive fetal growth, third trimester, fetus 1: Secondary | ICD-10-CM

## 2015-09-12 DIAGNOSIS — IMO0002 Reserved for concepts with insufficient information to code with codable children: Secondary | ICD-10-CM

## 2015-09-14 ENCOUNTER — Ambulatory Visit (INDEPENDENT_AMBULATORY_CARE_PROVIDER_SITE_OTHER): Payer: Medicaid Other | Admitting: Certified Nurse Midwife

## 2015-09-14 VITALS — BP 119/80 | HR 90 | Temp 98.7°F | Wt 241.5 lb

## 2015-09-14 DIAGNOSIS — O09213 Supervision of pregnancy with history of pre-term labor, third trimester: Secondary | ICD-10-CM | POA: Diagnosis not present

## 2015-09-14 DIAGNOSIS — O0993 Supervision of high risk pregnancy, unspecified, third trimester: Secondary | ICD-10-CM | POA: Insufficient documentation

## 2015-09-14 NOTE — Progress Notes (Signed)
Subjective:    Kristen [name] is a 35 y.o. female being seen today for her obstetrical visit. She is at [redacted]w[redacted]d gestation. Patient reports backache, no bleeding, no leaking, occasional contractions and reports around 6 Cxns overnight about 30 minutes apart; drank fluids and took tylenol and rested and the contractions stopped.  would like to try Boost RX, hx of IUGR infant with feeding issues currently.  Has been stressed since dx of IUGR. Fetal movement: normal.  Problem List Items Addressed This Visit    None     There are no active problems to display for this patient.  Objective:    BP 119/80 mmHg  Pulse 90  Temp(Src) 98.7 F (37.1 C)  Wt 241 lb 8 oz (109.544 kg)  LMP 02/04/2015 FHT:  150 BPM  Uterine Size: 38 cm and size greater than dates  Presentation: unsure     Assessment:    Pregnancy @ [redacted]w[redacted]d weeks   Hx of IUGR infant  Current IUGR infant  S>D  Morbid maternal obesity  Hx of PTD X2: on 17-P injections  Plan:    Boost RX for [facility] given to patient   labs reviewed, problem list updated Consent signed. GBS planning TDAP offered  Rhogam given for RH negative Pediatrician: discussed. Infant feeding: plans to breastfeed. Maternity leave: discussed. Cigarette smoking: never smoked. No orders of the defined types were placed in this encounter.   No orders of the defined types were placed in this encounter.   Follow up in 2 Weeks with NST.

## 2015-09-14 NOTE — Assessment & Plan Note (Signed)
  Clinic  [facility] Prenatal Labs  Dating  Blood type: B/POS/-- (03/08 1450)   Genetic Screen 1 Screen:    AFP:     Quad:     NIPS: Antibody:NEG (03/08 1450)  Anatomic [place]  Rubella: 2.29 (03/08 1450)  GTT Early:               Third trimester:  RPR: NON REAC (03/08 1450)   Flu vaccine  HBsAg: NEGATIVE (03/08 1450)   TDaP vaccine  HIV: NONREACTIVE (03/08 1450)   Baby Food  Breast GBS: (For PCN allergy, check sensitivities)  Contraception  Pap:  Circumcision  [facility]   Pediatrician Dr. Bethena Midget @ [facility] for [place]

## 2015-09-15 ENCOUNTER — Inpatient Hospital Stay (EMERGENCY_DEPARTMENT_HOSPITAL)
Admission: AD | Admit: 2015-09-15 | Discharge: 2015-09-16 | Disposition: A | Discharge status: Home / Self Care | Payer: Medicaid Other | Source: Ambulatory Visit | Attending: Family Medicine | Admitting: Family Medicine

## 2015-09-15 DIAGNOSIS — O368130 Decreased fetal movements, third trimester, not applicable or unspecified: Secondary | ICD-10-CM | POA: Insufficient documentation

## 2015-09-15 DIAGNOSIS — Z7984 Long term (current) use of oral hypoglycemic drugs: Secondary | ICD-10-CM

## 2015-09-15 DIAGNOSIS — Z3A31 31 weeks gestation of pregnancy: Secondary | ICD-10-CM | POA: Insufficient documentation

## 2015-09-15 DIAGNOSIS — Z87891 Personal history of nicotine dependence: Secondary | ICD-10-CM | POA: Insufficient documentation

## 2015-09-15 DIAGNOSIS — Z79899 Other long term (current) drug therapy: Secondary | ICD-10-CM | POA: Insufficient documentation

## 2015-09-15 DIAGNOSIS — D560 Alpha thalassemia: Secondary | ICD-10-CM | POA: Insufficient documentation

## 2015-09-15 DIAGNOSIS — J45909 Unspecified asthma, uncomplicated: Secondary | ICD-10-CM | POA: Insufficient documentation

## 2015-09-15 DIAGNOSIS — O99513 Diseases of the respiratory system complicating pregnancy, third trimester: Secondary | ICD-10-CM | POA: Insufficient documentation

## 2015-09-15 DIAGNOSIS — Z880 Allergy status to penicillin: Secondary | ICD-10-CM

## 2015-09-15 DIAGNOSIS — O24419 Gestational diabetes mellitus in pregnancy, unspecified control: Secondary | ICD-10-CM | POA: Insufficient documentation

## 2015-09-15 DIAGNOSIS — Z3A00 Weeks of gestation of pregnancy not specified: Secondary | ICD-10-CM | POA: Insufficient documentation

## 2015-09-16 ENCOUNTER — Encounter (HOSPITAL_COMMUNITY): Payer: Self-pay | Admitting: *Deleted

## 2015-09-16 ENCOUNTER — Inpatient Hospital Stay (HOSPITAL_COMMUNITY)
Admission: AD | Admit: 2015-09-16 | Discharge: 2015-09-16 | Disposition: A | Discharge status: Home / Self Care | Payer: Medicaid Other | Source: Ambulatory Visit | Attending: Obstetrics | Admitting: Obstetrics

## 2015-09-16 ENCOUNTER — Inpatient Hospital Stay (HOSPITAL_COMMUNITY): Payer: Medicaid Other

## 2015-09-16 DIAGNOSIS — D560 Alpha thalassemia: Secondary | ICD-10-CM | POA: Diagnosis not present

## 2015-09-16 DIAGNOSIS — Z3A00 Weeks of gestation of pregnancy not specified: Secondary | ICD-10-CM | POA: Diagnosis not present

## 2015-09-16 DIAGNOSIS — O368130 Decreased fetal movements, third trimester, not applicable or unspecified: Secondary | ICD-10-CM

## 2015-09-16 DIAGNOSIS — O24419 Gestational diabetes mellitus in pregnancy, unspecified control: Secondary | ICD-10-CM | POA: Diagnosis not present

## 2015-09-16 DIAGNOSIS — O368190 Decreased fetal movements, unspecified trimester, not applicable or unspecified: Secondary | ICD-10-CM

## 2015-09-16 DIAGNOSIS — O368135 Decreased fetal movements, third trimester, fetus 5: Secondary | ICD-10-CM

## 2015-09-16 DIAGNOSIS — J45909 Unspecified asthma, uncomplicated: Secondary | ICD-10-CM | POA: Diagnosis not present

## 2015-09-16 DIAGNOSIS — O288 Other abnormal findings on antenatal screening of mother: Secondary | ICD-10-CM

## 2015-09-16 DIAGNOSIS — Z87891 Personal history of nicotine dependence: Secondary | ICD-10-CM | POA: Diagnosis not present

## 2015-09-16 NOTE — MAU Provider Note (Signed)
MAU HISTORY AND PHYSICAL  Chief Complaint:  Decreased FM  Kristen [name] is a 35 y.o.  [id number]  at [redacted]w[redacted]d presenting for decreased FM. Patient states she has been having  irregular, every >10 minutes contractions, none vaginal bleeding, intact membranes, with decreased  fetal movement.  Baby did not move from 8pm-midnight, despite juice and yoga. Mom felt baby move normally in the early morning on 7/22.   Past Medical History:  Diagnosis Date  . Alpha thalassemia ([place])   . Asthma   . Complication of anesthesia   . Endometriosis   . Preterm labor with preterm delivery     Past Surgical History:  Procedure Laterality Date  . CESAREAN SECTION    . DILATION AND CURETTAGE OF UTERUS      Family History  Problem Relation Age of Onset  . HIV Father     Social History  Substance Use Topics  . Smoking status: Former Research scientist (life sciences)  . Smokeless tobacco: Never Used  . Alcohol use No     Comment: occ    Allergies  Allergen Reactions  . Amoxicillin Hives and Shortness Of Breath  . Doxycycline Hives and Shortness Of Breath  . Percocet [Oxycodone-Acetaminophen] Anaphylaxis  . Nerve Block Tray Itching and Other (See Comments)    burning  . Penicillins Hives    Has patient had a PCN reaction causing immediate rash, facial/tongue/throat swelling, SOB or lightheadedness with hypotension: Yes Has patient had a PCN reaction causing severe rash involving mucus membranes or skin necrosis: No Has patient had a PCN reaction that required hospitalization Yes Has patient had a PCN reaction occurring within the last 10 years: Yes If all of the above answers are "NO", then may proceed with Cephalosporin use.    Facility-Administered Medications Prior to Admission  Medication Dose Route Frequency Provider Last Rate Last Dose  . hydroxyprogesterone caproate (MAKENA) 250 mg/mL injection 250 mg  250 mg Intramuscular Weekly Rachelle A Denney, CNM   250 mg at 09/14/15 1101   Prescriptions Prior to  Admission  Medication Sig Dispense Refill Last Dose  . albuterol (PROVENTIL HFA;VENTOLIN HFA) 108 (90 BASE) MCG/ACT inhaler Inhale 2 puffs into the lungs every 6 (six) hours as needed for wheezing or shortness of breath. Reported on 05/02/2015   Taking  . loratadine (CLARITIN) 10 MG tablet Take 1 tablet (10 mg total) by mouth daily. 30 tablet 11 Taking  . metFORMIN (GLUCOPHAGE XR) 500 MG 24 hr tablet Take 2 tablets (1,000 mg total) by mouth 2 (two) times daily at 8 am and 10 pm. 120 tablet 11 Taking  . mometasone (NASONEX) 50 MCG/ACT nasal spray Place 2 sprays into the nose daily. 17 g 12 Taking  . omeprazole (PRILOSEC) 20 MG capsule Take 1 capsule (20 mg total) by mouth 2 (two) times daily before a meal. 60 capsule 5   . Prenat-FeFmCb-DSS-FA-DHA w/o A (CITRANATAL HARMONY) 27-1-260 MG CAPS Take 1 capsule by mouth daily. 30 capsule 11   . promethazine (PHENERGAN) 25 MG tablet Take 1 tablet (25 mg total) by mouth every 6 (six) hours as needed for nausea or vomiting. (Patient not taking: Reported on 09/04/2015) 30 tablet 2 Not Taking  . terconazole (TERAZOL 3) 0.8 % vaginal cream Place 1 applicator vaginally at bedtime. 20 g 0     Review of Systems - Negative except for what is mentioned in HPI.  Physical Exam  Last menstrual period 02/04/2015. GENERAL: Well-developed, well-nourished female in no acute distress.  LUNGS: Clear to auscultation bilaterally.  HEART: Regular rate and rhythm. ABDOMEN: Soft, nontender, nondistended, gravid.  EXTREMITIES: Nontender, no edema, 2+ distal pulses. FHT:  NST reactive Contractions: irregular   Labs: No results found for this or any previous visit (from the past 24 hour(s)).  Imaging Studies:  [place] Mfm Fetal Bpp Wo Non Stress  Result Date: 09/12/2015 OBSTETRICAL ULTRASOUND: This exam was performed within a [facility] Department. The OB US report was generated in the AS system, and faxed to the ordering physician.  This report is available in the  [facility]. See the AS Obstetric US report via the Image Link.  [place] Mfm Ob Follow Up  Result Date: 09/12/2015 OBSTETRICAL ULTRASOUND: This exam was performed within a [facility] Ultrasound Department. The OB US report was generated in the AS system, and faxed to the ordering physician.  This report is available in the [facility]. See the AS Obstetric US report via the Image Link.  [place] Mfm Ua Cord Doppler  Result Date: 09/12/2015 OBSTETRICAL ULTRASOUND: This exam was performed within a [facility] [place] [facility] Ultrasound Department. The OB US report was generated in the AS system, and faxed to the ordering physician.  This report is available in the [facility]. See the AS Obstetric US report via the Image Link.   Assessment: Kristen [name] is  35 y.o. [id number] at [redacted]w[redacted]d presents with decreased fetal movement.  Plan: Decreased fetal movement: baby moved subjectively as monitors were applied. NST reactive. Dispo home with kick counts and return precautions.  Ralene Ok 7/23/20176:15 AM   Pt seen and d/c'd when EPIC down for upgrade- late note.  I spoke with and examined patient and agree with resident/PA/SNM's note and plan of care. Cat 1/reactive NST, w/ good fm perceived once on efm. Occ uc's- no pain. D/C home w/ fkc, ptl s/s, keep next appt as scheduled Roma Schanz, CNM, [facility] 09/16/2015 7:05 AM

## 2015-09-16 NOTE — MAU Note (Signed)
Pt reports not feeling the baby move most of the day. Took 3 hrs to get 10 movements. Reports occasional cts but no pain.

## 2015-09-16 NOTE — MAU Note (Signed)
Have not felt baby move for a few hours

## 2015-09-16 NOTE — MAU Provider Note (Signed)
History   Kristen [name] is in with second episode of decreased fetal movement in 24 hrs. No fetal movement all day long despite juice, exercise.  Pt GDM.  CSN: [id number]  Arrival date & time 09/16/15  1716   None     Chief Complaint  Patient presents with  . Decreased Fetal Movement    HPI  Past Medical History:  Diagnosis Date  . Alpha thalassemia ([place])   . Asthma   . Complication of anesthesia   . Endometriosis   . Preterm labor with preterm delivery     Past Surgical History:  Procedure Laterality Date  . CESAREAN SECTION    . DILATION AND CURETTAGE OF UTERUS      Family History  Problem Relation Age of Onset  . HIV Father     Social History  Substance Use Topics  . Smoking status: Former Research scientist (life sciences)  . Smokeless tobacco: Never Used  . Alcohol use No     Comment: occ    OB History    Gravida Para Term Preterm AB Living   12 4 2 2 8 4    SAB TAB Ectopic Multiple Live Births   0 0 0 1        Review of Systems  Constitutional: Negative.   HENT: Negative.   Eyes: Negative.   Respiratory: Negative.   Cardiovascular: Negative.   Gastrointestinal: Negative.   Endocrine: Negative.   Genitourinary: Negative.   Musculoskeletal: Negative.   Skin: Negative.   Allergic/Immunologic: Negative.   Neurological: Negative.   Psychiatric/Behavioral: Negative.     Allergies  Amoxicillin; Doxycycline; Percocet [oxycodone-acetaminophen]; Nerve block tray; and Penicillins  Home Medications    BP 138/84 (BP Location: Left Arm)   Pulse 112   Temp 98.3 F (36.8 C) (Oral)   Resp 18   Ht 5\' 3"  (1.6 m)   Wt 241 lb 12.8 oz (109.7 kg)   LMP 02/04/2015   BMI 42.83 kg/m   Physical Exam  Constitutional: She is oriented to person, place, and time. She appears well-developed and well-nourished.  HENT:  Head: Normocephalic.  Eyes: Pupils are equal, round, and reactive to light.  Neck: Normal range of motion.  Cardiovascular: Normal rate, regular rhythm, normal  heart sounds and intact distal pulses.   Pulmonary/Chest: Effort normal and breath sounds normal.  Abdominal: Soft. Bowel sounds are normal.  Musculoskeletal: Normal range of motion.  Neurological: She is alert and oriented to person, place, and time. She has normal reflexes.  Skin: Skin is warm and dry.  Psychiatric: She has a normal mood and affect. Her behavior is normal. Judgment and thought content normal.    MAU Course  Procedures (including critical care time)  Labs Reviewed - No data to display No results found.   1. Decreased fetal movement determined by examination, third trimester, fetus 5       MDM  BPP 8/8 will d/c home

## 2015-09-16 NOTE — Discharge Instructions (Signed)
Fetal Movement Counts  Patient Name: __________________________________________________ Patient Due Date: ____________________  Performing a fetal movement count is highly recommended in high-risk pregnancies, but it is good for every pregnant woman to do. Your health care provider may ask you to start counting fetal movements at 28 weeks of the pregnancy. Fetal movements often increase:  · After eating a full meal.  · After physical activity.  · After eating or drinking something sweet or cold.  · At rest.  Pay attention to when you feel the baby is most active. This will help you notice a pattern of your baby's sleep and wake cycles and what factors contribute to an increase in fetal movement. It is important to perform a fetal movement count at the same time each day when your baby is normally most active.   HOW TO COUNT FETAL MOVEMENTS  1. Find a quiet and comfortable area to sit or lie down on your left side. Lying on your left side provides the best blood and oxygen circulation to your baby.  2. Write down the day and time on a sheet of paper or in a journal.  3. Start counting kicks, flutters, swishes, rolls, or jabs in a 2-hour period. You should feel at least 10 movements within 2 hours.  4. If you do not feel 10 movements in 2 hours, wait 2-3 hours and count again. Look for a change in the pattern or not enough counts in 2 hours.  SEEK MEDICAL CARE IF:  · You feel less than 10 counts in 2 hours, tried twice.  · There is no movement in over an hour.  · The pattern is changing or taking longer each day to reach 10 counts in 2 hours.  · You feel the baby is not moving as he or she usually does.  Date: ____________ Movements: ____________ Start time: ____________ Finish time: ____________   Date: ____________ Movements: ____________ Start time: ____________ Finish time: ____________  Date: ____________ Movements: ____________ Start time: ____________ Finish time: ____________  Date: ____________ Movements:  ____________ Start time: ____________ Finish time: ____________  Date: ____________ Movements: ____________ Start time: ____________ Finish time: ____________  Date: ____________ Movements: ____________ Start time: ____________ Finish time: ____________  Date: ____________ Movements: ____________ Start time: ____________ Finish time: ____________  Date: ____________ Movements: ____________ Start time: ____________ Finish time: ____________   Date: ____________ Movements: ____________ Start time: ____________ Finish time: ____________  Date: ____________ Movements: ____________ Start time: ____________ Finish time: ____________  Date: ____________ Movements: ____________ Start time: ____________ Finish time: ____________  Date: ____________ Movements: ____________ Start time: ____________ Finish time: ____________  Date: ____________ Movements: ____________ Start time: ____________ Finish time: ____________  Date: ____________ Movements: ____________ Start time: ____________ Finish time: ____________  Date: ____________ Movements: ____________ Start time: ____________ Finish time: ____________   Date: ____________ Movements: ____________ Start time: ____________ Finish time: ____________  Date: ____________ Movements: ____________ Start time: ____________ Finish time: ____________  Date: ____________ Movements: ____________ Start time: ____________ Finish time: ____________  Date: ____________ Movements: ____________ Start time: ____________ Finish time: ____________  Date: ____________ Movements: ____________ Start time: ____________ Finish time: ____________  Date: ____________ Movements: ____________ Start time: ____________ Finish time: ____________  Date: ____________ Movements: ____________ Start time: ____________ Finish time: ____________   Date: ____________ Movements: ____________ Start time: ____________ Finish time: ____________  Date: ____________ Movements: ____________ Start time: ____________ Finish  time: ____________  Date: ____________ Movements: ____________ Start time: ____________ Finish time: ____________  Date: ____________ Movements: ____________ Start time:   ____________ Finish time: ____________  Date: ____________ Movements: ____________ Start time: ____________ Finish time: ____________  Date: ____________ Movements: ____________ Start time: ____________ Finish time: ____________  Date: ____________ Movements: ____________ Start time: ____________ Finish time: ____________   Date: ____________ Movements: ____________ Start time: ____________ Finish time: ____________  Date: ____________ Movements: ____________ Start time: ____________ Finish time: ____________  Date: ____________ Movements: ____________ Start time: ____________ Finish time: ____________  Date: ____________ Movements: ____________ Start time: ____________ Finish time: ____________  Date: ____________ Movements: ____________ Start time: ____________ Finish time: ____________  Date: ____________ Movements: ____________ Start time: ____________ Finish time: ____________  Date: ____________ Movements: ____________ Start time: ____________ Finish time: ____________   Date: ____________ Movements: ____________ Start time: ____________ Finish time: ____________  Date: ____________ Movements: ____________ Start time: ____________ Finish time: ____________  Date: ____________ Movements: ____________ Start time: ____________ Finish time: ____________  Date: ____________ Movements: ____________ Start time: ____________ Finish time: ____________  Date: ____________ Movements: ____________ Start time: ____________ Finish time: ____________  Date: ____________ Movements: ____________ Start time: ____________ Finish time: ____________  Date: ____________ Movements: ____________ Start time: ____________ Finish time: ____________   Date: ____________ Movements: ____________ Start time: ____________ Finish time: ____________  Date: ____________  Movements: ____________ Start time: ____________ Finish time: ____________  Date: ____________ Movements: ____________ Start time: ____________ Finish time: ____________  Date: ____________ Movements: ____________ Start time: ____________ Finish time: ____________  Date: ____________ Movements: ____________ Start time: ____________ Finish time: ____________  Date: ____________ Movements: ____________ Start time: ____________ Finish time: ____________  Date: ____________ Movements: ____________ Start time: ____________ Finish time: ____________   Date: ____________ Movements: ____________ Start time: ____________ Finish time: ____________  Date: ____________ Movements: ____________ Start time: ____________ Finish time: ____________  Date: ____________ Movements: ____________ Start time: ____________ Finish time: ____________  Date: ____________ Movements: ____________ Start time: ____________ Finish time: ____________  Date: ____________ Movements: ____________ Start time: ____________ Finish time: ____________  Date: ____________ Movements: ____________ Start time: ____________ Finish time: ____________     This information is not intended to replace advice given to you by your health care provider. Make sure you discuss any questions you have with your health care provider.     Document Released: 03/12/2006 Document Revised: 03/03/2014 Document Reviewed: 12/08/2011  Elsevier Interactive Patient Education ©2016 [employer]

## 2015-09-19 ENCOUNTER — Ambulatory Visit (INDEPENDENT_AMBULATORY_CARE_PROVIDER_SITE_OTHER): Payer: Medicaid Other | Admitting: Obstetrics

## 2015-09-19 ENCOUNTER — Ambulatory Visit (HOSPITAL_COMMUNITY)
Admission: RE | Admit: 2015-09-19 | Discharge: 2015-09-19 | Disposition: A | Discharge status: Home / Self Care | Payer: Medicaid Other | Source: Ambulatory Visit | Attending: Obstetrics | Admitting: Obstetrics

## 2015-09-19 ENCOUNTER — Encounter (HOSPITAL_COMMUNITY): Payer: Self-pay

## 2015-09-19 DIAGNOSIS — Z3A32 32 weeks gestation of pregnancy: Secondary | ICD-10-CM | POA: Insufficient documentation

## 2015-09-19 DIAGNOSIS — O09213 Supervision of pregnancy with history of pre-term labor, third trimester: Secondary | ICD-10-CM | POA: Diagnosis not present

## 2015-09-19 DIAGNOSIS — O99213 Obesity complicating pregnancy, third trimester: Secondary | ICD-10-CM | POA: Insufficient documentation

## 2015-09-19 DIAGNOSIS — O365930 Maternal care for other known or suspected poor fetal growth, third trimester, not applicable or unspecified: Secondary | ICD-10-CM | POA: Diagnosis not present

## 2015-09-19 DIAGNOSIS — O0993 Supervision of high risk pregnancy, unspecified, third trimester: Secondary | ICD-10-CM

## 2015-09-19 DIAGNOSIS — O34219 Maternal care for unspecified type scar from previous cesarean delivery: Secondary | ICD-10-CM | POA: Insufficient documentation

## 2015-09-19 DIAGNOSIS — O24419 Gestational diabetes mellitus in pregnancy, unspecified control: Secondary | ICD-10-CM | POA: Insufficient documentation

## 2015-09-24 ENCOUNTER — Ambulatory Visit (INDEPENDENT_AMBULATORY_CARE_PROVIDER_SITE_OTHER): Payer: Medicaid Other | Admitting: Obstetrics & Gynecology

## 2015-09-24 ENCOUNTER — Encounter: Payer: Self-pay | Admitting: Obstetrics

## 2015-09-24 VITALS — BP 102/67 | HR 98 | Temp 97.8°F | Wt 241.9 lb

## 2015-09-24 DIAGNOSIS — O09213 Supervision of pregnancy with history of pre-term labor, third trimester: Secondary | ICD-10-CM

## 2015-09-24 DIAGNOSIS — O34219 Maternal care for unspecified type scar from previous cesarean delivery: Secondary | ICD-10-CM

## 2015-09-24 DIAGNOSIS — O365990 Maternal care for other known or suspected poor fetal growth, unspecified trimester, not applicable or unspecified: Secondary | ICD-10-CM | POA: Insufficient documentation

## 2015-09-24 DIAGNOSIS — O99210 Obesity complicating pregnancy, unspecified trimester: Secondary | ICD-10-CM | POA: Insufficient documentation

## 2015-09-24 DIAGNOSIS — O365931 Maternal care for other known or suspected poor fetal growth, third trimester, fetus 1: Secondary | ICD-10-CM

## 2015-09-24 DIAGNOSIS — O0993 Supervision of high risk pregnancy, unspecified, third trimester: Secondary | ICD-10-CM

## 2015-09-24 DIAGNOSIS — O09219 Supervision of pregnancy with history of pre-term labor, unspecified trimester: Secondary | ICD-10-CM | POA: Insufficient documentation

## 2015-09-24 DIAGNOSIS — O24415 Gestational diabetes mellitus in pregnancy, controlled by oral hypoglycemic drugs: Secondary | ICD-10-CM

## 2015-09-24 DIAGNOSIS — O24419 Gestational diabetes mellitus in pregnancy, unspecified control: Secondary | ICD-10-CM | POA: Insufficient documentation

## 2015-09-24 LAB — POCT URINALYSIS DIPSTICK
BILIRUBIN UA: NEGATIVE
Blood, UA: NEGATIVE
Glucose, UA: NEGATIVE
KETONES UA: NEGATIVE
Nitrite, UA: NEGATIVE
PH UA: 6
SPEC GRAV UA: 1.02
Urobilinogen, UA: 0.2

## 2015-09-24 MED ORDER — HYDROXYPROGESTERONE CAPROATE 250 MG/ML IM OIL
250.0000 mg | TOPICAL_OIL | Freq: Once | INTRAMUSCULAR | Status: AC
  Administered 2015-09-24: 250 mg via INTRAMUSCULAR

## 2015-09-24 NOTE — Progress Notes (Signed)
Patient here for 17-OH P injection only.

## 2015-09-24 NOTE — Progress Notes (Signed)
Subjective:  Kristen [name] is a 35 y.o. [id number] at [redacted]w[redacted]d being seen today for ongoing prenatal care.  She is currently monitored for the following issues for this high-risk pregnancy and has Supervision of high risk pregnancy in third trimester; SGA (small for gestational age), fetal, affecting care of mother, antepartum; Previous cesarean delivery, antepartum; Previous preterm delivery x 2, antepartum; Obesity affecting pregnancy, antepartum; and Gestational diabetes mellitus, antepartum on her problem list.  Patient reports "baby moving less".  Contractions: Irregular. Vag. Bleeding: None.  Movement: Present. Denies leaking of fluid.   The following portions of the patient's history were reviewed and updated as appropriate: allergies, current medications, past family history, past medical history, past social history, past surgical history and problem list. Problem list updated.  Objective:   Vitals:   09/24/15 1006  BP: 102/67  Pulse: 98  Temp: 97.8 F (36.6 C)  Weight: 241 lb 14.4 oz (109.7 kg)    Fetal Status:     Movement: Present     General:  Alert, oriented and cooperative. Patient is in no acute distress.  Skin: Skin is warm and dry. No rash noted.   Cardiovascular: Normal heart rate noted  Respiratory: Normal respiratory effort, no problems with respiration noted  Abdomen: Soft, gravid, appropriate for gestational age. Pain/Pressure: Absent     Pelvic:  Cervical exam deferred        Extremities: Normal range of motion.  Edema: None  Mental Status: Normal mood and affect. Normal behavior. Normal judgment and thought content.   Urinalysis: Urine Protein: Negative Urine Glucose: Negative NST performed today was reviewed and was found to be reactive.  Continue recommended antenatal testing and prenatal care.  Assessment and Plan:  Pregnancy: [id number] at [redacted]w[redacted]d  1. Gestational diabetes mellitus (GDM) in third trimester controlled on oral hypoglycemic drug Did not  bring sugar log, reports good BS. Continue Metformin as prescribed. Continue antenatal testing.  2. SGA (small for gestational age), fetal, affecting care of mother, antepartum, third trimester, fetus 1 Already scheduled for weekly BPP and follow up scans as per MFM. EFW 22% at 31 weeks, AC 5%. H/O IUGR previous pregnancy.  3. Previous cesarean delivery, antepartum Desires TOLAC, counseling done and consent signed today.  4. Previous preterm delivery, antepartum, third trimester Continue weekly 17P - hydroxyprogesterone caproate (MAKENA) 250 mg/mL injection 250 mg; Inject 1 mL (250 mg total) into the muscle once.  5. Supervision of high risk pregnancy in third trimester Preterm labor symptoms and general obstetric precautions including but not limited to vaginal bleeding, contractions, leaking of fluid and fetal movement were reviewed in detail with the patient. Please refer to After Visit Summary for other counseling recommendations.  Return in about 1 week (around 10/01/2015) for OB Visit, NST.   Osborne Oman, MD

## 2015-09-24 NOTE — Progress Notes (Signed)
Pt states baby is moving much less than before. She states [place] changed GA to [redacted]w[redacted]d at last visit.

## 2015-09-24 NOTE — Patient Instructions (Signed)
Return to clinic for any scheduled appointments or obstetric concerns, or go to MAU for evaluation  

## 2015-09-24 NOTE — Addendum Note (Signed)
Addended by: Dorothyann Gibbs on: 09/24/2015 11:55 AM   Modules accepted: Orders

## 2015-09-25 ENCOUNTER — Ambulatory Visit (HOSPITAL_COMMUNITY): Payer: Medicaid Other

## 2015-09-26 ENCOUNTER — Encounter (HOSPITAL_COMMUNITY): Payer: Self-pay | Admitting: *Deleted

## 2015-09-26 ENCOUNTER — Inpatient Hospital Stay (HOSPITAL_COMMUNITY)
Admission: AD | Admit: 2015-09-26 | Discharge: 2015-09-26 | Disposition: A | Discharge status: Home / Self Care | Payer: Medicaid Other | Source: Ambulatory Visit | Attending: Obstetrics and Gynecology | Admitting: Obstetrics and Gynecology

## 2015-09-26 ENCOUNTER — Telehealth: Payer: Self-pay | Admitting: *Deleted

## 2015-09-26 DIAGNOSIS — O4703 False labor before 37 completed weeks of gestation, third trimester: Secondary | ICD-10-CM | POA: Insufficient documentation

## 2015-09-26 DIAGNOSIS — Z880 Allergy status to penicillin: Secondary | ICD-10-CM | POA: Insufficient documentation

## 2015-09-26 DIAGNOSIS — J45909 Unspecified asthma, uncomplicated: Secondary | ICD-10-CM | POA: Insufficient documentation

## 2015-09-26 DIAGNOSIS — N949 Unspecified condition associated with female genital organs and menstrual cycle: Secondary | ICD-10-CM

## 2015-09-26 DIAGNOSIS — Z87891 Personal history of nicotine dependence: Secondary | ICD-10-CM | POA: Insufficient documentation

## 2015-09-26 DIAGNOSIS — O99513 Diseases of the respiratory system complicating pregnancy, third trimester: Secondary | ICD-10-CM | POA: Diagnosis not present

## 2015-09-26 DIAGNOSIS — Z3A33 33 weeks gestation of pregnancy: Secondary | ICD-10-CM

## 2015-09-26 DIAGNOSIS — O479 False labor, unspecified: Secondary | ICD-10-CM

## 2015-09-26 LAB — URINALYSIS, ROUTINE W REFLEX MICROSCOPIC
BILIRUBIN URINE: NEGATIVE
GLUCOSE, UA: NEGATIVE mg/dL
HGB URINE DIPSTICK: NEGATIVE
KETONES UR: NEGATIVE mg/dL
Leukocytes, UA: NEGATIVE
Nitrite: NEGATIVE
PROTEIN: NEGATIVE mg/dL
Specific Gravity, Urine: 1.025 (ref 1.005–1.030)
pH: 6 (ref 5.0–8.0)

## 2015-09-26 LAB — GLUCOSE, CAPILLARY: Glucose-Capillary: 119 mg/dL — ABNORMAL HIGH (ref 65–99)

## 2015-09-26 LAB — FETAL FIBRONECTIN: FETAL FIBRONECTIN: NEGATIVE

## 2015-09-26 NOTE — MAU Note (Signed)
Pt c/o pelvic pressure x 2 days. Started having irregular contractions today anywhere from 3-15 mins apart. States that she is not currently having any contractions. Denies LOF or vag bleeding. +FM.

## 2015-09-26 NOTE — Telephone Encounter (Signed)
Patient reports she has been having a lot of pressure down low. She also states she has been having an increase in the amount of contractions she has been having daily. Encouraged patient to go to the hospital to get checked out-she does have an appointment tomorrow- but it is in the afternoon and that may be too late. Patient voiced understanding.

## 2015-09-26 NOTE — MAU Provider Note (Addendum)
History     CSN: [id number]  Arrival date and time: 09/26/15 2045   None     Chief Complaint  Patient presents with  . Contractions   HPI  35 year old G2 12 para 2284 at 32 weeks and 3 days presents for having some contractions that were strong but not very frequent. Her biggest complaint is that she's having tremendous pelvic pressure. She denies loss of fluid or vaginal bleeding. She denies any symptoms of dysuria. Is not taking anything to make the pain go away. Nothing makes the pain worse.   Past Medical History:  Diagnosis Date  . Alpha thalassemia ([place])   . Asthma   . Complication of anesthesia   . Endometriosis   . Preterm labor with preterm delivery     Past Surgical History:  Procedure Laterality Date  . CESAREAN SECTION    . DILATION AND CURETTAGE OF UTERUS      Family History  Problem Relation Age of Onset  . HIV Father     Social History  Substance Use Topics  . Smoking status: Former Research scientist (life sciences)  . Smokeless tobacco: Never Used  . Alcohol use No     Comment: occ    Allergies:  Allergies  Allergen Reactions  . Amoxicillin Hives and Shortness Of Breath  . Doxycycline Hives and Shortness Of Breath  . Percocet [Oxycodone-Acetaminophen] Anaphylaxis  . Nerve Block Tray Itching and Other (See Comments)    burning  . Penicillins Hives    Has patient had a PCN reaction causing immediate rash, facial/tongue/throat swelling, SOB or lightheadedness with hypotension: Yes Has patient had a PCN reaction causing severe rash involving mucus membranes or skin necrosis: No Has patient had a PCN reaction that required hospitalization Yes Has patient had a PCN reaction occurring within the last 10 years: Yes If all of the above answers are "NO", then may proceed with Cephalosporin use.    Facility-Administered Medications Prior to Admission  Medication Dose Route Frequency Provider Last Rate Last Dose  . hydroxyprogesterone caproate (MAKENA) 250 mg/mL injection 250  mg  250 mg Intramuscular Weekly Rachelle A Denney, CNM   250 mg at 09/19/15 1438   Prescriptions Prior to Admission  Medication Sig Dispense Refill Last Dose  . metFORMIN (GLUCOPHAGE XR) 500 MG 24 hr tablet Take 2 tablets (1,000 mg total) by mouth 2 (two) times daily at 8 am and 10 pm. 120 tablet 11 09/26/2015 at Unknown time  . mometasone (NASONEX) 50 MCG/ACT nasal spray Place 2 sprays into the nose daily. 17 g 12 09/25/2015 at Unknown time  . omeprazole (PRILOSEC) 20 MG capsule Take 1 capsule (20 mg total) by mouth 2 (two) times daily before a meal. 60 capsule 5 09/25/2015 at Unknown time  . Prenat-FeFmCb-DSS-FA-DHA w/o A (CITRANATAL HARMONY) 27-1-260 MG CAPS Take 1 capsule by mouth daily. 30 capsule 11 09/25/2015 at Unknown time  . albuterol (PROVENTIL HFA;VENTOLIN HFA) 108 (90 BASE) MCG/ACT inhaler Inhale 2 puffs into the lungs every 6 (six) hours as needed for wheezing or shortness of breath. Reported on 05/02/2015   [facility]  . loratadine (CLARITIN) 10 MG tablet Take 1 tablet (10 mg total) by mouth daily. (Patient not taking: Reported on 09/19/2015) 30 tablet 11 Not Taking at Unknown time  . promethazine (PHENERGAN) 25 MG tablet Take 1 tablet (25 mg total) by mouth every 6 (six) hours as needed for nausea or vomiting. (Patient not taking: Reported on 09/04/2015) 30 tablet 2 Not Taking at Unknown time  . terconazole (  TERAZOL 3) 0.8 % vaginal cream Place 1 applicator vaginally at bedtime. (Patient not taking: Reported on 09/19/2015) 20 g 0 Completed Course at Unknown time    Review of Systems  Respiratory: Negative.   Cardiovascular: Negative.   Gastrointestinal: Negative.   Genitourinary:       Pelvic pressure  Psychiatric/Behavioral: Negative.    Physical Exam   Blood pressure 119/73, pulse 100, temperature 98.1 F (36.7 C), temperature source Oral, resp. rate 18, height 5\' 3"  (1.6 m), weight 243 lb (110.2 kg), last menstrual period 02/04/2015, SpO2 98 %.  Physical Exam  Vitals  reviewed. Constitutional: She is oriented to person, place, and time. She appears well-developed and well-nourished. No distress.  HENT:  Head: Normocephalic and atraumatic.  Eyes: Conjunctivae are normal.  Neck: No thyromegaly present.  Cardiovascular: Normal rate and regular rhythm.   Respiratory: Effort normal and breath sounds normal.  GI: Soft. There is no tenderness. There is no rebound and no guarding.  Genitourinary:  Genitourinary Comments: Vulva appears normal Cervix 1 cm long posterior  Musculoskeletal: She exhibits no tenderness or deformity.  Neurological: She is alert and oriented to person, place, and time.  Skin: Skin is warm and dry.  Psychiatric: She has a normal mood and affect.   Results for orders placed or performed during the hospital encounter of 09/26/15 (from the past 24 hour(s))  Urinalysis, Routine w reflex microscopic (not at [facility])     Status: None   Collection Time: 09/26/15  8:55 PM  Result Value Ref Range   Color, Urine YELLOW YELLOW   APPearance CLEAR CLEAR   Specific Gravity, Urine 1.025 1.005 - 1.030   pH 6.0 5.0 - 8.0   Glucose, UA NEGATIVE NEGATIVE mg/dL   Hgb urine dipstick NEGATIVE NEGATIVE   Bilirubin Urine NEGATIVE NEGATIVE   Ketones, ur NEGATIVE NEGATIVE mg/dL   Protein, ur NEGATIVE NEGATIVE mg/dL   Nitrite NEGATIVE NEGATIVE   Leukocytes, UA NEGATIVE NEGATIVE  Glucose, capillary     Status: Abnormal   Collection Time: 09/26/15  9:14 PM  Result Value Ref Range   Glucose-Capillary 119 (H) 65 - 99 mg/dL  Fetal fibronectin     Status: None   Collection Time: 09/26/15  9:38 PM  Result Value Ref Range   Fetal Fibronectin NEGATIVE NEGATIVE    FHT: 140, moderate with 15x15 accels, no decels Toco: rare UC  MAU Course  Procedures  MDM Determine preterm labor or normal pregnancy pain. FFN and urine are negative.   Assessment and Plan  35 year old female G 12 para 2284 at 32 weeks and 3 days with pelvic pressure and rare  contraction 1-FFN sent 2-patient turned over to  [facility].  He will follow up on FFFN and make appropriate disposition of patient.  1. Round ligament pain   2. [redacted] weeks gestation of pregnancy   3. Braxton Hicks contractions    DC home Comfort measures reviewed  3rd Trimester precautions  PTL precautions  Fetal kick counts RX: none  Return to MAU as needed  [place] [facility] .   Specialty:  Obstetrics and Gynecology Contact information: [place] [phone]         Mathis Bud  10:39 PM 09/26/15    LEGGETT,KELLY H. 09/26/2015, 9:42 PM

## 2015-09-26 NOTE — Discharge Instructions (Signed)

## 2015-09-27 ENCOUNTER — Encounter (HOSPITAL_COMMUNITY): Payer: Self-pay | Admitting: *Deleted

## 2015-09-27 ENCOUNTER — Ambulatory Visit (HOSPITAL_COMMUNITY)
Admission: RE | Admit: 2015-09-27 | Discharge: 2015-09-27 | Disposition: A | Discharge status: Home / Self Care | Payer: Medicaid Other | Source: Ambulatory Visit | Attending: Obstetrics | Admitting: Obstetrics

## 2015-09-27 ENCOUNTER — Encounter (HOSPITAL_COMMUNITY): Payer: Self-pay

## 2015-09-27 ENCOUNTER — Inpatient Hospital Stay (HOSPITAL_COMMUNITY)
Admission: AD | Admit: 2015-09-27 | Discharge: 2015-09-27 | Disposition: A | Discharge status: Home / Self Care | Payer: Medicaid Other | Source: Ambulatory Visit | Attending: Family Medicine | Admitting: Family Medicine

## 2015-09-27 DIAGNOSIS — Z880 Allergy status to penicillin: Secondary | ICD-10-CM | POA: Diagnosis not present

## 2015-09-27 DIAGNOSIS — O365930 Maternal care for other known or suspected poor fetal growth, third trimester, not applicable or unspecified: Secondary | ICD-10-CM | POA: Insufficient documentation

## 2015-09-27 DIAGNOSIS — O24419 Gestational diabetes mellitus in pregnancy, unspecified control: Secondary | ICD-10-CM | POA: Insufficient documentation

## 2015-09-27 DIAGNOSIS — Z87891 Personal history of nicotine dependence: Secondary | ICD-10-CM | POA: Diagnosis not present

## 2015-09-27 DIAGNOSIS — O365931 Maternal care for other known or suspected poor fetal growth, third trimester, fetus 1: Secondary | ICD-10-CM

## 2015-09-27 DIAGNOSIS — O24415 Gestational diabetes mellitus in pregnancy, controlled by oral hypoglycemic drugs: Secondary | ICD-10-CM | POA: Diagnosis not present

## 2015-09-27 DIAGNOSIS — Z3A33 33 weeks gestation of pregnancy: Secondary | ICD-10-CM | POA: Insufficient documentation

## 2015-09-27 DIAGNOSIS — O4703 False labor before 37 completed weeks of gestation, third trimester: Secondary | ICD-10-CM | POA: Diagnosis not present

## 2015-09-27 DIAGNOSIS — O34219 Maternal care for unspecified type scar from previous cesarean delivery: Secondary | ICD-10-CM | POA: Insufficient documentation

## 2015-09-27 DIAGNOSIS — O99213 Obesity complicating pregnancy, third trimester: Secondary | ICD-10-CM | POA: Insufficient documentation

## 2015-09-27 DIAGNOSIS — O09213 Supervision of pregnancy with history of pre-term labor, third trimester: Secondary | ICD-10-CM | POA: Insufficient documentation

## 2015-09-27 DIAGNOSIS — O99210 Obesity complicating pregnancy, unspecified trimester: Secondary | ICD-10-CM

## 2015-09-27 DIAGNOSIS — IMO0002 Reserved for concepts with insufficient information to code with codable children: Secondary | ICD-10-CM

## 2015-09-27 HISTORY — DX: Gestational diabetes mellitus in pregnancy, unspecified control: O24.419

## 2015-09-27 LAB — URINALYSIS, ROUTINE W REFLEX MICROSCOPIC
BILIRUBIN URINE: NEGATIVE
GLUCOSE, UA: NEGATIVE mg/dL
Hgb urine dipstick: NEGATIVE
KETONES UR: NEGATIVE mg/dL
LEUKOCYTES UA: NEGATIVE
Nitrite: NEGATIVE
PH: 7 (ref 5.0–8.0)
Protein, ur: NEGATIVE mg/dL
SPECIFIC GRAVITY, URINE: 1.015 (ref 1.005–1.030)

## 2015-09-27 LAB — GLUCOSE, CAPILLARY: Glucose-Capillary: 160 mg/dL — ABNORMAL HIGH (ref 65–99)

## 2015-09-27 MED ORDER — INSULIN ASPART PROT & ASPART (70-30 MIX) 100 UNIT/ML ~~LOC~~ SUSP
10.0000 [IU] | Freq: Once | SUBCUTANEOUS | Status: DC
  Filled 2015-09-27: qty 10

## 2015-09-27 MED ORDER — INSULIN NPH (HUMAN) (ISOPHANE) 100 UNIT/ML ~~LOC~~ SUSP: 10.0000 [IU] | Freq: Two times a day (BID) | SUBCUTANEOUS | 11 refills | Status: DC

## 2015-09-27 MED ORDER — INSULIN ASPART PROT & ASPART (70-30 MIX) 100 UNIT/ML ~~LOC~~ SUSP
12.0000 [IU] | Freq: Two times a day (BID) | SUBCUTANEOUS | 11 refills | Status: DC
Start: 2015-09-27 — End: 2015-09-27

## 2015-09-27 NOTE — MAU Note (Signed)
Took Metformin at 0200 when she got home.  At 1000 when she got up this morning- it was 189.  2hrs after breakfast 267/295, repeat in 1 hr 184. Called MD and was told to come in. Woke up really thirsty, states has been drinking a lot. Last couple days has been higher after lunch

## 2015-09-27 NOTE — Discharge Instructions (Signed)
Please continue to take your metformin. Please take 12 unit of 70/30 insulin before breakfast and before dinner. Please keep a log of your sugars written and bring to your next appointment so your insulin can be adjusted.   Gestational Diabetes Mellitus Gestational diabetes mellitus, often simply referred to as gestational diabetes, is a type of diabetes that some women develop during pregnancy. In gestational diabetes, the pancreas does not make enough insulin (a hormone), the cells are less responsive to the insulin that is made (insulin resistance), or both.Normally, insulin moves sugars from food into the tissue cells. The tissue cells use the sugars for energy. The lack of insulin or the lack of normal response to insulin causes excess sugars to build up in the blood instead of going into the tissue cells. As a result, high blood sugar (hyperglycemia) develops. The effect of high sugar (glucose) levels can cause many problems.  RISK FACTORS You have an increased chance of developing gestational diabetes if you have a family history of diabetes and also have one or more of the following risk factors:  A body mass index over 30 (obesity).  A previous pregnancy with gestational diabetes.  An older age at the time of pregnancy. If blood glucose levels are kept in the normal range during pregnancy, women can have a healthy pregnancy. If your blood glucose levels are not well controlled, there may be risks to you, your unborn baby (fetus), your labor and delivery, or your newborn baby.  SYMPTOMS  If symptoms are experienced, they are much like symptoms you would normally expect during pregnancy. The symptoms of gestational diabetes include:   Increased thirst (polydipsia).  Increased urination (polyuria).  Increased urination during the night (nocturia).  Weight loss. This weight loss may be rapid.  Frequent, recurring infections.  Tiredness (fatigue).  Weakness.  Vision changes, such  as blurred vision.  Fruity smell to your breath.  Abdominal pain. DIAGNOSIS Diabetes is diagnosed when blood glucose levels are increased. Your blood glucose level may be checked by one or more of the following blood tests:  A fasting blood glucose test. You will not be allowed to eat for at least 8 hours before a blood sample is taken.  A random blood glucose test. Your blood glucose is checked at any time of the day regardless of when you ate.  An oral glucose tolerance test (OGTT). Your blood glucose is measured after you have not eaten (fasted) for 1-3 hours and then after you drink a glucose-containing beverage. Since the hormones that cause insulin resistance are highest at about 24-28 weeks of a pregnancy, an OGTT is usually performed during that time. If you have risk factors, you may be screened for undiagnosed type 2 diabetes at your first prenatal visit. TREATMENT  Gestational diabetes should be managed first with diet and exercise. Medicines may be added only if they are needed.  You will need to take diabetes medicine or insulin daily to keep blood glucose levels in the desired range.  You will need to match insulin dosing with exercise and healthy food choices. If you have gestational diabetes, your treatment goal is to maintain the following blood glucose levels:  Before meals (preprandial): at or below 95 mg/dL.  After meals (postprandial):  One hour after a meal: at or below 140 mg/dL.  Two hours after a meal: at or below 120 mg/dL. If you have pre-existing type 1 or type 2 diabetes, your treatment goal is to maintain the following blood glucose  levels:  Before meals, at bedtime, and overnight: 60-99 mg/dL.  After meals: peak of 100-129 mg/dL. HOME CARE INSTRUCTIONS   Have your hemoglobin A1c level checked twice a year.  Perform daily blood glucose monitoring as directed by your health care provider. It is common to perform frequent blood glucose  monitoring.  Monitor urine ketones when you are ill and as directed by your health care provider.  Take your diabetes medicine and insulin as directed by your health care provider to maintain your blood glucose level in the desired range.  Never run out of diabetes medicine or insulin. It is needed every day.  Adjust insulin based on your intake of carbohydrates. Carbohydrates can raise blood glucose levels but need to be included in your diet. Carbohydrates provide vitamins, minerals, and fiber which are an essential part of a healthy diet. Carbohydrates are found in fruits, vegetables, whole grains, dairy products, legumes, and foods containing added sugars.  Eat healthy foods. Alternate 3 meals with 3 snacks.  Maintain a healthy weight gain. The usual total expected weight gain varies according to your prepregnancy body mass index (BMI).  Carry a medical alert card or wear your medical alert jewelry.  Carry a 15-gram carbohydrate snack with you at all times to treat low blood glucose (hypoglycemia). Some examples of 15-gram carbohydrate snacks include:  Glucose tablets, 3 or 4.  Glucose gel, 15-gram tube.  Raisins, 2 tablespoons (24 g).  Jelly beans, 6.  Animal crackers, 8.  Fruit juice, regular soda, or low-fat milk, 4 ounces (120 mL).  Gummy treats, 9.  Recognize hypoglycemia. Hypoglycemia during pregnancy occurs with blood glucose levels of 60 mg/dL and below. The risk for hypoglycemia increases when fasting or skipping meals, during or after intense exercise, and during sleep. Hypoglycemia symptoms can include:  Tremors or shakes.  Decreased ability to concentrate.  Sweating.  Increased heart rate.  Headache.  Dry mouth.  Hunger.  Irritability.  Anxiety.  Restless sleep.  Altered speech or coordination.  Confusion.  Treat hypoglycemia promptly. If you are alert and able to safely swallow, follow the 15:15 rule:  Take 15-20 grams of rapid-acting  glucose or carbohydrate. Rapid-acting options include glucose gel, glucose tablets, or 4 ounces (120 mL) of fruit juice, regular soda, or low-fat milk.  Check your blood glucose level 15 minutes after taking the glucose.  Take 15-20 grams more of glucose if the repeat blood glucose level is still 70 mg/dL or below.  Eat a meal or snack within 1 hour once blood glucose levels return to normal.  Be alert to polyuria (excess urination) and polydipsia (excess thirst) which are early signs of hyperglycemia. An early awareness of hyperglycemia allows for prompt treatment. Treat hyperglycemia as directed by your health care provider.  Engage in at least 30 minutes of physical activity a day or as directed by your health care provider. Ten minutes of physical activity timed 30 minutes after each meal is encouraged to control postprandial blood glucose levels.  Adjust your insulin dosing and food intake as needed if you start a new exercise or sport.  Follow your sick-day plan at any time you are unable to eat or drink as usual.  Avoid tobacco and alcohol use.  Keep all follow-up visits as directed by your health care provider.  Follow the advice of your health care provider regarding your prenatal and post-delivery (postpartum) appointments, meal planning, exercise, medicines, vitamins, blood tests, other medical tests, and physical activities.  Perform daily skin and foot  care. Examine your skin and feet daily for cuts, bruises, redness, nail problems, bleeding, blisters, or sores.  Brush your teeth and gums at least twice a day and floss at least once a day. Follow up with your dentist regularly.  Schedule an eye exam during the first trimester of your pregnancy or as directed by your health care provider.  Share your diabetes management plan with your workplace or school.  Stay up-to-date with immunizations.  Learn to manage stress.  Obtain ongoing diabetes education and support as  needed.  Learn about and consider breastfeeding your baby.  You should have your blood sugar level checked 6-12 weeks after delivery. This is done with an oral glucose tolerance test (OGTT). SEEK MEDICAL CARE IF:   You are unable to eat food or drink fluids for more than 6 hours.  You have nausea and vomiting for more than 6 hours.  You have a blood glucose level of 200 mg/dL and you have ketones in your urine.  There is a change in mental status.  You develop vision problems.  You have a persistent headache.  You have upper abdominal pain or discomfort.  You develop an additional serious illness.  You have diarrhea for more than 6 hours.  You have been sick or have had a fever for a couple of days and are not getting better. SEEK IMMEDIATE MEDICAL CARE IF:   You have difficulty breathing.  You no longer feel the baby moving.  You are bleeding or have discharge from your vagina.  You start having premature contractions or labor. MAKE SURE YOU:  Understand these instructions.  Will watch your condition.  Will get help right away if you are not doing well or get worse.   This information is not intended to replace advice given to you by your health care provider. Make sure you discuss any questions you have with your health care provider.   Document Released: 05/19/2000 Document Revised: 03/03/2014 Document Reviewed: 09/09/2011 Elsevier Interactive Patient Education [employer].

## 2015-09-27 NOTE — MAU Provider Note (Signed)
History     CSN: [id number]  Arrival date and time: 09/27/15 1406   First Provider Initiated Contact with Patient 09/27/15 1437      Chief Complaint  Patient presents with  . Hyperglycemia   HPI   Patient is a 35 year old G54 P4 who presents today at the direction of Dr. Jacelyn Grip office for evaluation of uncontrolled gestational diabetes. She reports no history of diabetes personally with her previous pregnancies. She did have elevated blood pressure near the end of her last pregnancy. She had been placed on metformin in this pregnancy and until last week her sugars had all been ankle with fastings less than 90 and 2 hour postprandials less than 120. However over the past week or so her postprandials have slowly been increasing and today her fasting sugar was 160s and this afternoon there was elevated sugars greater than 250. Patient is very anxious as she has known IUGR and wants to progress right to insulin if we believe it will get her better control.  Patient does report polydipsia over the past several days as well as some feeling flushed.  OB History    Gravida Para Term Preterm AB Living   12 4 2 2 8 4    SAB TAB Ectopic Multiple Live Births   0 0 0 1 4      Past Medical History:  Diagnosis Date  . Alpha thalassemia ([place])   . Asthma   . Complication of anesthesia   . Endometriosis   . Gestational diabetes   . Preterm labor with preterm delivery     Past Surgical History:  Procedure Laterality Date  . CESAREAN SECTION    . DILATION AND CURETTAGE OF UTERUS      Family History  Problem Relation Age of Onset  . HIV Father     Social History  Substance Use Topics  . Smoking status: Former Research scientist (life sciences)  . Smokeless tobacco: Never Used  . Alcohol use No     Comment: occ    Allergies:  Allergies  Allergen Reactions  . Amoxicillin Hives and Shortness Of Breath  . Doxycycline Hives and Shortness Of Breath  . Percocet [Oxycodone-Acetaminophen] Anaphylaxis  . Nerve  Block Tray Itching and Other (See Comments)    burning  . Penicillins Hives    Has patient had a PCN reaction causing immediate rash, facial/tongue/throat swelling, SOB or lightheadedness with hypotension: Yes Has patient had a PCN reaction causing severe rash involving mucus membranes or skin necrosis: No Has patient had a PCN reaction that required hospitalization Yes Has patient had a PCN reaction occurring within the last 10 years: Yes If all of the above answers are "NO", then may proceed with Cephalosporin use.    Facility-Administered Medications Prior to Admission  Medication Dose Route Frequency Provider Last Rate Last Dose  . hydroxyprogesterone caproate (MAKENA) 250 mg/mL injection 250 mg  250 mg Intramuscular Weekly Rachelle A Denney, CNM   250 mg at 09/19/15 1438   Prescriptions Prior to Admission  Medication Sig Dispense Refill Last Dose  . metFORMIN (GLUCOPHAGE XR) 500 MG 24 hr tablet Take 2 tablets (1,000 mg total) by mouth 2 (two) times daily at 8 am and 10 pm. 120 tablet 11 09/27/2015 at Unknown time  . mometasone (NASONEX) 50 MCG/ACT nasal spray Place 2 sprays into the nose daily. (Patient taking differently: Place 2 sprays into the nose daily as needed (for sinus congestion/headache). ) 17 g 12 Past Week at Unknown time  . omeprazole (PRILOSEC)  20 MG capsule Take 1 capsule (20 mg total) by mouth 2 (two) times daily before a meal. 60 capsule 5 09/27/2015 at Unknown time  . Prenat-FeFmCb-DSS-FA-DHA w/o A (CITRANATAL HARMONY) 27-1-260 MG CAPS Take 1 capsule by mouth daily. (Patient taking differently: Take 1 capsule by mouth at bedtime. ) 30 capsule 11 09/26/2015 at Unknown time  . albuterol (PROVENTIL HFA;VENTOLIN HFA) 108 (90 BASE) MCG/ACT inhaler Inhale 2 puffs into the lungs every 6 (six) hours as needed for wheezing or shortness of breath. Reported on 05/02/2015   rescue    Review of Systems  Constitutional: Negative for chills, fever and weight loss.  HENT: Negative for ear  pain and nosebleeds.   Eyes: Negative for blurred vision, double vision and photophobia.  Respiratory: Negative for cough, hemoptysis and sputum production.   Cardiovascular: Negative for chest pain and palpitations.  Gastrointestinal: Negative for abdominal pain, heartburn, nausea and vomiting.  Genitourinary: Negative for dysuria and urgency.  Skin: Negative for rash.  Neurological: Positive for dizziness. Negative for sensory change, focal weakness and headaches.  Endo/Heme/Allergies: Negative for environmental allergies. Does not bruise/bleed easily.   Physical Exam   Blood pressure 124/79, pulse 107, temperature 98.1 F (36.7 C), temperature source Oral, resp. rate 20, weight 243 lb (110.2 kg), last menstrual period 02/04/2015.  Physical Exam  Constitutional: She is oriented to person, place, and time. She appears well-developed and well-nourished.  HENT:  Head: Normocephalic and atraumatic.  Eyes: Pupils are equal, round, and reactive to light.  Neck: Normal range of motion. Neck supple.  Cardiovascular: Normal rate, regular rhythm and normal heart sounds.   Respiratory: Breath sounds normal. No respiratory distress.  GI: Soft. Bowel sounds are normal. She exhibits no distension.  Gravid  Musculoskeletal: Normal range of motion. She exhibits edema.  Neurological: She is alert and oriented to person, place, and time. No cranial nerve deficit.  Skin: Skin is warm and dry.    Fetal heart tones: NST is reviewed and reactive  MAU Course  Procedures  MDM In the MAU patient's sugar was noted to be 160 she was very anxious to leave for her MFM ultrasound scheduled at 3 PM. She would like to make it down time if she can. If we give her dose of insulin here which I offered she'll have to stay for 20 minutes and will be late for her appointment. Patient is okay with taking of insulin this evening and starting at that time. She understands that if her sugars less than 100 she should not  give herself the insulin. She knows to look for lightheadedness and clamminess as signs of hypoglycemia. She will follow up with her primary obstetrician this week.  Assessment and Plan  Gestational diabetes mellitus: Patient currently on metformin. She can continue at this time. We will add NPH 10 units twice a day. Likely this will need to be increased but given her fluctuating sugars did not want start with 2 large overdose. Did offer patient glyburide instead of insulin but she is concerned and wants to get as quick control as she can.  Jacquiline Doe 09/27/2015, 3:07 PM

## 2015-09-28 ENCOUNTER — Other Ambulatory Visit (HOSPITAL_COMMUNITY): Payer: Self-pay | Admitting: *Deleted

## 2015-10-01 ENCOUNTER — Encounter: Payer: Self-pay | Admitting: Obstetrics

## 2015-10-01 ENCOUNTER — Ambulatory Visit (INDEPENDENT_AMBULATORY_CARE_PROVIDER_SITE_OTHER): Payer: Medicaid Other | Admitting: Obstetrics

## 2015-10-01 VITALS — BP 116/52 | HR 109 | Temp 97.8°F | Wt 239.0 lb

## 2015-10-01 DIAGNOSIS — O24913 Unspecified diabetes mellitus in pregnancy, third trimester: Secondary | ICD-10-CM

## 2015-10-01 DIAGNOSIS — O365931 Maternal care for other known or suspected poor fetal growth, third trimester, fetus 1: Secondary | ICD-10-CM

## 2015-10-01 DIAGNOSIS — O09213 Supervision of pregnancy with history of pre-term labor, third trimester: Secondary | ICD-10-CM | POA: Diagnosis not present

## 2015-10-01 DIAGNOSIS — O34219 Maternal care for unspecified type scar from previous cesarean delivery: Secondary | ICD-10-CM

## 2015-10-01 LAB — POCT URINALYSIS DIPSTICK
BILIRUBIN UA: NEGATIVE
Blood, UA: NEGATIVE
GLUCOSE UA: NEGATIVE
Ketones, UA: NEGATIVE
LEUKOCYTES UA: NEGATIVE
NITRITE UA: NEGATIVE
Spec Grav, UA: 1.015
Urobilinogen, UA: NEGATIVE
pH, UA: 6

## 2015-10-01 NOTE — Addendum Note (Signed)
Addended by: Valli Glance F on: 10/01/2015 11:41 AM   Modules accepted: Orders

## 2015-10-01 NOTE — Progress Notes (Signed)
Patient ID: Donnabell Pankiewicz, female   DOB: 12-26-1980, 35 y.o.   MRN: [id number] Subjective:    Azaylee Kijek is a 35 y.o. female being seen today for her obstetrical visit. She is at [redacted]w[redacted]d gestation. Patient reports no complaints. Fetal movement: normal.  Problem List Items Addressed This Visit    None    Visit Diagnoses   None.    Patient Active Problem List   Diagnosis Date Noted  . SGA (small for gestational age), fetal, affecting care of mother, antepartum 09/24/2015  . Previous cesarean delivery, antepartum 09/24/2015  . Previous preterm delivery x 2, antepartum 09/24/2015  . Obesity affecting pregnancy, antepartum 09/24/2015  . Gestational diabetes mellitus, antepartum 09/24/2015  . Supervision of high risk pregnancy in third trimester 09/14/2015   Objective:    BP (!) 116/52   Pulse (!) 109   Temp 97.8 F (36.6 C)   Wt 239 lb (108.4 kg)   LMP 02/04/2015   BMI 42.34 kg/m  FHT:  150 BPM  Uterine Size: size equals dates  Presentation: unsure     Assessment:    Pregnancy @ [redacted]w[redacted]d weeks   Plan:     labs reviewed, problem list updated Consent signed. GBS sent TDAP offered  Rhogam given for RH negative Pediatrician: discussed. Infant feeding: plans to breastfeed. Maternity leave: discussed. .No orders of the defined types were placed in this encounter.  No orders of the defined types were placed in this encounter.  Follow up in 1 Week.  Weekly NST's

## 2015-10-01 NOTE — Progress Notes (Signed)
Patient is concerned about her glucose numbers- she is not sure if she needs to be taking her metformin.

## 2015-10-01 NOTE — Addendum Note (Signed)
Addended by: Valli Glance F on: 10/01/2015 10:51 AM   Modules accepted: Orders

## 2015-10-04 ENCOUNTER — Encounter (HOSPITAL_COMMUNITY): Payer: Self-pay

## 2015-10-04 ENCOUNTER — Ambulatory Visit (HOSPITAL_COMMUNITY)
Admission: RE | Admit: 2015-10-04 | Discharge: 2015-10-04 | Disposition: A | Discharge status: Home / Self Care | Payer: Medicaid Other | Source: Ambulatory Visit | Attending: Obstetrics | Admitting: Obstetrics

## 2015-10-04 DIAGNOSIS — Z3A34 34 weeks gestation of pregnancy: Secondary | ICD-10-CM | POA: Insufficient documentation

## 2015-10-04 DIAGNOSIS — O34219 Maternal care for unspecified type scar from previous cesarean delivery: Secondary | ICD-10-CM | POA: Diagnosis not present

## 2015-10-04 DIAGNOSIS — O99213 Obesity complicating pregnancy, third trimester: Secondary | ICD-10-CM | POA: Insufficient documentation

## 2015-10-04 DIAGNOSIS — O24419 Gestational diabetes mellitus in pregnancy, unspecified control: Secondary | ICD-10-CM | POA: Diagnosis not present

## 2015-10-04 DIAGNOSIS — O09213 Supervision of pregnancy with history of pre-term labor, third trimester: Secondary | ICD-10-CM | POA: Diagnosis not present

## 2015-10-04 DIAGNOSIS — O99210 Obesity complicating pregnancy, unspecified trimester: Secondary | ICD-10-CM

## 2015-10-04 DIAGNOSIS — O365930 Maternal care for other known or suspected poor fetal growth, third trimester, not applicable or unspecified: Secondary | ICD-10-CM | POA: Diagnosis not present

## 2015-10-04 DIAGNOSIS — O365931 Maternal care for other known or suspected poor fetal growth, third trimester, fetus 1: Secondary | ICD-10-CM

## 2015-10-08 ENCOUNTER — Encounter: Payer: Self-pay | Admitting: Obstetrics

## 2015-10-08 ENCOUNTER — Ambulatory Visit (INDEPENDENT_AMBULATORY_CARE_PROVIDER_SITE_OTHER): Payer: Medicaid Other | Admitting: Obstetrics

## 2015-10-08 ENCOUNTER — Inpatient Hospital Stay (HOSPITAL_COMMUNITY)
Admission: AD | Admit: 2015-10-08 | Discharge: 2015-10-08 | Disposition: A | Discharge status: Home / Self Care | Payer: Medicaid Other | Source: Ambulatory Visit | Attending: Obstetrics and Gynecology | Admitting: Obstetrics and Gynecology

## 2015-10-08 ENCOUNTER — Inpatient Hospital Stay (HOSPITAL_COMMUNITY): Payer: Medicaid Other

## 2015-10-08 ENCOUNTER — Encounter (HOSPITAL_COMMUNITY): Payer: Self-pay | Admitting: *Deleted

## 2015-10-08 VITALS — BP 121/77 | HR 109 | Temp 98.2°F

## 2015-10-08 DIAGNOSIS — Z87891 Personal history of nicotine dependence: Secondary | ICD-10-CM | POA: Insufficient documentation

## 2015-10-08 DIAGNOSIS — Z880 Allergy status to penicillin: Secondary | ICD-10-CM | POA: Insufficient documentation

## 2015-10-08 DIAGNOSIS — Z885 Allergy status to narcotic agent status: Secondary | ICD-10-CM | POA: Insufficient documentation

## 2015-10-08 DIAGNOSIS — O24414 Gestational diabetes mellitus in pregnancy, insulin controlled: Secondary | ICD-10-CM | POA: Insufficient documentation

## 2015-10-08 DIAGNOSIS — O368130 Decreased fetal movements, third trimester, not applicable or unspecified: Secondary | ICD-10-CM | POA: Insufficient documentation

## 2015-10-08 DIAGNOSIS — O09893 Supervision of other high risk pregnancies, third trimester: Secondary | ICD-10-CM

## 2015-10-08 DIAGNOSIS — Z3A35 35 weeks gestation of pregnancy: Secondary | ICD-10-CM | POA: Diagnosis not present

## 2015-10-08 DIAGNOSIS — O288 Other abnormal findings on antenatal screening of mother: Secondary | ICD-10-CM

## 2015-10-08 DIAGNOSIS — O368131 Decreased fetal movements, third trimester, fetus 1: Secondary | ICD-10-CM

## 2015-10-08 DIAGNOSIS — Z3689 Encounter for other specified antenatal screening: Secondary | ICD-10-CM

## 2015-10-08 LAB — POCT URINALYSIS DIPSTICK
Bilirubin, UA: NEGATIVE
Blood, UA: NEGATIVE
GLUCOSE UA: NEGATIVE
LEUKOCYTES UA: NEGATIVE
Nitrite, UA: NEGATIVE
Spec Grav, UA: 1.02
UROBILINOGEN UA: NEGATIVE
pH, UA: 5

## 2015-10-08 NOTE — MAU Provider Note (Signed)
Chief Complaint:  Decreased Fetal Movement   None     HPI: Kristen [name] is a 35 y.o. [id number] at [redacted]w[redacted]d pt of [facility] who presents to maternity admissions sent from the office for nonreactive NST.  She reports decreased fetal movement x 3 days with movement only after eating or pushing on her abdomen.  Today, she ate before her NST but still was feeling little movement and reports Dr Jodi Mourning did not see accelerations on her tracing.  Since arrival in MAU, she is feeling lots of fetal movement.  She denies regular contractions, LOF, vaginal bleeding, vaginal itching/burning, urinary symptoms, h/a, dizziness, n/v, or fever/chills.    HPI  Past Medical History: Past Medical History:  Diagnosis Date  . Alpha thalassemia ([place])   . Asthma   . Complication of anesthesia   . Endometriosis   . Gestational diabetes   . Preterm labor with preterm delivery     Past obstetric history: OB History  Gravida Para Term Preterm AB Living  12 4 2 2 8 4   SAB TAB Ectopic Multiple Live Births  0 0 0 1 4    # Outcome Date GA Lbr Len/2nd Weight Sex Delivery Anes PTL Lv  12 Current           11 Term 05/07/10 [redacted]w[redacted]d  7 lb 5 oz (3.317 kg) F Vag-Spont None N LIV  10 AB 01/2009 [redacted]w[redacted]d         9 Term 08/05/08 [redacted]w[redacted]d  7 lb 6 oz (3.345 kg) M Vag-Spont None N LIV  8 AB 09/2007 [redacted]w[redacted]d         7 AB 03/2006 [redacted]w[redacted]d         6 AB 06/2005 [redacted]w[redacted]d         5 AB 02/2005 [redacted]w[redacted]d         4 Preterm 02/22/04 [redacted]w[redacted]d  3 lb 11 oz (1.673 kg) F CS-LTranv Spinal Y LIV  3 AB 07/2002 [redacted]w[redacted]d    SAB     2 AB 04/23/02 [redacted]w[redacted]d    SAB     1A Preterm 07/17/01 [redacted]w[redacted]d  1 lb 5 oz (0.595 kg) F Vag-Spont None Y LIV     Complications: Preterm contractions, second trimester  1B AB 04/2001 [redacted]w[redacted]d   M SAB         Past Surgical History: Past Surgical History:  Procedure Laterality Date  . CESAREAN SECTION    . DILATION AND CURETTAGE OF UTERUS      Family History: Family History  Problem Relation Age of Onset  . HIV Father     Social  History: Social History  Substance Use Topics  . Smoking status: Former Research scientist (life sciences)  . Smokeless tobacco: Never Used  . Alcohol use No     Comment: occ    Allergies:  Allergies  Allergen Reactions  . Amoxicillin Hives and Shortness Of Breath  . Doxycycline Hives and Shortness Of Breath  . Percocet [Oxycodone-Acetaminophen] Anaphylaxis  . Nerve Block Tray Itching and Other (See Comments)    burning  . Penicillins Hives    Has patient had a PCN reaction causing immediate rash, facial/tongue/throat swelling, SOB or lightheadedness with hypotension: Yes Has patient had a PCN reaction causing severe rash involving mucus membranes or skin necrosis: No Has patient had a PCN reaction that required hospitalization Yes Has patient had a PCN reaction occurring within the last 10 years: Yes If all of the above answers are "NO", then may proceed with Cephalosporin use.    Meds:  Facility-Administered Medications Prior to Admission  Medication Dose Route Frequency Provider Last Rate Last Dose  . hydroxyprogesterone caproate (MAKENA) 250 mg/mL injection 250 mg  250 mg Intramuscular Weekly Rachelle A Denney, CNM   250 mg at 10/08/15 1204   Prescriptions Prior to Admission  Medication Sig Dispense Refill Last Dose  . insulin NPH Human (NOVOLIN N) 100 UNIT/ML injection Inject 0.1 mLs (10 Units total) into the skin 2 (two) times daily before a meal. 10 mL 11 10/08/2015 at Unknown time  . metFORMIN (GLUCOPHAGE XR) 500 MG 24 hr tablet Take 2 tablets (1,000 mg total) by mouth 2 (two) times daily at 8 am and 10 pm. 120 tablet 11 10/08/2015 at Unknown time  . mometasone (NASONEX) 50 MCG/ACT nasal spray Place 2 sprays into the nose daily. (Patient taking differently: Place 2 sprays into the nose daily as needed (for sinus congestion/headache). ) 17 g 12 Past Month at Unknown time  . omeprazole (PRILOSEC) 20 MG capsule Take 1 capsule (20 mg total) by mouth 2 (two) times daily before a meal. 60 capsule 5 10/08/2015  at Unknown time  . Prenat-FeFmCb-DSS-FA-DHA w/o A (CITRANATAL HARMONY) 27-1-260 MG CAPS Take 1 capsule by mouth daily. (Patient taking differently: Take 1 capsule by mouth at bedtime. ) 30 capsule 11 10/07/2015 at Unknown time  . albuterol (PROVENTIL HFA;VENTOLIN HFA) 108 (90 BASE) MCG/ACT inhaler Inhale 2 puffs into the lungs every 6 (six) hours as needed for wheezing or shortness of breath. Reported on 05/02/2015   Rescue    ROS:  Review of Systems  Constitutional: Negative for chills, fatigue and fever.  Eyes: Negative for visual disturbance.  Respiratory: Negative for shortness of breath.   Cardiovascular: Negative for chest pain.  Gastrointestinal: Negative for abdominal pain, nausea and vomiting.  Genitourinary: Negative for difficulty urinating, dysuria, flank pain, pelvic pain, vaginal bleeding, vaginal discharge and vaginal pain.  Neurological: Negative for dizziness and headaches.  Psychiatric/Behavioral: Negative.      I have reviewed patient's Past Medical Hx, Surgical Hx, Family Hx, Social Hx, medications and allergies.   Physical Exam   Patient Vitals for the past 24 hrs:  BP Temp Pulse Resp  10/08/15 1332 134/80 98 F (36.7 C) 91 18   Constitutional: Well-developed, well-nourished female in no acute distress.  Cardiovascular: normal rate Respiratory: normal effort GI: Abd soft, non-tender, gravid appropriate for gestational age.  MS: Extremities nontender, no edema, normal ROM Neurologic: Alert and oriented x 4.  GU: Neg CVAT.   FHT:  Baseline 145, moderate variability, accelerations present (after 30+minutes of monitoring), no decelerations Contractions: occasional, mild to palpation   Labs: No results found for this or any previous visit (from the past 24 hour(s)). B/POS/-- (03/08 1450)  Imaging:  Preliminary report with 8/8 on MFM BPP  MAU Course/MDM: I have ordered labs and BPP and reviewed results.  NST was initially not reactive but became reactive  with 15x15 accels prior to ordered BPP. With BPP 8/8, score is 10/10 with reactive NS.  D/C home. Reviewed kick counts/reasons to return to MAU.  When discharging pt, pt expressed concerns that she is still only feeling movement when she pushes on her abdomen or moves around to encourage the baby to move.  She reports that during the BPP, the [place] tech had to push on her abdomen and noted that the baby was "sleepy" at times.  She is concerned about going home and not feeling movement x several hours and is unsure of when to return.  Consult Dr Mariane Masters, plan for pt to f/u with Dr Jodi Mourning again tomorrow.  Called office and appt made at 10:15 am.  Discussed BPP findings, decreased risk of stillbirth with 8/8.  Reviewed reasons to return to MAU with pt, encouraged her to return if she is concerned about fetal movement.  Pt stable at time of discharge.  Assessment: 1. NST (non-stress test) reactive   2. NST (non-stress test) nonreactive   3. Decreased fetal movement, third trimester, fetus 1     Plan: Discharge home Labor precautions and fetal kick counts Follow-up Information    HARPER,CHARLES A, MD .   Specialty:  Obstetrics and Gynecology Why:  As scheduled, return to MAU as needed for emergencies. Contact information: [address] [place] [address] [phone]            Medication List    TAKE these medications   albuterol 108 (90 Base) MCG/ACT inhaler Commonly known as:  PROVENTIL HFA;VENTOLIN HFA Inhale 2 puffs into the lungs every 6 (six) hours as needed for wheezing or shortness of breath. Reported on 05/02/2015   CITRANATAL HARMONY 27-1-260 MG Caps Take 1 capsule by mouth daily. What changed:  when to take this   insulin NPH Human 100 UNIT/ML injection Commonly known as:  NOVOLIN N Inject 0.1 mLs (10 Units total) into the skin 2 (two) times daily before a meal.   metFORMIN 500 MG 24 hr tablet Commonly known as:  GLUCOPHAGE XR Take 2 tablets (1,000 mg  total) by mouth 2 (two) times daily at 8 am and 10 pm.   mometasone 50 MCG/ACT nasal spray Commonly known as:  NASONEX Place 2 sprays into the nose daily. What changed:  when to take this  reasons to take this   omeprazole 20 MG capsule Commonly known as:  PRILOSEC Take 1 capsule (20 mg total) by mouth 2 (two) times daily before a meal.       Fatima Blank Certified Nurse-Midwife 10/08/2015 4:30 PM

## 2015-10-08 NOTE — MAU Note (Signed)
Pt presents to MAU from Dr Jacelyn Grip office for repeat NST. Pt was evaluated this morning and had a non reactive NST. Denies any vaginal bleeding or LOF

## 2015-10-08 NOTE — Progress Notes (Signed)
Patient ID: Kristen [name], female   DOB: 1980-11-15, 35 y.o.   MRN: [id number] Subjective:    Kristen [name] is a 35 y.o. female being seen today for her obstetrical visit. She is at [redacted]w[redacted]d gestation. Patient reports no complaints. Fetal movement: decreased.  Problem List Items Addressed This Visit    None    Visit Diagnoses   None.    Patient Active Problem List   Diagnosis Date Noted  . SGA (small for gestational age), fetal, affecting care of mother, antepartum 09/24/2015  . Previous cesarean delivery, antepartum 09/24/2015  . Previous preterm delivery x 2, antepartum 09/24/2015  . Obesity affecting pregnancy, antepartum 09/24/2015  . Gestational diabetes mellitus, antepartum 09/24/2015  . Supervision of high risk pregnancy in third trimester 09/14/2015   Objective:    BP 121/77   Pulse (!) 109   Temp 98.2 F (36.8 C)   LMP 02/04/2015  FHT:  150 BPM  Uterine Size: size equals dates  Presentation: unsure     Assessment:    Pregnancy @ [redacted]w[redacted]d weeks   Plan:     labs reviewed, problem list updated Consent signed. GBS sent TDAP offered  Rhogam given for RH negative Pediatrician: discussed. Infant feeding: plans to breastfeed. Maternity leave: discussed.  No orders of the defined types were placed in this encounter.  No orders of the defined types were placed in this encounter.  Follow up in 1 Week.

## 2015-10-08 NOTE — Discharge Instructions (Signed)
Fetal Movement Counts  Patient Name: __________________________________________________ Patient Due Date: ____________________  Performing a fetal movement count is highly recommended in high-risk pregnancies, but it is good for every pregnant woman to do. Your health care provider may ask you to start counting fetal movements at 28 weeks of the pregnancy. Fetal movements often increase:  · After eating a full meal.  · After physical activity.  · After eating or drinking something sweet or cold.  · At rest.  Pay attention to when you feel the baby is most active. This will help you notice a pattern of your baby's sleep and wake cycles and what factors contribute to an increase in fetal movement. It is important to perform a fetal movement count at the same time each day when your baby is normally most active.   HOW TO COUNT FETAL MOVEMENTS  1. Find a quiet and comfortable area to sit or lie down on your left side. Lying on your left side provides the best blood and oxygen circulation to your baby.  2. Write down the day and time on a sheet of paper or in a journal.  3. Start counting kicks, flutters, swishes, rolls, or jabs in a 2-hour period. You should feel at least 10 movements within 2 hours.  4. If you do not feel 10 movements in 2 hours, wait 2-3 hours and count again. Look for a change in the pattern or not enough counts in 2 hours.  SEEK MEDICAL CARE IF:  · You feel less than 10 counts in 2 hours, tried twice.  · There is no movement in over an hour.  · The pattern is changing or taking longer each day to reach 10 counts in 2 hours.  · You feel the baby is not moving as he or she usually does.  Date: ____________ Movements: ____________ Start time: ____________ Finish time: ____________   Date: ____________ Movements: ____________ Start time: ____________ Finish time: ____________  Date: ____________ Movements: ____________ Start time: ____________ Finish time: ____________  Date: ____________ Movements:  ____________ Start time: ____________ Finish time: ____________  Date: ____________ Movements: ____________ Start time: ____________ Finish time: ____________  Date: ____________ Movements: ____________ Start time: ____________ Finish time: ____________  Date: ____________ Movements: ____________ Start time: ____________ Finish time: ____________  Date: ____________ Movements: ____________ Start time: ____________ Finish time: ____________   Date: ____________ Movements: ____________ Start time: ____________ Finish time: ____________  Date: ____________ Movements: ____________ Start time: ____________ Finish time: ____________  Date: ____________ Movements: ____________ Start time: ____________ Finish time: ____________  Date: ____________ Movements: ____________ Start time: ____________ Finish time: ____________  Date: ____________ Movements: ____________ Start time: ____________ Finish time: ____________  Date: ____________ Movements: ____________ Start time: ____________ Finish time: ____________  Date: ____________ Movements: ____________ Start time: ____________ Finish time: ____________   Date: ____________ Movements: ____________ Start time: ____________ Finish time: ____________  Date: ____________ Movements: ____________ Start time: ____________ Finish time: ____________  Date: ____________ Movements: ____________ Start time: ____________ Finish time: ____________  Date: ____________ Movements: ____________ Start time: ____________ Finish time: ____________  Date: ____________ Movements: ____________ Start time: ____________ Finish time: ____________  Date: ____________ Movements: ____________ Start time: ____________ Finish time: ____________  Date: ____________ Movements: ____________ Start time: ____________ Finish time: ____________   Date: ____________ Movements: ____________ Start time: ____________ Finish time: ____________  Date: ____________ Movements: ____________ Start time: ____________ Finish  time: ____________  Date: ____________ Movements: ____________ Start time: ____________ Finish time: ____________  Date: ____________ Movements: ____________ Start time:   ____________ Finish time: ____________  Date: ____________ Movements: ____________ Start time: ____________ Finish time: ____________  Date: ____________ Movements: ____________ Start time: ____________ Finish time: ____________  Date: ____________ Movements: ____________ Start time: ____________ Finish time: ____________   Date: ____________ Movements: ____________ Start time: ____________ Finish time: ____________  Date: ____________ Movements: ____________ Start time: ____________ Finish time: ____________  Date: ____________ Movements: ____________ Start time: ____________ Finish time: ____________  Date: ____________ Movements: ____________ Start time: ____________ Finish time: ____________  Date: ____________ Movements: ____________ Start time: ____________ Finish time: ____________  Date: ____________ Movements: ____________ Start time: ____________ Finish time: ____________  Date: ____________ Movements: ____________ Start time: ____________ Finish time: ____________   Date: ____________ Movements: ____________ Start time: ____________ Finish time: ____________  Date: ____________ Movements: ____________ Start time: ____________ Finish time: ____________  Date: ____________ Movements: ____________ Start time: ____________ Finish time: ____________  Date: ____________ Movements: ____________ Start time: ____________ Finish time: ____________  Date: ____________ Movements: ____________ Start time: ____________ Finish time: ____________  Date: ____________ Movements: ____________ Start time: ____________ Finish time: ____________  Date: ____________ Movements: ____________ Start time: ____________ Finish time: ____________   Date: ____________ Movements: ____________ Start time: ____________ Finish time: ____________  Date: ____________  Movements: ____________ Start time: ____________ Finish time: ____________  Date: ____________ Movements: ____________ Start time: ____________ Finish time: ____________  Date: ____________ Movements: ____________ Start time: ____________ Finish time: ____________  Date: ____________ Movements: ____________ Start time: ____________ Finish time: ____________  Date: ____________ Movements: ____________ Start time: ____________ Finish time: ____________  Date: ____________ Movements: ____________ Start time: ____________ Finish time: ____________   Date: ____________ Movements: ____________ Start time: ____________ Finish time: ____________  Date: ____________ Movements: ____________ Start time: ____________ Finish time: ____________  Date: ____________ Movements: ____________ Start time: ____________ Finish time: ____________  Date: ____________ Movements: ____________ Start time: ____________ Finish time: ____________  Date: ____________ Movements: ____________ Start time: ____________ Finish time: ____________  Date: ____________ Movements: ____________ Start time: ____________ Finish time: ____________     This information is not intended to replace advice given to you by your health care provider. Make sure you discuss any questions you have with your health care provider.     Document Released: 03/12/2006 Document Revised: 03/03/2014 Document Reviewed: 12/08/2011  Elsevier Interactive Patient Education ©2016 [employer]

## 2015-10-08 NOTE — Addendum Note (Signed)
Addended by: Clarnce Flock E on: 10/08/2015 04:36 PM   Modules accepted: Orders

## 2015-10-08 NOTE — Progress Notes (Signed)
Patient here today for NST/ROB/17P. Patient tolerated here 17P injection well in Left Ventro Gluteal. Patient states that when she receives injection in other locations here Sciatica bothers her & causes back pain

## 2015-10-09 ENCOUNTER — Ambulatory Visit (INDEPENDENT_AMBULATORY_CARE_PROVIDER_SITE_OTHER): Payer: Medicaid Other | Admitting: Obstetrics

## 2015-10-09 ENCOUNTER — Encounter: Payer: Self-pay | Admitting: Obstetrics

## 2015-10-09 VITALS — BP 130/73 | HR 108

## 2015-10-09 DIAGNOSIS — O24913 Unspecified diabetes mellitus in pregnancy, third trimester: Secondary | ICD-10-CM

## 2015-10-09 DIAGNOSIS — O09213 Supervision of pregnancy with history of pre-term labor, third trimester: Secondary | ICD-10-CM

## 2015-10-09 DIAGNOSIS — O09893 Supervision of other high risk pregnancies, third trimester: Secondary | ICD-10-CM

## 2015-10-09 NOTE — Progress Notes (Signed)
Patient ID: Kristen [name], female   DOB: 11/16/1980, 35 y.o.   MRN: [id number] Subjective:    Kristen [name] is a 35 y.o. female being seen today for her obstetrical visit. She is at [redacted]w[redacted]d gestation. Patient reports no complaints. Fetal movement: normal.  Problem List Items Addressed This Visit    None    Visit Diagnoses   None.    Patient Active Problem List   Diagnosis Date Noted  . SGA (small for gestational age), fetal, affecting care of mother, antepartum 09/24/2015  . Previous cesarean delivery, antepartum 09/24/2015  . Previous preterm delivery x 2, antepartum 09/24/2015  . Obesity affecting pregnancy, antepartum 09/24/2015  . Gestational diabetes mellitus, antepartum 09/24/2015  . Supervision of high risk pregnancy in third trimester 09/14/2015   Objective:    BP 130/73   Pulse (!) 108   LMP 02/04/2015  FHT:  150 BPM  Uterine Size: size equals dates  Presentation: unsure     Assessment:    Pregnancy @ [redacted]w[redacted]d weeks   Plan:     labs reviewed, problem list updated Consent signed. GBS sent TDAP offered  Rhogam given for RH negative Pediatrician: discussed. Infant feeding: plans to breastfeed. Maternity leave: discussed. Cigarette smoking:  former smoker No orders of the defined types were placed in this encounter.  No orders of the defined types were placed in this encounter.  Follow up in 1 Week.

## 2015-10-11 ENCOUNTER — Encounter (HOSPITAL_COMMUNITY): Payer: Self-pay

## 2015-10-11 ENCOUNTER — Encounter: Payer: Medicaid Other | Attending: Obstetrics | Admitting: *Deleted

## 2015-10-11 ENCOUNTER — Other Ambulatory Visit (HOSPITAL_COMMUNITY): Payer: Medicaid Other

## 2015-10-11 ENCOUNTER — Ambulatory Visit (HOSPITAL_COMMUNITY)
Admission: RE | Admit: 2015-10-11 | Discharge: 2015-10-11 | Disposition: A | Discharge status: Home / Self Care | Payer: Medicaid Other | Source: Ambulatory Visit | Attending: Obstetrics | Admitting: Obstetrics

## 2015-10-11 ENCOUNTER — Other Ambulatory Visit (HOSPITAL_COMMUNITY): Payer: Self-pay | Admitting: Maternal and Fetal Medicine

## 2015-10-11 DIAGNOSIS — O34219 Maternal care for unspecified type scar from previous cesarean delivery: Secondary | ICD-10-CM | POA: Insufficient documentation

## 2015-10-11 DIAGNOSIS — Z3A35 35 weeks gestation of pregnancy: Secondary | ICD-10-CM

## 2015-10-11 DIAGNOSIS — O365930 Maternal care for other known or suspected poor fetal growth, third trimester, not applicable or unspecified: Secondary | ICD-10-CM | POA: Diagnosis not present

## 2015-10-11 DIAGNOSIS — O24419 Gestational diabetes mellitus in pregnancy, unspecified control: Secondary | ICD-10-CM | POA: Insufficient documentation

## 2015-10-11 DIAGNOSIS — Z713 Dietary counseling and surveillance: Secondary | ICD-10-CM | POA: Diagnosis present

## 2015-10-11 DIAGNOSIS — O24414 Gestational diabetes mellitus in pregnancy, insulin controlled: Secondary | ICD-10-CM

## 2015-10-11 DIAGNOSIS — O09213 Supervision of pregnancy with history of pre-term labor, third trimester: Secondary | ICD-10-CM

## 2015-10-11 DIAGNOSIS — O99210 Obesity complicating pregnancy, unspecified trimester: Secondary | ICD-10-CM

## 2015-10-11 DIAGNOSIS — O365931 Maternal care for other known or suspected poor fetal growth, third trimester, fetus 1: Secondary | ICD-10-CM

## 2015-10-11 NOTE — Progress Notes (Signed)
  Patient was seen on 10/11/15 for Gestational Diabetes self-managemen. The following learning objectives were met by the patient during this course:   States the definition of Gestational Diabetes compared to Type 2 Diabetes and Pre-Diabetes  States why dietary management is important in controlling blood glucose  Describes the effects each nutrient has on blood glucose levels  Demonstrates ability to create a balanced meal plan  Demonstrates carbohydrate counting   States when to check blood glucose levels  Demonstrates proper blood glucose monitoring techniques  States the effect of stress and exercise on blood glucose levels  Patient already has a blood glucose monitor and is testing pre breakfast and 2 hour post meals. She was on Metformin prior to pregnancy with diagnosis of Pre-diabetes She has been maintained on Metformin throughout the pregnancy until 2 weeks ago when her BG spiked to over 200 mg/dl. She then started on Novolin N @ 10 units before breakfast and before bedtime,which she is currently taking with good results. EDD is November 11, 2015  Patient instructed to monitor glucose levels: FBS: 60 - <90 2 hour: <120  *Patient received handouts:  Nutrition Diabetes and Pregnancy  Carbohydrate Counting List  Patient will be seen for follow-up as needed.

## 2015-10-15 ENCOUNTER — Ambulatory Visit (INDEPENDENT_AMBULATORY_CARE_PROVIDER_SITE_OTHER): Payer: Medicaid Other | Admitting: Obstetrics

## 2015-10-15 ENCOUNTER — Encounter: Payer: Self-pay | Admitting: Obstetrics

## 2015-10-15 VITALS — BP 126/83 | HR 102 | Wt 240.2 lb

## 2015-10-15 DIAGNOSIS — O24913 Unspecified diabetes mellitus in pregnancy, third trimester: Secondary | ICD-10-CM

## 2015-10-15 DIAGNOSIS — O09213 Supervision of pregnancy with history of pre-term labor, third trimester: Secondary | ICD-10-CM

## 2015-10-15 DIAGNOSIS — O34219 Maternal care for unspecified type scar from previous cesarean delivery: Secondary | ICD-10-CM

## 2015-10-15 DIAGNOSIS — O0973 Supervision of high risk pregnancy due to social problems, third trimester: Secondary | ICD-10-CM

## 2015-10-15 LAB — POCT URINALYSIS DIPSTICK
BILIRUBIN UA: NEGATIVE
Blood, UA: NEGATIVE
Glucose, UA: NEGATIVE
KETONES UA: NEGATIVE
LEUKOCYTES UA: NEGATIVE
Nitrite, UA: NEGATIVE
Spec Grav, UA: 1.015
Urobilinogen, UA: 0.2
pH, UA: 8

## 2015-10-15 MED ORDER — HYDROXYPROGESTERONE CAPROATE 250 MG/ML IM OIL
250.0000 mg | TOPICAL_OIL | Freq: Once | INTRAMUSCULAR | Status: AC
  Administered 2015-10-15: 250 mg via INTRAMUSCULAR

## 2015-10-15 NOTE — Progress Notes (Signed)
Subjective:    Kristen [name] is a 35 y.o. female being seen today for her obstetrical visit. She is at [redacted]w[redacted]d gestation. Patient reports no complaints. Fetal movement: normal.  Problem List Items Addressed This Visit    None    Visit Diagnoses    Supervision of high risk pregnancy due to social problems in third trimester    -  Primary   Relevant Orders   POCT Urinalysis Dipstick   Fetal nonstress test     Patient Active Problem List   Diagnosis Date Noted  . SGA (small for gestational age), fetal, affecting care of mother, antepartum 09/24/2015  . Previous cesarean delivery, antepartum 09/24/2015  . Previous preterm delivery x 2, antepartum 09/24/2015  . Obesity affecting pregnancy, antepartum 09/24/2015  . Gestational diabetes mellitus, antepartum 09/24/2015  . Supervision of high risk pregnancy in third trimester 09/14/2015   Objective:    BP 126/83   Pulse (!) 102   Wt 240 lb 3.2 oz (109 kg)   LMP 02/04/2015   BMI 42.55 kg/m  FHT:  150 BPM  Uterine Size: size equals dates  Presentation: unsure     Assessment:    Pregnancy @ [redacted]w[redacted]d weeks   Plan:     labs reviewed, problem list updated Consent signed. GBS sent TDAP offered  Rhogam given for RH negative Pediatrician: discussed. Infant feeding: plans to breastfeed. Maternity leave: discussed. Cigarette smoking: former smoker. Orders Placed This Encounter  Procedures  . POCT Urinalysis Dipstick  . Fetal nonstress test    Standing Status:   Future    Standing Expiration Date:   10/14/2016   Meds ordered this encounter  Medications  . hydroxyprogesterone caproate (MAKENA) 250 mg/mL injection 250 mg   Follow up in 1 Week.

## 2015-10-15 NOTE — Progress Notes (Signed)
Pt c/o vaginal pressure 

## 2015-10-15 NOTE — Addendum Note (Signed)
Addended by: Dorothyann Gibbs on: 10/15/2015 02:56 PM   Modules accepted: Orders

## 2015-10-17 ENCOUNTER — Other Ambulatory Visit (HOSPITAL_COMMUNITY): Payer: Self-pay | Admitting: *Deleted

## 2015-10-17 DIAGNOSIS — O24419 Gestational diabetes mellitus in pregnancy, unspecified control: Secondary | ICD-10-CM

## 2015-10-18 ENCOUNTER — Other Ambulatory Visit (HOSPITAL_COMMUNITY): Payer: Self-pay | Admitting: Maternal and Fetal Medicine

## 2015-10-18 ENCOUNTER — Ambulatory Visit (HOSPITAL_COMMUNITY)
Admission: RE | Admit: 2015-10-18 | Discharge: 2015-10-18 | Disposition: A | Discharge status: Home / Self Care | Payer: Medicaid Other | Source: Ambulatory Visit | Attending: Obstetrics | Admitting: Obstetrics

## 2015-10-18 ENCOUNTER — Encounter (HOSPITAL_COMMUNITY): Payer: Self-pay

## 2015-10-18 VITALS — BP 122/83 | HR 95 | Wt 241.1 lb

## 2015-10-18 DIAGNOSIS — E669 Obesity, unspecified: Secondary | ICD-10-CM | POA: Insufficient documentation

## 2015-10-18 DIAGNOSIS — Z3A36 36 weeks gestation of pregnancy: Secondary | ICD-10-CM

## 2015-10-18 DIAGNOSIS — O34219 Maternal care for unspecified type scar from previous cesarean delivery: Secondary | ICD-10-CM

## 2015-10-18 DIAGNOSIS — O09213 Supervision of pregnancy with history of pre-term labor, third trimester: Secondary | ICD-10-CM

## 2015-10-18 DIAGNOSIS — O99213 Obesity complicating pregnancy, third trimester: Secondary | ICD-10-CM | POA: Insufficient documentation

## 2015-10-18 DIAGNOSIS — O365930 Maternal care for other known or suspected poor fetal growth, third trimester, not applicable or unspecified: Secondary | ICD-10-CM

## 2015-10-18 DIAGNOSIS — O09893 Supervision of other high risk pregnancies, third trimester: Secondary | ICD-10-CM

## 2015-10-18 DIAGNOSIS — O24419 Gestational diabetes mellitus in pregnancy, unspecified control: Secondary | ICD-10-CM

## 2015-10-18 DIAGNOSIS — O99210 Obesity complicating pregnancy, unspecified trimester: Secondary | ICD-10-CM

## 2015-10-18 DIAGNOSIS — IMO0002 Reserved for concepts with insufficient information to code with codable children: Secondary | ICD-10-CM

## 2015-10-18 DIAGNOSIS — O365931 Maternal care for other known or suspected poor fetal growth, third trimester, fetus 1: Secondary | ICD-10-CM

## 2015-10-22 ENCOUNTER — Encounter: Payer: Self-pay | Admitting: Obstetrics

## 2015-10-22 ENCOUNTER — Ambulatory Visit (INDEPENDENT_AMBULATORY_CARE_PROVIDER_SITE_OTHER): Payer: Medicaid Other | Admitting: Obstetrics

## 2015-10-22 VITALS — BP 134/83 | HR 106 | Temp 98.2°F | Wt 240.8 lb

## 2015-10-22 DIAGNOSIS — O0973 Supervision of high risk pregnancy due to social problems, third trimester: Secondary | ICD-10-CM | POA: Diagnosis not present

## 2015-10-22 DIAGNOSIS — O09213 Supervision of pregnancy with history of pre-term labor, third trimester: Secondary | ICD-10-CM

## 2015-10-22 DIAGNOSIS — Z3493 Encounter for supervision of normal pregnancy, unspecified, third trimester: Secondary | ICD-10-CM | POA: Diagnosis not present

## 2015-10-22 DIAGNOSIS — O365931 Maternal care for other known or suspected poor fetal growth, third trimester, fetus 1: Secondary | ICD-10-CM

## 2015-10-22 DIAGNOSIS — O24913 Unspecified diabetes mellitus in pregnancy, third trimester: Secondary | ICD-10-CM

## 2015-10-22 LAB — POCT URINALYSIS DIPSTICK
BILIRUBIN UA: NEGATIVE
Blood, UA: NEGATIVE
Glucose, UA: NEGATIVE
KETONES UA: NEGATIVE
LEUKOCYTES UA: NEGATIVE
Nitrite, UA: NEGATIVE
PH UA: 6
SPEC GRAV UA: 1.015
Urobilinogen, UA: NEGATIVE

## 2015-10-22 NOTE — Progress Notes (Signed)
Patient has no complaints

## 2015-10-22 NOTE — Progress Notes (Signed)
Patient ID: [name], female   DOB: 03-28-1980, 35 y.o.   MRN: [id number] Subjective:    Kristen [name] is a 35 y.o. female being seen today for her obstetrical visit. She is at [redacted]w[redacted]d gestation. Patient reports no complaints. Fetal movement: normal.  Problem List Items Addressed This Visit    None    Visit Diagnoses    Prenatal care, third trimester    -  Primary   Relevant Orders   POCT urinalysis dipstick     Patient Active Problem List   Diagnosis Date Noted  . SGA (small for gestational age), fetal, affecting care of mother, antepartum 09/24/2015  . Previous cesarean delivery, antepartum 09/24/2015  . Previous preterm delivery x 2, antepartum 09/24/2015  . Obesity affecting pregnancy, antepartum 09/24/2015  . Gestational diabetes mellitus, antepartum 09/24/2015  . Supervision of high risk pregnancy in third trimester 09/14/2015    Objective:    BP 134/83   Pulse (!) 106   Temp 98.2 F (36.8 C)   Wt 240 lb 12.8 oz (109.2 kg)   LMP 02/04/2015   BMI 42.66 kg/m  FHT: 150 BPM  Uterine Size: size equals dates  Presentations: cephalic   NST:  Reactive  Assessment:    Pregnancy @ [redacted]w[redacted]d weeks   Plan:   Plans for delivery: Vaginal anticipated; labs reviewed; problem list updated Counseling: Consent signed. Infant feeding: plans to breastfeed. Cigarette smoking: former smoker. L&D discussion: symptoms of labor, discussed when to call, discussed what number to call, anesthetic/analgesic options reviewed and delivering clinician:  plans no preference. Postpartum supports and preparation: circumcision discussed and contraception plans discussed.  Follow up in 1 Week.

## 2015-10-22 NOTE — Progress Notes (Signed)
Patient is ready to deliver- wants to move her induction

## 2015-10-23 ENCOUNTER — Telehealth (HOSPITAL_COMMUNITY): Payer: Self-pay | Admitting: *Deleted

## 2015-10-23 ENCOUNTER — Encounter (HOSPITAL_COMMUNITY): Payer: Self-pay | Admitting: *Deleted

## 2015-10-23 NOTE — Telephone Encounter (Signed)
Preadmission screen  

## 2015-10-25 ENCOUNTER — Other Ambulatory Visit (HOSPITAL_COMMUNITY): Payer: Medicaid Other

## 2015-10-25 ENCOUNTER — Other Ambulatory Visit (HOSPITAL_COMMUNITY): Payer: Self-pay | Admitting: Maternal and Fetal Medicine

## 2015-10-25 ENCOUNTER — Encounter (HOSPITAL_COMMUNITY): Payer: Self-pay

## 2015-10-25 ENCOUNTER — Ambulatory Visit (HOSPITAL_COMMUNITY)
Admission: RE | Admit: 2015-10-25 | Discharge: 2015-10-25 | Disposition: A | Discharge status: Home / Self Care | Payer: Medicaid Other | Source: Ambulatory Visit | Attending: Obstetrics | Admitting: Obstetrics

## 2015-10-25 DIAGNOSIS — O24419 Gestational diabetes mellitus in pregnancy, unspecified control: Secondary | ICD-10-CM | POA: Diagnosis not present

## 2015-10-25 DIAGNOSIS — O365930 Maternal care for other known or suspected poor fetal growth, third trimester, not applicable or unspecified: Secondary | ICD-10-CM | POA: Diagnosis not present

## 2015-10-25 DIAGNOSIS — IMO0002 Reserved for concepts with insufficient information to code with codable children: Secondary | ICD-10-CM

## 2015-10-25 DIAGNOSIS — Z3A37 37 weeks gestation of pregnancy: Secondary | ICD-10-CM

## 2015-10-25 DIAGNOSIS — O34219 Maternal care for unspecified type scar from previous cesarean delivery: Secondary | ICD-10-CM | POA: Insufficient documentation

## 2015-10-25 DIAGNOSIS — O09293 Supervision of pregnancy with other poor reproductive or obstetric history, third trimester: Secondary | ICD-10-CM | POA: Diagnosis not present

## 2015-10-25 DIAGNOSIS — O99213 Obesity complicating pregnancy, third trimester: Secondary | ICD-10-CM

## 2015-10-25 DIAGNOSIS — E669 Obesity, unspecified: Secondary | ICD-10-CM | POA: Insufficient documentation

## 2015-10-28 ENCOUNTER — Encounter (HOSPITAL_COMMUNITY): Payer: Self-pay | Admitting: Anesthesiology

## 2015-10-28 ENCOUNTER — Inpatient Hospital Stay (HOSPITAL_COMMUNITY)
Admission: AD | Admit: 2015-10-28 | Discharge: 2015-11-01 | DRG: 765 | Disposition: A | Discharge status: Home / Self Care | Payer: Medicaid Other | Source: Ambulatory Visit | Attending: Obstetrics & Gynecology | Admitting: Obstetrics & Gynecology

## 2015-10-28 ENCOUNTER — Encounter (HOSPITAL_COMMUNITY): Payer: Self-pay

## 2015-10-28 ENCOUNTER — Inpatient Hospital Stay (HOSPITAL_COMMUNITY): Admission: RE | Admit: 2015-10-28 | Payer: Medicaid Other | Source: Ambulatory Visit

## 2015-10-28 DIAGNOSIS — Z23 Encounter for immunization: Secondary | ICD-10-CM | POA: Diagnosis not present

## 2015-10-28 DIAGNOSIS — Z87891 Personal history of nicotine dependence: Secondary | ICD-10-CM | POA: Diagnosis not present

## 2015-10-28 DIAGNOSIS — Z7984 Long term (current) use of oral hypoglycemic drugs: Secondary | ICD-10-CM | POA: Diagnosis not present

## 2015-10-28 DIAGNOSIS — O9902 Anemia complicating childbirth: Secondary | ICD-10-CM | POA: Diagnosis present

## 2015-10-28 DIAGNOSIS — Z3490 Encounter for supervision of normal pregnancy, unspecified, unspecified trimester: Secondary | ICD-10-CM

## 2015-10-28 DIAGNOSIS — J45909 Unspecified asthma, uncomplicated: Secondary | ICD-10-CM | POA: Diagnosis present

## 2015-10-28 DIAGNOSIS — O09219 Supervision of pregnancy with history of pre-term labor, unspecified trimester: Secondary | ICD-10-CM

## 2015-10-28 DIAGNOSIS — Z6841 Body Mass Index (BMI) 40.0 and over, adult: Secondary | ICD-10-CM | POA: Diagnosis not present

## 2015-10-28 DIAGNOSIS — O24424 Gestational diabetes mellitus in childbirth, insulin controlled: Secondary | ICD-10-CM | POA: Diagnosis present

## 2015-10-28 DIAGNOSIS — O99214 Obesity complicating childbirth: Secondary | ICD-10-CM | POA: Diagnosis present

## 2015-10-28 DIAGNOSIS — O34211 Maternal care for low transverse scar from previous cesarean delivery: Secondary | ICD-10-CM | POA: Diagnosis present

## 2015-10-28 DIAGNOSIS — Z3A38 38 weeks gestation of pregnancy: Secondary | ICD-10-CM | POA: Diagnosis not present

## 2015-10-28 DIAGNOSIS — O0993 Supervision of high risk pregnancy, unspecified, third trimester: Secondary | ICD-10-CM

## 2015-10-28 DIAGNOSIS — O24419 Gestational diabetes mellitus in pregnancy, unspecified control: Secondary | ICD-10-CM | POA: Diagnosis present

## 2015-10-28 DIAGNOSIS — O99210 Obesity complicating pregnancy, unspecified trimester: Secondary | ICD-10-CM | POA: Diagnosis present

## 2015-10-28 DIAGNOSIS — O9952 Diseases of the respiratory system complicating childbirth: Secondary | ICD-10-CM | POA: Diagnosis present

## 2015-10-28 DIAGNOSIS — O365990 Maternal care for other known or suspected poor fetal growth, unspecified trimester, not applicable or unspecified: Secondary | ICD-10-CM | POA: Diagnosis present

## 2015-10-28 DIAGNOSIS — O365930 Maternal care for other known or suspected poor fetal growth, third trimester, not applicable or unspecified: Secondary | ICD-10-CM | POA: Diagnosis present

## 2015-10-28 DIAGNOSIS — D560 Alpha thalassemia: Secondary | ICD-10-CM | POA: Diagnosis present

## 2015-10-28 DIAGNOSIS — O34219 Maternal care for unspecified type scar from previous cesarean delivery: Secondary | ICD-10-CM | POA: Diagnosis present

## 2015-10-28 DIAGNOSIS — O365931 Maternal care for other known or suspected poor fetal growth, third trimester, fetus 1: Secondary | ICD-10-CM

## 2015-10-28 LAB — OB RESULTS CONSOLE GBS: GBS: NEGATIVE

## 2015-10-28 LAB — CBC
HEMATOCRIT: 31.1 % — AB (ref 36.0–46.0)
HEMOGLOBIN: 10.2 g/dL — AB (ref 12.0–15.0)
MCH: 21.6 pg — AB (ref 26.0–34.0)
MCHC: 32.8 g/dL (ref 30.0–36.0)
MCV: 65.8 fL — ABNORMAL LOW (ref 78.0–100.0)
Platelets: 219 10*3/uL (ref 150–400)
RBC: 4.73 MIL/uL (ref 3.87–5.11)
RDW: 16.9 % — ABNORMAL HIGH (ref 11.5–15.5)
WBC: 14.3 10*3/uL — ABNORMAL HIGH (ref 4.0–10.5)

## 2015-10-28 LAB — ABO/RH: ABO/RH(D): B POS

## 2015-10-28 LAB — GLUCOSE, CAPILLARY
GLUCOSE-CAPILLARY: 97 mg/dL (ref 65–99)
GLUCOSE-CAPILLARY: 75 mg/dL (ref 65–99)
GLUCOSE-CAPILLARY: 105 mg/dL — AB (ref 65–99)
Glucose-Capillary: 86 mg/dL (ref 65–99)
Glucose-Capillary: 144 mg/dL — ABNORMAL HIGH (ref 65–99)

## 2015-10-28 LAB — TYPE AND SCREEN
ABO/RH(D): B POS
Antibody Screen: NEGATIVE

## 2015-10-28 LAB — GROUP B STREP BY PCR: GROUP B STREP BY PCR: NEGATIVE

## 2015-10-28 MED ORDER — LACTATED RINGERS IV SOLN: 500.0000 mL | INTRAVENOUS | Status: DC | PRN

## 2015-10-28 MED ORDER — LACTATED RINGERS IV SOLN
INTRAVENOUS | Status: DC
  Administered 2015-10-28 – 2015-10-29 (×7): via INTRAVENOUS

## 2015-10-28 MED ORDER — OXYTOCIN BOLUS FROM INFUSION: 500.0000 mL | Freq: Once | INTRAVENOUS | Status: DC

## 2015-10-28 MED ORDER — OXYTOCIN 40 UNITS IN LACTATED RINGERS INFUSION - SIMPLE MED: 2.5000 [IU]/h | INTRAVENOUS | Status: DC

## 2015-10-28 MED ORDER — SOD CITRATE-CITRIC ACID 500-334 MG/5ML PO SOLN
30.0000 mL | ORAL | Status: DC | PRN
  Administered 2015-10-29: 30 mL via ORAL
  Filled 2015-10-28: qty 15

## 2015-10-28 MED ORDER — ONDANSETRON HCL 4 MG/2ML IJ SOLN: 4.0000 mg | Freq: Four times a day (QID) | INTRAMUSCULAR | Status: DC | PRN

## 2015-10-28 MED ORDER — FLEET ENEMA 7-19 GM/118ML RE ENEM: 1.0000 | ENEMA | RECTAL | Status: DC | PRN

## 2015-10-28 NOTE — Progress Notes (Signed)
Double electric breast pump at pt bedside for nipple stimulation.  CBG was 144 after pt ate 20 min ago, MD notified.  New orders given to recheck CBG in 2 hours.  Will continue to monitor.  Fredrich Romans, RN BSN 8:59 PM 10/28/2015

## 2015-10-28 NOTE — H&P (Signed)
Kristen [name] is a 35 y.o. female [phone] @ 38.0 wks  presenting for IOL for IUGR, and GDM. Denies any ROM or vag bleeding. GBS unk. OB History    Gravida Para Term Preterm AB Living   12 4 2 2 8 4    SAB TAB Ectopic Multiple Live Births   0 0 0 1 4     Past Medical History:  Diagnosis Date  . Alpha thalassemia ([place])   . Asthma    rescue inhaler- last use May 2017  . Complication of anesthesia   . Endometriosis   . Gestational diabetes    metformin and insulin  . Preterm labor with preterm delivery    Past Surgical History:  Procedure Laterality Date  . CESAREAN SECTION    . DILATION AND CURETTAGE OF UTERUS     Family History: family history includes Diabetes in her maternal grandmother and paternal grandmother; HIV in her father. Social History:  reports that she has quit smoking. She has never used smokeless tobacco. She reports that she does not drink alcohol or use drugs.     Maternal Diabetes: Yes:  Diabetes Type:  Insulin/Medication controlled Genetic Screening: Normal Maternal Ultrasounds/Referrals: Abnormal:  Findings:   IUGR Fetal Ultrasounds or other Referrals:  Referred to Materal Fetal Medicine  Maternal Substance Abuse:  No Significant Maternal Medications:  None Significant Maternal Lab Results:  None Other Comments:  None  Review of Systems  Constitutional: Negative.   HENT: Negative.   Eyes: Negative.   Respiratory: Negative.   Cardiovascular: Negative.   Gastrointestinal: Negative.   Genitourinary: Negative.   Musculoskeletal: Negative.   Skin: Negative.   Neurological: Negative.   Endo/Heme/Allergies: Negative.   Psychiatric/Behavioral: Negative.    Maternal Medical History:  Reason for admission: IOL for IUGR, GDM  Contractions: Onset was 6-12 hours ago.   Frequency: irregular.   Perceived severity is mild.    Fetal activity: Perceived fetal activity is normal.   Last perceived fetal movement was within the past hour.    Prenatal  complications: IUGR.   Prenatal Complications - Diabetes: gestational. Diabetes is managed by insulin injections and oral agent (monotherapy).        Blood pressure 119/70, pulse (!) 104, temperature 98.2 F (36.8 C), temperature source Oral, resp. rate 18, height 5' 3.5" (1.613 m), weight 108.4 kg (239 lb), last menstrual period 02/04/2015. Maternal Exam:  Uterine Assessment: Contraction strength is mild.  Contraction frequency is rare.   Abdomen: Patient reports no abdominal tenderness. Surgical scars: low transverse.   Estimated fetal weight is 5-0.   Fetal presentation: vertex  Introitus: Normal vulva. Normal vagina.  Ferning test: not done.  Nitrazine test: not done. Amniotic fluid character: not assessed.  Pelvis: adequate for delivery.   Cervix: Cervix evaluated by digital exam.     Fetal Exam Fetal Monitor Review: Mode: ultrasound.   Baseline rate: 140's.  Variability: moderate (6-25 bpm).   Pattern: accelerations present and no decelerations.    Fetal State Assessment: Category I - tracings are normal.     Physical Exam  Constitutional: She is oriented to person, place, and time. She appears well-developed and well-nourished.  HENT:  Head: Normocephalic.  Eyes: Pupils are equal, round, and reactive to light.  Neck: Normal range of motion.  Cardiovascular: Normal rate and regular rhythm.   Respiratory: Effort normal and breath sounds normal.  GI: Soft. Bowel sounds are normal.  Genitourinary: Vagina normal and uterus normal.  Musculoskeletal: Normal range of motion.  Neurological:  She is alert and oriented to person, place, and time. She has normal reflexes.  Skin: Skin is warm and dry.  Psychiatric: She has a normal mood and affect. Her behavior is normal. Judgment and thought content normal.    Prenatal labs: ABO, Rh: B/POS/-- (03/08 1450) Antibody: NEG (03/08 1450) Rubella: 2.29 (03/08 1450) RPR: NON REAC (03/08 1450)  HBsAg: NEGATIVE (03/08 1450)   HIV: NONREACTIVE (03/08 1450)  GBS:     Assessment/Plan: Pregn. @ 38.0 wks, [id number], presenting for IOL due to IUGR and GDM, on insulin and oral meds. SVE 3/70/-2.   Pt desires VBAC. Pt declines any oxytocin at this time. GBS unk, will obtain rapid GBS, may AROM once results received.    Oletha Blend 10/28/2015, 9:39 AM

## 2015-10-28 NOTE — Anesthesia Pain Management Evaluation Note (Signed)
  CRNA Pain Management Visit Note  Patient: Kristen [name], 35 y.o., female  "Hello I am a member of the anesthesia team at [facility]. We have an anesthesia team available at all times to provide care throughout the hospital, including epidural management and anesthesia for C-section. I don't know your plan for the delivery whether it a natural birth, water birth, IV sedation, nitrous supplementation, doula or epidural, but we want to meet your pain goals."   1.Was your pain managed to your expectations on prior hospitalizations?   Yes   2.What is your expectation for pain management during this hospitalization?     IV pain meds and Nitrous Oxide  3.How can we help you reach that goal? Patient plans IV pain meds and Nitrous for delivery   Record the patient's initial score and the patient's pain goal.   Pain: 0  Pain Goal: 10 [facility] wants you to be able to say your pain was always managed very well.  Rayvon Char 10/28/2015

## 2015-10-28 NOTE — Progress Notes (Signed)
Kristen [name] is a 35 y.o. [id number] at [redacted]w[redacted]d by ultrasound admitted for induction of labor due to SGA, GDM and h/o IUGR in a prev gestation  Subjective: Pt reports that she is in labor and 'knows this because with her prior pregnancies she did not have extreme pain but, was actually in labor'.   She requests an IUPC.  She reports that she will not have regional anesthesia because of a reaction in a prior pregnancy.  Pt requested a repeat c-section but, reports that she has had [place] fries in her room.    Objective: BP 121/78   Pulse (!) 108   Temp 98.5 F (36.9 C) (Oral)   Resp 16   Ht 5' 3.5" (1.613 m)   Wt 239 lb (108.4 kg)   LMP 02/04/2015   BMI 41.67 kg/m  No intake/output data recorded. No intake/output data recorded.  FHT:  FHR: 150's bpm, variability: moderate,  accelerations:  Present,  decelerations:  Present with nipple stimulation UC:  Irregular on monitor SVE:   Dilation: 3 Effacement (%): 60 Exam by:: Dr Real Cons  Labs: Lab Results  Component Value Date   WBC 14.3 (H) 10/28/2015   HGB 10.2 (L) 10/28/2015   HCT 31.1 (L) 10/28/2015   MCV 65.8 (L) 10/28/2015   PLT 219 10/28/2015    Assessment / Plan: IOL with ROM- pt declines Pitocin and had variable deceleration with nipple stimulation.  She now wants to proceed with a repeat c-section under general anesthesia but, ate [place] recently.    I have consulted with anesthesia and the two of [place] with pts nurse have explained to the patient that the risks associated with general anesthesia on a full stomach is not indicated at this time as this is not an emergency.  The FHR is Cat I.  I have also explained to pt, her husband and another individual that an IUPC would further increased the risk of infection with no added benefit.  Pt reports that she is in labor even though we cannot tell. I have explained tot her that if she is in labor and the baby tolerates it we will proceed and allow her to deliver  vaginally.  The IUPC will not change our management. I have also explained to her that if at any time the fetus or mother are in jeopardy we will proceed with operative delviery at that time.  All of her questions were answered.   Labor: pt not in active labor Preeclampsia:  n/a Fetal Wellbeing:  Category I Pain Control:  Labor support without medications I/D:  n/a Anticipated MOD:  c-section in the am after 8 hours of NPO status.  Kristen [name], Kristen [name] 10/28/2015, 10:19 PM

## 2015-10-28 NOTE — Progress Notes (Signed)
Hand pump taken to bedside for nipple stimulation

## 2015-10-28 NOTE — Progress Notes (Signed)
Patient ID: Kristen [name], female   DOB: [birth year], 35 y.o.   MRN: [id number] Pt was admitted for IOL due to SGA and a history of an IUGR fetus. MFM recommended del at 90 weeks. Pt has a h/p prev c-section but, requested TOLAC and signed consent 09/24/2015.  The patient has had 2 prior SVDs.  Pt reports today that she refuses pitocon.  She reports that it caused 'pain in her c-section scar' and made her made her 'bleed post delivery for 6 weeks' I explained to her that any labor could affect her c-section scar and put her at risk for uterine rupture.  I also explained that it is unlikely that the bleeding was caused by the pitocin.  Pt reports that she 'wants what's best for her and [name] and if she is not comfortable than he will not do well'  She reports that she went into labor spontaneously after being ruptured prev.  She also reports that she 'stalled and needed pitocin in each of her pregnancies'.   I asked her how she planned to get into labor if she was declining pitocin and cesarean section. Pt initially requested Cytotec and I explained to her that cytotec would not be given as she has had a prev uterine incision.   She reports that she was told that she could get cytotec and I explained the risks and informed her that we would not prescribe cytotec or administer it to her this pregnancy. Pt requested rupture of membranes and expectant management.  She reports that she would prefer a cesarean section to pitocin. I have agreed to rupture her and do expectant management and if she does not labor spontaneously we would proceed to repeat cesarean section.  Cervix: 3cm/ post/-3 vertex well applied AROM  Clear and of minimal amount.FHR: Cat I   Pts nurse ws present for the entire discussion and exam  Pearlena Ow L. Harraway-Smith, M.D., Cherlynn June

## 2015-10-28 NOTE — Progress Notes (Signed)
Bedside nurse earlier in the day asked me to come speak with the patient because she had a history of a complication with anesthesia. I spoke with the patient and she reports that with her last C/S at [facility] she had an epidural which was used for her C/S but afterwards had 5+ days of total body itching, pain, fever, and back pain. She was told by those physicians to not have another epidural and with her subsequent pregnancies she delivered without neuraxial anesthesia. I instructed staff to get medical records from [facility] and they sent over the operative report and discharge summaries which did not indicate any problems. I called [facility] personally and spoke with several people in medical records who searched her chart and informed me that there was no anesthetic record but it could possibly be in storage and they will send a request for those records but given the holiday weekend that it would be highly unlikely to be able to get those records. I informed the patient of this and since she was told that she should not have another epidural placed given her "reaction" at the last C/S she is requesting a general anesthetic if she has a C/S. I informed her of the risks/benefits/options of anesthesia for C/S and despite the risks of general anesthesia and expressing an understanding of these risks, she wants to proceed with a general anesthetic and refuses neuraxial anesthesia. Dr. Ihor Dow had a long and detailed conversation with the patient and given that this C/S would be elective since the baby or mother is not in distress and she just ate french fries, the case will be put on the morning schedule as an elective CS when she is NPO. The patient appeared to be very agreeable to this plan. All anesthetic questions were addressed and answered.

## 2015-10-28 NOTE — Progress Notes (Signed)
Kristen [name] is a 35 y.o. [id number] at [redacted]w[redacted]d by ultrasound admitted for induction of labor due to Gestational diabetes, and IUGR.  Subjective:   Objective: BP 119/70   Pulse (!) 104   Temp 97.9 F (36.6 C) (Oral)   Resp 18   Ht 5' 3.5" (1.613 m)   Wt 239 lb (108.4 kg)   LMP 02/04/2015   BMI 41.67 kg/m  No intake/output data recorded. No intake/output data recorded.  FHT:  FHR: 140's bpm, variability: moderate,  accelerations:  Present,  decelerations:  Absent UC:   irregular, every 2-5 minutes SVE:   Dilation: 3 Effacement (%): 70 Exam by:: D Tommey Barret,CNM   Labs: Lab Results  Component Value Date   WBC 14.3 (H) 10/28/2015   HGB 10.2 (L) 10/28/2015   HCT 31.1 (L) 10/28/2015   MCV 65.8 (L) 10/28/2015   PLT 219 10/28/2015    Assessment / Plan: Will AROM once GBS has resulted   Labor: not yet in labor  Preeclampsia:  no signs or symptoms of toxicity, intake and ouput balanced and labs stable Fetal Wellbeing:  Category I Pain Control:  Labor support without medications I/D:  n/a Anticipated MOD:  NSVD  Oletha Blend 10/28/2015, 12:28 PM

## 2015-10-28 NOTE — Progress Notes (Signed)
Margarette Asal, RN assumed care of patient

## 2015-10-29 ENCOUNTER — Inpatient Hospital Stay (HOSPITAL_COMMUNITY): Payer: Medicaid Other | Admitting: Anesthesiology

## 2015-10-29 ENCOUNTER — Encounter (HOSPITAL_COMMUNITY)
Admission: AD | Disposition: A | Discharge status: Home / Self Care | Payer: Self-pay | Source: Ambulatory Visit | Attending: Obstetrics & Gynecology

## 2015-10-29 ENCOUNTER — Encounter (HOSPITAL_COMMUNITY): Payer: Self-pay

## 2015-10-29 DIAGNOSIS — O9902 Anemia complicating childbirth: Secondary | ICD-10-CM

## 2015-10-29 DIAGNOSIS — O24424 Gestational diabetes mellitus in childbirth, insulin controlled: Secondary | ICD-10-CM

## 2015-10-29 DIAGNOSIS — Z7984 Long term (current) use of oral hypoglycemic drugs: Secondary | ICD-10-CM

## 2015-10-29 DIAGNOSIS — O99214 Obesity complicating childbirth: Secondary | ICD-10-CM

## 2015-10-29 DIAGNOSIS — O9952 Diseases of the respiratory system complicating childbirth: Secondary | ICD-10-CM

## 2015-10-29 DIAGNOSIS — Z3A38 38 weeks gestation of pregnancy: Secondary | ICD-10-CM

## 2015-10-29 DIAGNOSIS — D560 Alpha thalassemia: Secondary | ICD-10-CM

## 2015-10-29 DIAGNOSIS — Z87891 Personal history of nicotine dependence: Secondary | ICD-10-CM

## 2015-10-29 DIAGNOSIS — O365930 Maternal care for other known or suspected poor fetal growth, third trimester, not applicable or unspecified: Secondary | ICD-10-CM

## 2015-10-29 LAB — GLUCOSE, CAPILLARY
GLUCOSE-CAPILLARY: 83 mg/dL (ref 65–99)
GLUCOSE-CAPILLARY: 93 mg/dL (ref 65–99)
GLUCOSE-CAPILLARY: 79 mg/dL (ref 65–99)
GLUCOSE-CAPILLARY: 75 mg/dL (ref 65–99)
GLUCOSE-CAPILLARY: 139 mg/dL — AB (ref 65–99)
Glucose-Capillary: 85 mg/dL (ref 65–99)

## 2015-10-29 LAB — RPR: RPR Ser Ql: NONREACTIVE

## 2015-10-29 SURGERY — Surgical Case
Anesthesia: Spinal

## 2015-10-29 MED ORDER — SODIUM CHLORIDE 0.9% FLUSH: 9.0000 mL | INTRAVENOUS | Status: DC | PRN

## 2015-10-29 MED ORDER — SENNOSIDES-DOCUSATE SODIUM 8.6-50 MG PO TABS
2.0000 | ORAL_TABLET | ORAL | Status: DC
  Administered 2015-10-30 – 2015-10-31 (×3): 2 via ORAL
  Filled 2015-10-29 (×3): qty 2

## 2015-10-29 MED ORDER — PROMETHAZINE HCL 25 MG/ML IJ SOLN: 6.2500 mg | INTRAMUSCULAR | Status: DC | PRN

## 2015-10-29 MED ORDER — LACTATED RINGERS IV SOLN
INTRAVENOUS | Status: DC
  Administered 2015-10-29 – 2015-10-30 (×2): via INTRAVENOUS

## 2015-10-29 MED ORDER — LACTATED RINGERS IV SOLN
INTRAVENOUS | Status: DC | PRN
  Administered 2015-10-29: 11:00:00 via INTRAVENOUS

## 2015-10-29 MED ORDER — ACETAMINOPHEN 500 MG PO TABS
ORAL_TABLET | ORAL | Status: AC
  Filled 2015-10-29: qty 2

## 2015-10-29 MED ORDER — KETOROLAC TROMETHAMINE 30 MG/ML IJ SOLN
30.0000 mg | Freq: Once | INTRAMUSCULAR | Status: AC
  Administered 2015-10-29: 30 mg via INTRAVENOUS

## 2015-10-29 MED ORDER — BUPIVACAINE IN DEXTROSE 0.75-8.25 % IT SOLN
INTRATHECAL | Status: DC | PRN
  Administered 2015-10-29: 12.75 mg via INTRATHECAL

## 2015-10-29 MED ORDER — FENTANYL CITRATE (PF) 100 MCG/2ML IJ SOLN
INTRAMUSCULAR | Status: DC | PRN
  Administered 2015-10-29 (×2): 50 ug via INTRAVENOUS

## 2015-10-29 MED ORDER — ZOLPIDEM TARTRATE 5 MG PO TABS: 5.0000 mg | ORAL_TABLET | Freq: Every evening | ORAL | Status: DC | PRN

## 2015-10-29 MED ORDER — IBUPROFEN 600 MG PO TABS
600.0000 mg | ORAL_TABLET | Freq: Four times a day (QID) | ORAL | Status: DC
  Administered 2015-10-29 – 2015-11-01 (×12): 600 mg via ORAL
  Filled 2015-10-29 (×12): qty 1

## 2015-10-29 MED ORDER — MENTHOL 3 MG MT LOZG: 1.0000 | LOZENGE | OROMUCOSAL | Status: DC | PRN

## 2015-10-29 MED ORDER — BUPIVACAINE HCL (PF) 0.5 % IJ SOLN
INTRAMUSCULAR | Status: AC
  Filled 2015-10-29: qty 30

## 2015-10-29 MED ORDER — QUETIAPINE FUMARATE 25 MG PO TABS
25.0000 mg | ORAL_TABLET | Freq: Every day | ORAL | Status: DC
  Administered 2015-10-29 – 2015-10-31 (×3): 25 mg via ORAL
  Filled 2015-10-29 (×5): qty 1

## 2015-10-29 MED ORDER — MIDAZOLAM HCL 2 MG/2ML IJ SOLN
INTRAMUSCULAR | Status: AC
  Filled 2015-10-29: qty 2

## 2015-10-29 MED ORDER — ONDANSETRON HCL 4 MG/2ML IJ SOLN
INTRAMUSCULAR | Status: DC | PRN
  Administered 2015-10-29: 4 mg via INTRAVENOUS

## 2015-10-29 MED ORDER — HYDROMORPHONE HCL 1 MG/ML IJ SOLN
INTRAMUSCULAR | Status: AC
  Filled 2015-10-29: qty 1

## 2015-10-29 MED ORDER — BUPIVACAINE HCL (PF) 0.5 % IJ SOLN
INTRAMUSCULAR | Status: DC | PRN
  Administered 2015-10-29: 30 mL

## 2015-10-29 MED ORDER — GENTAMICIN SULFATE 40 MG/ML IJ SOLN: 1.5000 mg/kg | Freq: Once | INTRAVENOUS | Status: DC

## 2015-10-29 MED ORDER — NALOXONE HCL 0.4 MG/ML IJ SOLN: 0.4000 mg | INTRAMUSCULAR | Status: DC | PRN

## 2015-10-29 MED ORDER — PRENATAL MULTIVITAMIN CH
1.0000 | ORAL_TABLET | Freq: Every day | ORAL | Status: DC
  Administered 2015-10-30 – 2015-11-01 (×3): 1 via ORAL
  Filled 2015-10-29 (×4): qty 1

## 2015-10-29 MED ORDER — BUPIVACAINE IN DEXTROSE 0.75-8.25 % IT SOLN: INTRATHECAL | Status: DC | PRN

## 2015-10-29 MED ORDER — DIPHENHYDRAMINE HCL 12.5 MG/5ML PO ELIX
12.5000 mg | ORAL_SOLUTION | Freq: Four times a day (QID) | ORAL | Status: DC | PRN
  Filled 2015-10-29: qty 5

## 2015-10-29 MED ORDER — LIDOCAINE HCL (CARDIAC) 20 MG/ML IV SOLN
INTRAVENOUS | Status: AC
  Filled 2015-10-29: qty 5

## 2015-10-29 MED ORDER — FENTANYL CITRATE (PF) 250 MCG/5ML IJ SOLN
INTRAMUSCULAR | Status: AC
  Filled 2015-10-29: qty 5

## 2015-10-29 MED ORDER — SIMETHICONE 80 MG PO CHEW: 80.0000 mg | CHEWABLE_TABLET | ORAL | Status: DC | PRN

## 2015-10-29 MED ORDER — PHENYLEPHRINE 8 MG IN D5W 100 ML (0.08MG/ML) PREMIX OPTIME
INJECTION | INTRAVENOUS | Status: DC | PRN
  Administered 2015-10-29: 60 ug/min via INTRAVENOUS

## 2015-10-29 MED ORDER — SIMETHICONE 80 MG PO CHEW
80.0000 mg | CHEWABLE_TABLET | ORAL | Status: DC
Start: 2015-10-30 — End: 2015-11-01
  Administered 2015-10-30 – 2015-10-31 (×3): 80 mg via ORAL
  Filled 2015-10-29 (×3): qty 1

## 2015-10-29 MED ORDER — KETOROLAC TROMETHAMINE 30 MG/ML IJ SOLN
INTRAMUSCULAR | Status: AC
  Filled 2015-10-29: qty 1

## 2015-10-29 MED ORDER — SIMETHICONE 80 MG PO CHEW
80.0000 mg | CHEWABLE_TABLET | Freq: Three times a day (TID) | ORAL | Status: DC
  Administered 2015-10-29 – 2015-11-01 (×9): 80 mg via ORAL
  Filled 2015-10-29 (×9): qty 1

## 2015-10-29 MED ORDER — OXYTOCIN 10 UNIT/ML IJ SOLN
INTRAVENOUS | Status: DC | PRN
  Administered 2015-10-29: 40 [IU] via INTRAVENOUS

## 2015-10-29 MED ORDER — OXYTOCIN 10 UNIT/ML IJ SOLN
INTRAMUSCULAR | Status: AC
  Filled 2015-10-29: qty 4

## 2015-10-29 MED ORDER — GENTAMICIN SULFATE 40 MG/ML IJ SOLN
Freq: Once | INTRAVENOUS | Status: AC
  Administered 2015-10-29: 115.5 mL via INTRAVENOUS
  Filled 2015-10-29: qty 9.5

## 2015-10-29 MED ORDER — MORPHINE SULFATE-NACL 0.5-0.9 MG/ML-% IV SOSY
PREFILLED_SYRINGE | INTRAVENOUS | Status: AC
  Filled 2015-10-29: qty 1

## 2015-10-29 MED ORDER — WITCH HAZEL-GLYCERIN EX PADS: 1.0000 "application " | MEDICATED_PAD | CUTANEOUS | Status: DC | PRN

## 2015-10-29 MED ORDER — BUPIVACAINE HCL (PF) 0.25 % IJ SOLN
INTRAMUSCULAR | Status: AC
  Filled 2015-10-29: qty 30

## 2015-10-29 MED ORDER — ONDANSETRON HCL 4 MG/2ML IJ SOLN: 4.0000 mg | Freq: Four times a day (QID) | INTRAMUSCULAR | Status: DC | PRN

## 2015-10-29 MED ORDER — CLINDAMYCIN PHOSPHATE 900 MG/50ML IV SOLN: 900.0000 mg | Freq: Once | INTRAVENOUS | Status: DC

## 2015-10-29 MED ORDER — ONDANSETRON HCL 4 MG/2ML IJ SOLN
INTRAMUSCULAR | Status: AC
  Filled 2015-10-29: qty 2

## 2015-10-29 MED ORDER — ACETAMINOPHEN 325 MG PO TABS: 650.0000 mg | ORAL_TABLET | ORAL | Status: DC | PRN

## 2015-10-29 MED ORDER — DIBUCAINE 1 % RE OINT: 1.0000 "application " | TOPICAL_OINTMENT | RECTAL | Status: DC | PRN

## 2015-10-29 MED ORDER — SODIUM CHLORIDE 0.9 % IR SOLN
Status: DC | PRN
Start: 2015-10-29 — End: 2015-10-29
  Administered 2015-10-29: 1

## 2015-10-29 MED ORDER — ACETAMINOPHEN 500 MG PO TABS
1000.0000 mg | ORAL_TABLET | Freq: Four times a day (QID) | ORAL | Status: DC | PRN
  Administered 2015-10-29: 1000 mg via ORAL

## 2015-10-29 MED ORDER — DIPHENHYDRAMINE HCL 25 MG PO CAPS: 25.0000 mg | ORAL_CAPSULE | Freq: Four times a day (QID) | ORAL | Status: DC | PRN

## 2015-10-29 MED ORDER — DIPHENHYDRAMINE HCL 50 MG/ML IJ SOLN
12.5000 mg | Freq: Four times a day (QID) | INTRAMUSCULAR | Status: DC | PRN
Start: 2015-10-29 — End: 2015-10-30

## 2015-10-29 MED ORDER — HYDROMORPHONE HCL 1 MG/ML IJ SOLN
0.2500 mg | INTRAMUSCULAR | Status: DC | PRN
  Administered 2015-10-29 (×2): 0.5 mg via INTRAVENOUS

## 2015-10-29 MED ORDER — TETANUS-DIPHTH-ACELL PERTUSSIS 5-2.5-18.5 LF-MCG/0.5 IM SUSP
0.5000 mL | Freq: Once | INTRAMUSCULAR | Status: AC
  Administered 2015-11-01: 0.5 mL via INTRAMUSCULAR
  Filled 2015-10-29: qty 0.5

## 2015-10-29 MED ORDER — COCONUT OIL OIL: 1.0000 "application " | TOPICAL_OIL | Status: DC | PRN

## 2015-10-29 MED ORDER — OXYTOCIN 40 UNITS IN LACTATED RINGERS INFUSION - SIMPLE MED: 2.5000 [IU]/h | INTRAVENOUS | Status: DC

## 2015-10-29 MED ORDER — FENTANYL 40 MCG/ML IV SOLN
INTRAVENOUS | Status: DC
  Administered 2015-10-29: 15:00:00 via INTRAVENOUS
  Administered 2015-10-29: 210 ug via INTRAVENOUS
  Administered 2015-10-30: 50 ug via INTRAVENOUS
  Administered 2015-10-30: 60 ug via INTRAVENOUS
  Filled 2015-10-29 (×2): qty 25

## 2015-10-29 MED ORDER — PROPOFOL 10 MG/ML IV BOLUS
INTRAVENOUS | Status: AC
Start: 2015-10-29 — End: 2015-10-29
  Filled 2015-10-29: qty 20

## 2015-10-29 MED ORDER — INSULIN ASPART 100 UNIT/ML ~~LOC~~ SOLN: 0.0000 [IU] | Freq: Three times a day (TID) | SUBCUTANEOUS | Status: DC

## 2015-10-29 SURGICAL SUPPLY — 41 items
BENZOIN TINCTURE PRP APPL 2/3 (GAUZE/BANDAGES/DRESSINGS) IMPLANT
BLADE TIP J-PLASMA PRECISE LAP (MISCELLANEOUS) ×3 IMPLANT
CHLORAPREP W/TINT 26ML (MISCELLANEOUS) ×3 IMPLANT
CLAMP CORD UMBIL (MISCELLANEOUS) IMPLANT
CLOSURE WOUND 1/2 X4 (GAUZE/BANDAGES/DRESSINGS)
CLOTH BEACON ORANGE TIMEOUT ST (SAFETY) ×3 IMPLANT
DRSG OPSITE POSTOP 4X10 (GAUZE/BANDAGES/DRESSINGS) ×3 IMPLANT
ELECT REM PT RETURN 9FT ADLT (ELECTROSURGICAL) ×3
ELECTRODE REM PT RTRN 9FT ADLT (ELECTROSURGICAL) ×1 IMPLANT
EXTRACTOR VACUUM KIWI (MISCELLANEOUS) IMPLANT
GLOVE BIO SURGEON STRL SZ7 (GLOVE) ×3 IMPLANT
GLOVE BIOGEL PI IND STRL 7.0 (GLOVE) ×2 IMPLANT
GLOVE BIOGEL PI INDICATOR 7.0 (GLOVE) ×4
GOWN STRL REUS W/TWL LRG LVL3 (GOWN DISPOSABLE) ×6 IMPLANT
GOWN STRL REUS W/TWL XL LVL3 (GOWN DISPOSABLE) ×3 IMPLANT
KIT ABG SYR 3ML LUER SLIP (SYRINGE) IMPLANT
NEEDLE HYPO 22GX1.5 SAFETY (NEEDLE) ×3 IMPLANT
NEEDLE HYPO 25X5/8 SAFETYGLIDE (NEEDLE) IMPLANT
NS IRRIG 1000ML POUR BTL (IV SOLUTION) ×3 IMPLANT
PACK C SECTION WH (CUSTOM PROCEDURE TRAY) ×3 IMPLANT
PAD OB MATERNITY 4.3X12.25 (PERSONAL CARE ITEMS) ×3 IMPLANT
PENCIL SMOKE EVAC W/HOLSTER (ELECTROSURGICAL) ×3 IMPLANT
RETRACTOR TRAXI PANNICULUS (MISCELLANEOUS) ×1 IMPLANT
RETRACTOR WND ALEXIS 25 LRG (MISCELLANEOUS) IMPLANT
RTRCTR C-SECT PINK 25CM LRG (MISCELLANEOUS) ×3 IMPLANT
RTRCTR WOUND ALEXIS 25CM LRG (MISCELLANEOUS)
SPONGE SURGIFOAM ABS GEL 12-7 (HEMOSTASIS) IMPLANT
STRIP CLOSURE SKIN 1/2X4 (GAUZE/BANDAGES/DRESSINGS) IMPLANT
SUT PDS AB 0 CTX 60 (SUTURE) IMPLANT
SUT PLAIN 0 NONE (SUTURE) IMPLANT
SUT PLAIN 2 0 (SUTURE) ×2
SUT PLAIN ABS 2-0 CT1 27XMFL (SUTURE) ×1 IMPLANT
SUT SILK 0 TIES 10X30 (SUTURE) IMPLANT
SUT VIC AB 0 CT1 36 (SUTURE) ×9 IMPLANT
SUT VIC AB 3-0 CT1 27 (SUTURE) ×2
SUT VIC AB 3-0 CT1 TAPERPNT 27 (SUTURE) ×1 IMPLANT
SUT VIC AB 4-0 KS 27 (SUTURE) IMPLANT
SYR CONTROL 10ML LL (SYRINGE) ×3 IMPLANT
TOWEL OR 17X24 6PK STRL BLUE (TOWEL DISPOSABLE) ×3 IMPLANT
TRAXI PANNICULUS RETRACTOR (MISCELLANEOUS) ×2
TRAY FOLEY CATH SILVER 14FR (SET/KITS/TRAYS/PACK) ×3 IMPLANT

## 2015-10-29 NOTE — Anesthesia Preprocedure Evaluation (Addendum)
Anesthesia Evaluation  Patient identified by MRN, date of birth, ID band Patient awake    Reviewed: Allergy & Precautions, NPO status , Patient's Chart, lab work & pertinent test results  History of Anesthesia Complications (+) history of anesthetic complications  Airway Mallampati: II  TM Distance: >3 FB Neck ROM: Full    Dental no notable dental hx.    Pulmonary asthma , former smoker,    Pulmonary exam normal breath sounds clear to auscultation       Cardiovascular negative cardio ROS Normal cardiovascular exam Rhythm:Regular Rate:Normal     Neuro/Psych negative neurological ROS  negative psych ROS   GI/Hepatic negative GI ROS, Neg liver ROS,   Endo/Other  diabetesMorbid obesity  Renal/GU negative Renal ROS  negative genitourinary   Musculoskeletal negative musculoskeletal ROS (+)   Abdominal (+) + obese,   Peds negative pediatric ROS (+)  Hematology  (+) anemia ,   Anesthesia Other Findings   Reproductive/Obstetrics negative OB ROS                             Anesthesia Physical Anesthesia Plan  ASA: III  Anesthesia Plan: Spinal   Post-op Pain Management:    Induction: Intravenous  Airway Management Planned: Natural Airway  Additional Equipment:   Intra-op Plan:   Post-operative Plan:   Informed Consent: I have reviewed the patients History and Physical, chart, labs and discussed the procedure including the risks, benefits and alternatives for the proposed anesthesia with the patient or authorized representative who has indicated his/her understanding and acceptance.   Dental advisory given  Plan Discussed with: CRNA  Anesthesia Plan Comments: (Informed consent obtained prior to proceeding including risk of failure, 1% risk of PDPH, risk of minor discomfort and bruising.  Discussed rare but serious complications including epidural abscess, permanent nerve injury,  epidural hematoma.  Discussed alternatives to spinal/epidural anesthesia/analgesia and patient desires to proceed.  Timeout performed pre-procedure verifying patient name, procedure, and platelet count.  Patient tolerated procedure well.   We had a long discussion of spinal versus general anesthesia for C/S. I read Dr. Lennox Grumbles note and discussed with Dr. Jillyn Hidden. [name] has come around to accepting spinal anesthesia as probably the best, safest approach for herself and her baby Kristen [name]. She requested and I agree we will not put any intrathecal narcotics in her spinal as severe unrelenting itching for five days postop was her greatest complaint after spinal anesthesia 12 years ago at [facility]. She will receive IV PCA narcotics postoperatively. She has had a problem with percocet in the past. Will discuss with Dr. Roselie Awkward. Questions answered. Consent given. Discussed GA backup. She accepts.)       Anesthesia Quick Evaluation

## 2015-10-29 NOTE — Transfer of Care (Signed)
Immediate Anesthesia Transfer of Care Note  Patient: Kristen [name]  Procedure(s) Performed: Procedure(s): CESAREAN SECTION (N/A)  Patient Location: PACU  Anesthesia Type:Spinal  Level of Consciousness: sedated  Airway & Oxygen Therapy: Patient Spontanous Breathing  Post-op Assessment: Report given to RN  Post vital signs: Reviewed and stable  Last Vitals:  Vitals:   10/29/15 0859 10/29/15 0900  BP:  119/71  Pulse:  87  Resp:    Temp: 36.8 C     Last Pain:  Vitals:   10/29/15 0859  TempSrc: Oral  PainSc:       Patients Stated Pain Goal: 9 ([id number])  Complications: No apparent anesthesia complications

## 2015-10-29 NOTE — Op Note (Signed)
Kristen [name] PROCEDURE DATE: 10/29/2015  PREOPERATIVE DIAGNOSES: Intrauterine pregnancy at [redacted]w[redacted]d weeks gestation; patient declines vag del attempt  POSTOPERATIVE DIAGNOSES: The same  PROCEDURE: Repeat Low Transverse Cesarean Section  SURGEON: Woodroe Mode, MD   ASSISTANT:  RN assist  ANESTHESIOLOGIST: Dr. Delma Post  INDICATIONS: Kristen [name] is a 35 y.o. [id number] at [redacted]w[redacted]d here for cesarean section secondary to the indications listed under preoperative diagnoses; please see preoperative note for further details.  The risks of cesarean section were discussed with the patient including but were not limited to: bleeding which may require transfusion or reoperation; infection which may require antibiotics; injury to bowel, bladder, ureters or other surrounding organs; injury to the fetus; need for additional procedures including hysterectomy in the event of a life-threatening hemorrhage; placental abnormalities wth subsequent pregnancies, incisional problems, thromboembolic phenomenon and other postoperative/anesthesia complications.   The patient concurred with the proposed plan, giving informed written consent for the procedure.    FINDINGS:  Viable female infant in cephalic presentation.  Apgars 8 and 9.  Clear amniotic fluid.  Intact placenta, three vessel cord.  Normal uterus, fallopian tubes and ovaries bilaterally.  ANESTHESIA: Spinal INTRAVENOUS FLUIDS: 1500 ml ESTIMATED BLOOD LOSS: 600 ml URINE OUTPUT:  100 ml SPECIMENS: Placenta sent to L&D COMPLICATIONS: None immediate  PROCEDURE IN DETAIL:  The patient preoperatively received intravenous antibiotics and had sequential compression devices applied to her lower extremities.  She was then taken to the operating room where spinal anesthesia was administered spinal anesthesia and was found to be adequate. She was then placed in a dorsal supine position with a leftward tilt, and prepped and draped in a sterile manner.  A foley  catheter was placed into her bladder and attached to constant gravity.  After an adequate timeout was performed, a Pfannenstiel skin incision was made with scalpel over her preexisting scar and carried through to the underlying layer of fascia. The fascia was incised in the midline, and this incision was extended bilaterally using the Mayo scissors.  Kocher clamps were applied to the superior aspect of the fascial incision and the underlying rectus muscles were dissected off bluntly.  A similar process was carried out on the inferior aspect of the fascial incision. The rectus muscles were separated in the midline bluntly and the peritoneum was entered bluntly. Attention was turned to the lower uterine segment where a low transverse hysterotomy was made with a scalpel and extended bilaterally bluntly.  The infant was successfully delivered, the cord was clamped and cut after one minute, and the infant was handed over to the awaiting neonatology team. Uterine massage was then administered, and the placenta delivered intact with a three-vessel cord. The uterus was then cleared of clots and debris.  The hysterotomy was closed with 0 Vicryl in a running locked fashion, and an imbricating layer was also placed with 0 Vicryl.  The pelvis was cleared of all clot and debris. Hemostasis was confirmed on all surfaces.  The peritoneum was closed with a 2-0 Vicryl running stitch. The fascia was then closed using 0 Vicryl in a running fashion.  The subcutaneous layer was irrigated, then reapproximated with 2-0 plain gut interrupted stitches, and 30 ml of 0.5% Marcaine was injected subcutaneously around the incision.  The skin was closed with a 4-0 Vicryl subcuticular stitch. The patient tolerated the procedure well. Sponge, lap, instrument and needle counts were correct x 3.  She was taken to the recovery room in stable condition.    Woodroe Mode, MD,  [place] Attending [place], [place]

## 2015-10-29 NOTE — Lactation Note (Signed)
This note was copied from a baby's chart. Lactation Consultation Note  Patient Name: Kristen [name] [id number] Date: 10/29/2015 Reason for consult: Initial assessment;Infant < 6lbs Infant is 5 hours old & seen by [facility] for initial assessment. Infant was born at [redacted]w[redacted]d & weighed 5 lbs 2.2oz at birth. This is mom's 5th baby & mom reports BF all of them for 11 months- 3 years. Mom does not have [place]. Baby has BF 2x since birth with latch scores of 6 & 7; baby has received 28mL of EBM 2x via syringe. Mom has h/o GDM; baby had BG reading of 38mg /dL at 1220 & then 75mg /dL at 1416. Next BG is scheduled at 1630. Baby was with dad when [facility] entered. Encouraged mom to hold baby skin-to-skin & soon after baby started showing feeding cues. Mom put baby to left breast in football hold & baby latched on and suckled. A few swallows were noted. Lab came in while mom was BF to check baby's BG. Baby continued to BF while lab did heal prick. Mom encouraged to feed baby 8-12 times/24 hours and with feeding cues. Encouraged mom to do a lot of skin-to-skin. Provided BF booklet, BF resources, & feeding log; mom made aware of O/P services, breastfeeding support groups, community resources, and our phone # for post-discharge questions. [place] left mom BF and encouraged mom to call for help with future feeds if needed.  Maternal Data Does the patient have breastfeeding experience prior to this delivery?: Yes  Feeding Feeding Type: Breast Fed Length of feed: 12 min  LATCH Score/Interventions Latch: Grasps breast easily, tongue down, lips flanged, rhythmical sucking. Intervention(s): Adjust position;Assist with latch;Breast compression  Audible Swallowing: A few with stimulation Intervention(s): Alternate breast massage;Skin to skin  Type of Nipple: Everted at rest and after stimulation  Comfort (Breast/Nipple): Soft / non-tender     Hold (Positioning): Assistance needed to correctly position infant at breast and maintain  latch. Intervention(s): Support Pillows;Skin to skin;Breastfeeding basics reviewed  LATCH Score: 8  Lactation Tools Discussed/Used WIC Program: No Pump Review:  (will review cleaning when pt leaves PACU) Initiated by:: Trey Paula RN Date initiated:: 10/29/15   Consult Status Consult Status: Follow-up Date: 10/30/15 Follow-up type: In-patient    [name] 10/29/2015, 5:06 PM

## 2015-10-29 NOTE — Consult Note (Signed)
Neonatology Note:   Attendance at C-section:    I was asked by Dr. Roselie Awkward to attend this C/S at term after failed IOL. The mother is a 35 y.o. female G12 [phone] @ 38.0 wks  presenting for IOL for IUGR and GDM (on metformin and insulin).  Mother also with alpha thalassemia. GBS negative with good prenatal care. ROM 21 hours before delivery with Gent/Clinda 1 hr PTD, fluid clear. No maternal fever.  Infant vigorous with good spontaneous cry and tone. 60 sec DCC. Needed only minimal bulb suctioning. Ap 8/9. Lungs clear to ausc in DR. To CN to care of Pediatrician. D/w mother and father potential glucose issues.  She prefers to breastfeed and is open to use of formula if necessary. Assured, we will support lactation.   Monia Sabal Katherina Mires, MD

## 2015-10-29 NOTE — Anesthesia Postprocedure Evaluation (Signed)
Anesthesia Post Note  Patient: Kristen [name]  Procedure(s) Performed: Procedure(s) (LRB): CESAREAN SECTION (N/A)  Patient location during evaluation: PACU Anesthesia Type: Spinal Level of consciousness: oriented and awake and alert Pain management: pain level controlled Vital Signs Assessment: post-procedure vital signs reviewed and stable Respiratory status: spontaneous breathing, respiratory function stable and patient connected to nasal cannula oxygen Cardiovascular status: blood pressure returned to baseline and stable Postop Assessment: no headache, no backache, patient able to bend at knees and spinal receding Anesthetic complications: no Comments: She states she can take tylenol. She was given IV toradol and PO tylenol in PACU. No complaints.     Last Vitals:  Vitals:   10/29/15 1303 10/29/15 1320  BP:  123/75  Pulse: 72 70  Resp: (!) 24 18  Temp:  36.8 C    Last Pain:  Vitals:   10/29/15 1320  TempSrc: Oral  PainSc:    Pain Goal: Patients Stated Pain Goal: 9 (10/28/15 0844)               [name] J

## 2015-10-29 NOTE — Lactation Note (Signed)
This note was copied from a baby's chart. Lactation Consultation Note  Patient Name: Kristen [name] [id number] Date: 10/29/2015 Reason for consult: Follow-up assessment;Infant < 6lbs Infant is 7 hours old & seen by [facility] for follow-up. BG at 1628 came back at 36 mg/dL so RN gave baby dextrose gel & 50mL of Alimentum via syringe at 1718. Baby was swaddled in crib when [facility] entered; [place] suggested mom try hand expressing to give infant more of her colostrum. Mom was able to hand express ~65mL going back & forth between both breasts. Baby was placed skin-to-skin with mom & baby was fed EBM via syringe. Soon after lab came in to check baby's BG so LC left baby skin-to-skin with mom. Encouraged mom to call if she has further questions later.  Maternal Data Does the patient have breastfeeding experience prior to this delivery?: Yes  Feeding Feeding Type: Breast Milk  LATCH Score/Interventions Latch: Grasps breast easily, tongue down, lips flanged, rhythmical sucking.  Audible Swallowing: A few with stimulation Intervention(s): Alternate breast massage;Skin to skin  Type of Nipple: Everted at rest and after stimulation  Comfort (Breast/Nipple): Soft / non-tender     Hold (Positioning): Assistance needed to correctly position infant at breast and maintain latch. Intervention(s): Support Pillows;Skin to skin;Breastfeeding basics reviewed  LATCH Score: 8  Lactation Tools Discussed/Used WIC Program: No   Consult Status Consult Status: Follow-up Date: 10/30/15 Follow-up type: In-patient    Kristen [name] 10/29/2015, 7:29 PM

## 2015-10-29 NOTE — Anesthesia Procedure Notes (Signed)
Spinal  Patient location during procedure: OR Staffing Anesthesiologist: Franne Grip Preanesthetic Checklist Completed: patient identified, site marked, surgical consent, pre-op evaluation, timeout performed, IV checked, risks and benefits discussed and monitors and equipment checked Spinal Block Patient position: sitting Prep: DuraPrep Patient monitoring: blood pressure, continuous pulse ox, cardiac monitor and heart rate Approach: midline Location: L4-5 Injection technique: single-shot Needle Needle type: Pencan  Needle gauge: 24 G Needle length: 10 cm Additional Notes CSF clear. No paresthesia.

## 2015-10-30 LAB — CBC
HCT: 27.5 % — ABNORMAL LOW (ref 36.0–46.0)
Hemoglobin: 8.9 g/dL — ABNORMAL LOW (ref 12.0–15.0)
MCH: 21 pg — ABNORMAL LOW (ref 26.0–34.0)
MCHC: 32.4 g/dL (ref 30.0–36.0)
MCV: 65 fL — AB (ref 78.0–100.0)
Platelets: 208 10*3/uL (ref 150–400)
RBC: 4.23 MIL/uL (ref 3.87–5.11)
RDW: 16.8 % — ABNORMAL HIGH (ref 11.5–15.5)
WBC: 13.1 10*3/uL — AB (ref 4.0–10.5)

## 2015-10-30 LAB — GLUCOSE, CAPILLARY
GLUCOSE-CAPILLARY: 83 mg/dL (ref 65–99)
GLUCOSE-CAPILLARY: 111 mg/dL — AB (ref 65–99)
GLUCOSE-CAPILLARY: 66 mg/dL (ref 65–99)
Glucose-Capillary: 111 mg/dL — ABNORMAL HIGH (ref 65–99)
Glucose-Capillary: 118 mg/dL — ABNORMAL HIGH (ref 65–99)

## 2015-10-30 MED ORDER — METFORMIN HCL ER 500 MG PO TB24
1000.0000 mg | ORAL_TABLET | Freq: Two times a day (BID) | ORAL | Status: DC
  Filled 2015-10-30 (×6): qty 2

## 2015-10-30 MED ORDER — PNEUMOCOCCAL VAC POLYVALENT 25 MCG/0.5ML IJ INJ
0.5000 mL | INJECTION | INTRAMUSCULAR | Status: AC
  Administered 2015-11-01: 0.5 mL via INTRAMUSCULAR
  Filled 2015-10-30 (×2): qty 0.5

## 2015-10-30 MED ORDER — HYDROMORPHONE HCL 2 MG PO TABS
2.0000 mg | ORAL_TABLET | ORAL | Status: DC | PRN
  Administered 2015-10-30 – 2015-11-01 (×10): 2 mg via ORAL
  Filled 2015-10-30 (×10): qty 1

## 2015-10-30 MED ORDER — POLYSACCHARIDE IRON COMPLEX 150 MG PO CAPS
150.0000 mg | ORAL_CAPSULE | Freq: Every day | ORAL | Status: DC
  Administered 2015-10-30 – 2015-11-01 (×3): 150 mg via ORAL
  Filled 2015-10-30 (×3): qty 1

## 2015-10-30 NOTE — Progress Notes (Addendum)
Pt requested to be set up with DEBP to post up to help stimulate her milk production.  Pump was set up and education completed. Pt verbalizes understanding of pump.

## 2015-10-30 NOTE — Progress Notes (Signed)
Hypoglycemic Event  CBG: 66  Treatment: 15 GM carbohydrate snack  Symptoms: Shaky  Follow-up CBG: Time 1830 CBG Result:111  Possible Reasons for Event: Unknown  Comments/MD notified Dr.Degele    Kristen [name]

## 2015-10-30 NOTE — Progress Notes (Signed)
Dressing changed with sterile procedure per MD order. Patient encouraged to pump and feed infant upon cues and supplement.

## 2015-10-30 NOTE — Progress Notes (Signed)
Patient was supposed to call out for  A AC CBG before lunch and patient forgot. Will do a 2 hour post parandial CBG. Patient informed again to make sure she calls out before she ate.

## 2015-10-30 NOTE — Lactation Note (Addendum)
This note was copied from a baby's chart. Lactation Consultation Note  Patient Name: Kristen [name] [id number] Date: 10/30/2015 Reason for consult: Follow-up assessment;Infant < 6lbs;Other (Comment) (Baby has been jittery.)  Baby 102 hours old, mom GDM on Metformin and Insulin. Mom's bedside RN, Lanelle Bal at bedside and states that baby jittery. Lanelle Bal, RN reports that she has an order to give formula. Mom has DEBP at bedside and states that she knows how to use and clean. However, mom reports that she is not getting any EBM with the pump or with hand expression. Offered to assist with hand expression and/or pumping, but mom declined. Mom given supplementation guidelines per discussion with RN, Lanelle Bal and curve-tipped syringe with review. Enc mom to postpump with DEBP after each feeding. Discussed progression of milk coming to volume. Enc mom to continue to put baby to breast and offer lots of STS. Enc mom to call out for assistance with latching the baby as needed.   Maternal Data    Feeding Feeding Type: Breast Fed Length of feed: 8 min  LATCH Score/Interventions                      Lactation Tools Discussed/Used     Consult Status Consult Status: Follow-up Date: 10/31/15 Follow-up type: In-patient    Andres Labrum 10/30/2015, 12:06 PM

## 2015-10-30 NOTE — Progress Notes (Signed)
Patient encouraged to ambulate halls. Pain med information given.

## 2015-10-30 NOTE — Progress Notes (Signed)
Subjective: Postpartum Day #1: Cesarean Delivery Patient reports incisional pain, tolerating PO and no problems voiding.  Breast feeding is going well.    Objective: Vital signs in last 24 hours: Temp:  [97 F (36.1 C)-98.6 F (37 C)] 98.6 F (37 C) (09/05 0503) Pulse Rate:  [66-94] 79 (09/05 0503) Resp:  [11-28] 18 (09/05 0503) BP: (114-136)/(64-85) 136/68 (09/05 0503) SpO2:  [93 %-100 %] 96 % (09/05 0503) FiO2 (%):  [0 %-21 %] 21 % (09/04 1755)  Physical Exam:  General: alert, cooperative and no distress Lochia: appropriate Uterine Fundus: firm Incision: healing well, no dehiscence, no significant erythema DVT Evaluation: No evidence of DVT seen on physical exam. No cords or calf tenderness. No significant calf/ankle edema.   Recent Labs  10/28/15 0835 10/30/15 0527  HGB 10.2* 8.9*  HCT 31.1* 27.5*    Assessment/Plan: Status post Cesarean section. Doing well postoperatively.  Continue current care. Anemia: Niferex started.  PCA d/c'd this AM; dilaudid started.  Dressing change ordered.  Tashiya Souders A Zarea Diesing 10/30/2015, 8:08 AM

## 2015-10-30 NOTE — Anesthesia Postprocedure Evaluation (Signed)
Anesthesia Post Note  Patient: Kristen [name]  Procedure(s) Performed: Procedure(s) (LRB): CESAREAN SECTION (N/A)  Patient location during evaluation: Mother Baby Anesthesia Type: Spinal Level of consciousness: awake Pain management: satisfactory to patient Vital Signs Assessment: post-procedure vital signs reviewed and stable Respiratory status: spontaneous breathing Cardiovascular status: stable Anesthetic complications: no     Last Vitals:  Vitals:   10/30/15 0135 10/30/15 0503  BP:  136/68  Pulse:  79  Resp: 20 18  Temp:  37 C    Last Pain:  Vitals:   10/30/15 0729  TempSrc:   PainSc: 2    Pain Goal: Patients Stated Pain Goal: 9 (10/28/15 0844)               Casimer Lanius

## 2015-10-30 NOTE — Addendum Note (Signed)
Addendum  created 10/30/15 0745 by Asher Muir, CRNA   Sign clinical note

## 2015-10-30 NOTE — Progress Notes (Signed)
Patient's HS cbg was 118 at 2200, approximately 2 hrs post prandial. She has metformin ordered for in morning but encouraged pt to see what 0700 cbg is and talk with MD before taking metformin. She ate dinner at approximately 2000pm. She has required no insulin since baby delivered.

## 2015-10-31 ENCOUNTER — Inpatient Hospital Stay (HOSPITAL_COMMUNITY): Payer: Medicaid Other

## 2015-10-31 ENCOUNTER — Encounter (HOSPITAL_COMMUNITY): Payer: Self-pay | Admitting: Obstetrics & Gynecology

## 2015-10-31 LAB — GLUCOSE, CAPILLARY
GLUCOSE-CAPILLARY: 84 mg/dL (ref 65–99)
GLUCOSE-CAPILLARY: 82 mg/dL (ref 65–99)
Glucose-Capillary: 84 mg/dL (ref 65–99)
Glucose-Capillary: 101 mg/dL — ABNORMAL HIGH (ref 65–99)

## 2015-10-31 NOTE — Lactation Note (Signed)
This note was copied from a baby's chart. Lactation Consultation Note: Mother requesting information on Late term and Near term infants. Mother was given parent instruction sheet for LPT infants. She has understanding the her small infant may exibit some characteristics of near term infants. Mother is doing a great job breastfeeding. She was advised to supplement after each feeding. Mother states that breastfeeding is going well.   Patient Name: Boy Hawwa Westby [id number] Date: 10/31/2015 Reason for consult: Follow-up assessment   Maternal Data    Feeding Feeding Type: Breast Milk Length of feed: 30 min  LATCH Score/Interventions                      Lactation Tools Discussed/Used     Consult Status      Darla Lesches 10/31/2015, 3:25 PM

## 2015-10-31 NOTE — Progress Notes (Signed)
Subjective: Postpartum Day #2: Cesarean Delivery Patient reports incisional pain, tolerating PO, + flatus and no problems voiding.    Objective: Vital signs in last 24 hours: Temp:  [97.7 F (36.5 C)-98.7 F (37.1 C)] 98.7 F (37.1 C) (09/06 0755) Pulse Rate:  [70-86] 86 (09/06 0755) Resp:  [18-20] 20 (09/06 0755) BP: (107-123)/(57-81) 107/57 (09/06 0755) SpO2:  [96 %] 96 % (09/05 0827)  Physical Exam:  General: alert, cooperative and no distress Lochia: appropriate Uterine Fundus: firm Incision: healing well, no significant drainage, no dehiscence, no significant erythema DVT Evaluation: No evidence of DVT seen on physical exam. No cords or calf tenderness. No significant calf/ankle edema.   Recent Labs  10/28/15 0835 10/30/15 0527  HGB 10.2* 8.9*  HCT 31.1* 27.5*    Assessment/Plan: Status post Cesarean section. Doing well postoperatively.  Continue current care.  Anemia: stable.  Abdominal binder ordered for incisional pain.  Tolerating Dilaudid well.   Anticipate Discharge in AM.    Morene Crocker, CNM 10/31/2015, 7:58 AM

## 2015-10-31 NOTE — Plan of Care (Signed)
Problem: Pain Management: Goal: General experience of comfort will improve and pain level will decrease Outcome: Completed/Met Date Met: 10/31/15 Midwife ordered abdominal binder for patient. Placed on patient and patient feels less discomfort once placed and less pulling at incision.

## 2015-11-01 ENCOUNTER — Other Ambulatory Visit (HOSPITAL_COMMUNITY): Payer: Medicaid Other

## 2015-11-01 ENCOUNTER — Ambulatory Visit (HOSPITAL_COMMUNITY): Payer: Medicaid Other

## 2015-11-01 ENCOUNTER — Encounter (HOSPITAL_COMMUNITY): Payer: Self-pay | Admitting: Certified Nurse Midwife

## 2015-11-01 LAB — GLUCOSE, CAPILLARY
GLUCOSE-CAPILLARY: 80 mg/dL (ref 65–99)
GLUCOSE-CAPILLARY: 102 mg/dL — AB (ref 65–99)

## 2015-11-01 MED ORDER — POLYSACCHARIDE IRON COMPLEX 150 MG PO CAPS: 150.0000 mg | ORAL_CAPSULE | Freq: Every day | ORAL | 2 refills | Status: DC

## 2015-11-01 MED ORDER — IBUPROFEN 600 MG PO TABS: 600.0000 mg | ORAL_TABLET | Freq: Four times a day (QID) | ORAL | 2 refills | Status: DC

## 2015-11-01 MED ORDER — COCONUT OIL OIL: 1.0000 "application " | TOPICAL_OIL | 99 refills | Status: DC | PRN

## 2015-11-01 MED ORDER — HYDROMORPHONE HCL 2 MG PO TABS: 2.0000 mg | ORAL_TABLET | ORAL | 0 refills | Status: DC | PRN

## 2015-11-01 MED ORDER — SENNOSIDES-DOCUSATE SODIUM 8.6-50 MG PO TABS: 2.0000 | ORAL_TABLET | ORAL | 1 refills | Status: DC

## 2015-11-01 NOTE — Discharge Summary (Signed)
Obstetric Discharge Summary Reason for Admission: cesarean section and preterm delivery and labor Prenatal Procedures: NST and ultrasound Intrapartum Procedures: cesarean: low cervical, transverse Postpartum Procedures: none Complications-Operative and Postpartum: none Hemoglobin  Date Value Ref Range Status  10/30/2015 8.9 (L) 12.0 - 15.0 g/dL Final   HCT  Date Value Ref Range Status  10/30/2015 27.5 (L) 36.0 - 46.0 % Final    Physical Exam:  General: alert, cooperative and no distress Lochia: appropriate Uterine Fundus: firm Incision: healing well, no significant drainage, no dehiscence, no significant erythema DVT Evaluation: No evidence of DVT seen on physical exam. Negative Homan's sign. No cords or calf tenderness. No significant calf/ankle edema.  Discharge Diagnoses: Premature labor and Preterm delivery with IUGR, Repeat C-section  Discharge Information: Date: 11/01/2015 Activity: pelvic rest Diet: routine Medications: PNV, Ibuprofen, Colace, Iron and Diluadid Condition: stable Instructions: refer to practice specific booklet Discharge to: home Follow-up [name], CNM. Schedule an appointment as soon as possible for a visit in 2 week(s).   Specialty:  Certified Nurse Midwife Why:  incision check Contact information: [address] [place] [address] [phone]           Newborn Data: Live born female  Birth Weight: 5 lb 2.2 oz (2330 g) APGAR: 8, 9  Home with mother.  Kristen [name], CNM 11/01/2015, 7:49 AM

## 2015-11-01 NOTE — Lactation Note (Signed)
This note was copied from a baby's chart. Lactation Consultation Note  P5.  Mother has been pumping approx 50-55 ml per pumping session. She states baby is latching well.  Recommend she call LC to view next feeding. Mother is supplementing w/ pumped breastmilk first and then the difference w/ formula when baby is able. Outpatient appt Tues 9/12 at 9am. Reviewed engorgement care and monitoring voids/stools.   Patient Name: Kristen [name] [id number] Date: 11/01/2015 Reason for consult: Follow-up assessment   Maternal Data    Feeding Feeding Type: Breast Fed Length of feed: 25 min  LATCH Score/Interventions                      Lactation Tools Discussed/Used     Consult Status Consult Status: Follow-up Date: 11/06/15 Follow-up type: In-patient    Vivianne Master [facility] 11/01/2015, 9:42 AM

## 2015-11-01 NOTE — Discharge Instructions (Signed)
°Iron-Rich Diet ° °Iron is a mineral that helps your body to produce hemoglobin. Hemoglobin is a protein in your red blood cells that carries oxygen to your body's tissues. Eating too little iron may cause you to feel weak and tired, and it can increase your risk for infection. Eating enough iron is necessary for your body's metabolism, muscle function, and nervous system. °Iron is naturally found in many foods. It can also be added to foods or fortified in foods. There are two types of dietary iron: °· Heme iron. Heme iron is absorbed by the body more easily than nonheme iron. Heme iron is found in meat, poultry, and fish. °· Nonheme iron. Nonheme iron is found in dietary supplements, iron-fortified grains, beans, and vegetables. °You may need to follow an iron-rich diet if: °· You have been diagnosed with iron deficiency or iron-deficiency anemia. °· You have a condition that prevents you from absorbing dietary iron, such as: °¨ Infection in your intestines. °¨ Celiac disease. This involves long-lasting (chronic) inflammation of your intestines. °· You do not eat enough iron. °· You eat a diet that is high in foods that impair iron absorption. °· You have lost a lot of blood. °· You have heavy bleeding during your menstrual cycle. °· You are pregnant. °WHAT IS MY PLAN? °Your health care provider may help you to determine how much iron you need per day based on your condition. Generally, when a person consumes sufficient amounts of iron in the diet, the following iron needs are met: °· Men. °¨ 14-18 years old: 11 mg per day. °¨ 19-50 years old: 8 mg per day. °· Women.   °¨ 14-18 years old: 15 mg per day. °¨ 19-50 years old: 18 mg per day. °¨ Over 50 years old: 8 mg per day. °¨ Pregnant women: 27 mg per day. °¨ Breastfeeding women: 9 mg per day. °WHAT DO I NEED TO KNOW ABOUT AN IRON-RICH DIET? °· Eat fresh fruits and vegetables that are high in vitamin C along with foods that are high in iron. This will help  increase the amount of iron that your body absorbs from food, especially with foods containing nonheme iron. Foods that are high in vitamin C include oranges, peppers, tomatoes, and mango. °· Take iron supplements only as directed by your health care provider. Overdose of iron can be life-threatening. If you were prescribed iron supplements, take them with orange juice or a vitamin C supplement. °· Cook foods in pots and pans that are made from iron.   °· Eat nonheme iron-containing foods alongside foods that are high in heme iron. This helps to improve your iron absorption.   °· Certain foods and drinks contain compounds that impair iron absorption. Avoid eating these foods in the same meal as iron-rich foods or with iron supplements. These include: °¨ Coffee, black tea, and red wine. °¨ Milk, dairy products, and foods that are high in calcium. °¨ Beans, soybeans, and peas. °¨ Whole grains. °· When eating foods that contain both nonheme iron and compounds that impair iron absorption, follow these tips to absorb iron better.   °¨ Soak beans overnight before cooking. °¨ Soak whole grains overnight and drain them before using. °¨ Ferment flours before baking, such as using yeast in bread dough. °WHAT FOODS CAN I EAT? °Grains  °Iron-fortified breakfast cereal. Iron-fortified whole-wheat bread. Enriched rice. Sprouted grains. °Vegetables  °Spinach. Potatoes with skin. Green peas. Broccoli. Red and green bell peppers. Fermented vegetables. °Fruits  °Prunes. Raisins. Oranges. Strawberries. Mango. Grapefruit. °Meats and Other   Protein Sources  °Beef liver. Oysters. Beef. Shrimp. Turkey. Chicken. Tuna. Sardines. Chickpeas. Nuts. Tofu. °Beverages  °Tomato juice. Fresh orange juice. Prune juice. Hibiscus tea. Fortified instant breakfast shakes. °Condiments  °Tahini. Fermented soy sauce.  °Sweets and Desserts  °Black-strap molasses.  °Other  °Wheat germ. °The items listed above may not be a complete list of recommended foods or  beverages. Contact your dietitian for more options.  °WHAT FOODS ARE NOT RECOMMENDED? °Grains  °Whole grains. Bran cereal. Bran flour. Oats. °Vegetables  °Artichokes. Brussels sprouts. Kale. °Fruits  °Blueberries. Raspberries. Strawberries. Figs. °Meats and Other Protein Sources  °Soybeans. Products made from soy protein. °Dairy  °Milk. Cream. Cheese. Yogurt. Cottage cheese. °Beverages  °Coffee. Black tea. Red wine. °Sweets and Desserts  °Cocoa. Chocolate. Ice cream. °Other  °Basil. Oregano. Parsley. °The items listed above may not be a complete list of foods and beverages to avoid. Contact your dietitian for more information.  °  °This information is not intended to replace advice given to you by your health care provider. Make sure you discuss any questions you have with your health care provider. °  °Document Released: 09/24/2004 Document Revised: 03/03/2014 Document Reviewed: 09/07/2013 °Elsevier Interactive Patient Education ©2016 Elsevier Inc. ° ° ° ° °

## 2015-11-01 NOTE — Lactation Note (Signed)
This note was copied from a baby's chart. Lactation Consultation Note  Baby <  5 lbs.  Mother easily expressed bm. Was able to latch baby briefly for 5 min off and on but he was sleepy. He was eager to latch but then fell asleep while sucking. Suggest mother supplement with her breastmilk first and then the difference w/ formula. Reviewed engorgement care and monitoring voids/stools. Made OP appoint for 9/12.  Patient Name: Kristen [name] [id number] Date: 11/01/2015 Reason for consult: Follow-up assessment   Maternal Data    Feeding Feeding Type: Breast Milk with Formula added Length of feed: 5 min  LATCH Score/Interventions Latch: Repeated attempts needed to sustain latch, nipple held in mouth throughout feeding, stimulation needed to elicit sucking reflex.  Audible Swallowing: None  Type of Nipple: Everted at rest and after stimulation  Comfort (Breast/Nipple): Soft / non-tender     Hold (Positioning): No assistance needed to correctly position infant at breast.  LATCH Score: 7  Lactation Tools Discussed/Used     Consult Status Consult Status: Complete Date: 11/06/15 Follow-up type: In-patient    Vivianne Master [facility] 11/01/2015, 11:33 AM

## 2015-11-01 NOTE — Progress Notes (Signed)
Subjective: Postpartum Day #3: Cesarean Delivery Patient reports incisional pain, tolerating PO, + flatus, + BM and no problems voiding.  Breast feeding is going well.   Objective: Vital signs in last 24 hours: Temp:  [98.4 F (36.9 C)-98.7 F (37.1 C)] 98.4 F (36.9 C) (09/06 1800) Pulse Rate:  [86] 86 (09/06 1800) Resp:  [18-20] 18 (09/06 1800) BP: (107-109)/(57) 109/57 (09/06 1800)  Physical Exam:  General: alert, cooperative and no distress Lochia: appropriate Uterine Fundus: firm Incision: healing well, no significant drainage, no dehiscence, no significant erythema DVT Evaluation: No evidence of DVT seen on physical exam. Negative Homan's sign. No cords or calf tenderness. No significant calf/ankle edema.   Recent Labs  10/30/15 0527  HGB 8.9*  HCT 27.5*    Assessment/Plan: Status post Cesarean section. Doing well postoperatively.  Discharge home with standard precautions.  Follow up in office in 1-2 weeks.  Planning ParaGard IUD  Morene Crocker, CNM 11/01/2015, 7:41 AM

## 2015-11-06 ENCOUNTER — Ambulatory Visit (HOSPITAL_COMMUNITY)
Admit: 2015-11-06 | Discharge: 2015-11-06 | Disposition: A | Discharge status: Home / Self Care | Payer: Medicaid Other | Attending: Obstetrics and Gynecology | Admitting: Obstetrics and Gynecology

## 2015-11-06 NOTE — Lactation Note (Signed)
Lactation Consult  Mother's reason for visit:  Consult regarding IUGR Visit Type:  OP Appointment Notes:  Kristen [name] is 6 DOL and is back to BW.  Mom always BF him first and feeds him an additional 1 about 4 times a day.  She is very in tune to the needs of her baby.  Today he latched and transferred 24 ml on the first breast in a reasonable amount of time. He then attached to the other breast and ate 25 ml.  He was briefly satisfied but went back to the breast about 20 minutes later.  Mother reports that this is typical. His total intake was 62 ml. He then fell asleep.  Mom is feeding him on cue. She reports that he seems to tire easily at the breast.  He also has a shallow latch and tends to want to latch on to only the nipple.  Encouraged mother to feed him as she has been while working on a deeper latch, post-pump 4 times in 24 hours to protect her supply,add brief tummy time with neck ROM,tongue exercises and laid back BF to encourage a deeper latch. Follow-up planned. Possible posterior tongue tie. Will see if BF improves as he grows. Consult:  Initial Lactation Consultant:  Van Clines  ________________________________________________________________________  Sharene Skeans Name:  Kristen [name] Date of Birth:  10/29/2014 Pediatrician: Tami Ribas and Jodell Cipro Gender:  female Gestational Age: [redacted]w[redacted]d (At Birth) Birth Weight:  5 lb 2.2 oz (2330 g) Weight at Discharge: 4# 14.3 Weight: (!) 4 lb 14.3 oz (2220 g)                                             Date of Discharge:  11/01/2015      Filed Weights   10/31/15 0000 11/01/15 0030 11/01/15 1200  Weight: (!) 4 lb 12.2 oz (2160 g) (!) 4 lb 11 oz (2125 g) (!) 4 lb 14.3 oz (2220 g)  Last weight taken from location outside of [facility]:   5#2.5  but had a cloth diaper on that weighs over 3 oz   Location:Pediatrician's office Weight today:  5# 2.1 oz 2328 (disposable  diaper)   ________________________________________________________________________  Mother's Name: Kristen [name] Type of delivery:  emergency cesarean Breastfeeding Experience:  BF 4 other children at least one year Maternal Medical Conditions:  asthma, alpha thalssemia Maternal Medications:  Ibuprofen, stool softener, dilaudid twice a day,PNV  ________________________________________________________________________  Breastfeeding History (Post Discharge)  Frequency of breastfeeding: 11- 13 feeds Duration of feeding:  60-90 minutes     Infant Intake and Output Assessment  Voids:  6+ in 24 hrs.  Color:  Clear yellow Stools:  6+ in 24 hrs.  Color:  Yellow  ________________________________________________________________________  Maternal Breast Assessment  Breast:  Soft, large Nipple:  Erect Pain level:  0 Pain interventions:  NA

## 2015-11-08 ENCOUNTER — Other Ambulatory Visit (HOSPITAL_COMMUNITY): Payer: Medicaid Other

## 2015-11-12 ENCOUNTER — Ambulatory Visit (HOSPITAL_COMMUNITY)
Admission: RE | Admit: 2015-11-12 | Discharge: 2015-11-12 | Disposition: A | Discharge status: Home / Self Care | Payer: Medicaid Other | Source: Ambulatory Visit | Attending: Obstetrics | Admitting: Obstetrics

## 2015-11-12 NOTE — Lactation Note (Signed)
Lactation Consult for [name] (mother) and Kristen Kristen [name] (DOB: [birth year])  Mother's reason for visit: f/u from 11/06/15 Consult:  Follow-Up Lactation Consultant:  Kristen Kristen [name]  ________________________________________________________________________ BW: 2330g (5# 2.2oz) 11-02-15: 4# 14.5oz (4.% below BW) 11-06-15: 5# 2.1oz Today's weight: 5# 10.2oz  ________________________________________________________________________  Mother's Name: Kristen Kristen [name] Type of delivery:  C/S  Breastfeeding Experience: 67mo - 3 years  Maternal Medical Conditions:  Gestational diabetes mellitis Maternal Medications: PNV, IB, stool softener  ________________________________________________________________________  Breastfeeding History (Post Discharge)  Frequency of breastfeeding: 8-12 times/day Duration of feeding: 8-15 min  Supplementation  Breastmilk:  Volume 15-63ml Frequency: 3-4 times/night.   Method:  Bottle. Dr. Saul [name] preemie. Mom reports that infant does not consume large quantities w/bottle.   Medela PIS: Pumps tid x 10-15 min: 5-8oz/session. See note below about pumping.  Infant Intake and Output Assessment  Voids:10-12 in 24 hrs.  Color:  Clear yellow Stools: 8 in 24 hrs.  Color:  Yellow  ________________________________________________________________________  Maternal Breast Assessment  Breast:  Full Nipple:  Erect   _______________________________________________________________________ Feeding Assessment/Evaluation  Initial feeding assessment:  Infant's oral assessment:  WNL  Attached assessment:  Deep  Lips flanged:  Yes.    Suck assessment:  Nutritive  Pre-feed weight: 2558 g  Post-feed weight: 2582 g  Amount transferred: 24 ml 7 min; L breast, leaning Kristen [name]  Pre-feed weight:  2582 g Post-feed weight: 2600 g  Amount transferred: 18 ml 5 min; L breast, leaning Kristen [name]  Total amount transferred: 42 ml  "Kristen Kristen [name]" is 61 weeks old and  is 8 oz above BW. He has gained 8 oz over the last 6 days. Mom reports that nursing has improved since the last [name] visit. She has also been nursing in the laid-Kristen [name] position, which she has found helpful.   Kristen Kristen [name] latches w/ease and Mom is comfortable w/feedings. Sometimes Kristen [name]'s latch looks a little non-traditional, but it does not impede swallows or cause discomfort. Mom's nipples were not misshapened when he released his latches.   Mom has an abundant supply. She can pump 5-8 oz of breast milk in 8 min. (Mom donated to the [facility] in [place] with her 1st 2 children). Mom had mentioned that pumping sometimes hurts. After discussion, we realized it was because she felt that she had to pump for the full 15 min. Mom now knows that she can stop pumping when her flow stops.   I have no concerns. I wrote down our free breastfeeding support groups, (in case she'd like to drop by for weight checks) & reminded Mom about expected growth spurts.  Kristen Dodge, RN, IBCLC

## 2015-11-22 ENCOUNTER — Ambulatory Visit (INDEPENDENT_AMBULATORY_CARE_PROVIDER_SITE_OTHER): Payer: Medicaid Other | Admitting: Obstetrics

## 2015-11-22 ENCOUNTER — Encounter: Payer: Self-pay | Admitting: Obstetrics

## 2015-11-22 DIAGNOSIS — Z3009 Encounter for other general counseling and advice on contraception: Secondary | ICD-10-CM

## 2015-11-22 NOTE — Progress Notes (Signed)
Subjective:     Kristen [name] is a 35 y.o. female who presents for a postpartum visit. She is 3 weeks postpartum following a low cervical transverse Cesarean section. I have fully reviewed the prenatal and intrapartum course. The delivery was at 34 gestational weeks. Outcome: primary cesarean section, low transverse incision. Anesthesia: spinal. Postpartum course has been normal. Baby's course has been normal. Baby is feeding by both breast and bottle - Similac Advance. Bleeding thin lochia. Bowel function is normal. Bladder function is normal. Patient is not sexually active. Contraception method is abstinence. Postpartum depression screening: negative.  Tobacco, alcohol and substance abuse history reviewed.  Adult immunizations reviewed including TDAP, rubella and varicella.  The following portions of the patient's history were reviewed and updated as appropriate: allergies, current medications, past family history, past medical history, past social history, past surgical history and problem list.  Review of Systems A comprehensive review of systems was negative.   Objective:    BP (!) 129/91   Pulse 86   Temp 98.3 F (36.8 C)   Wt (!) 323 lb (146.5 kg)   Breastfeeding? Yes   BMI 56.32 kg/m    PE:  General:  Alert and no distress Abdomen:  Sof, NT.  Incision C, D, I. Extremities:  No C, C, E.   50% of 15 min visit spent on counseling and coordination of care.   Assessment:    2 weeks postpartum.  Doing well.  Contraceptive counseling and advice  Plan:    1. Contraception: IUD 2. Educational material dispensed on IUD 3. Follow up in: 4 weeks or as needed.   Healthy lifestyle practices reviewed

## 2015-11-29 ENCOUNTER — Encounter: Payer: Self-pay | Admitting: Obstetrics

## 2015-12-21 ENCOUNTER — Other Ambulatory Visit (HOSPITAL_COMMUNITY)
Admission: RE | Admit: 2015-12-21 | Discharge: 2015-12-21 | Disposition: A | Discharge status: Home / Self Care | Payer: Medicaid Other | Source: Ambulatory Visit | Attending: Obstetrics | Admitting: Obstetrics

## 2015-12-21 ENCOUNTER — Ambulatory Visit: Payer: Self-pay | Admitting: Obstetrics

## 2015-12-21 ENCOUNTER — Ambulatory Visit (INDEPENDENT_AMBULATORY_CARE_PROVIDER_SITE_OTHER): Payer: Medicaid Other | Admitting: Certified Nurse Midwife

## 2015-12-21 ENCOUNTER — Encounter: Payer: Self-pay | Admitting: Certified Nurse Midwife

## 2015-12-21 VITALS — BP 111/79 | HR 88 | Temp 97.8°F | Wt 229.0 lb

## 2015-12-21 DIAGNOSIS — Z3040 Encounter for surveillance of contraceptives, unspecified: Secondary | ICD-10-CM

## 2015-12-21 DIAGNOSIS — Z3202 Encounter for pregnancy test, result negative: Secondary | ICD-10-CM

## 2015-12-21 DIAGNOSIS — Z01419 Encounter for gynecological examination (general) (routine) without abnormal findings: Secondary | ICD-10-CM | POA: Diagnosis present

## 2015-12-21 DIAGNOSIS — Z1151 Encounter for screening for human papillomavirus (HPV): Secondary | ICD-10-CM | POA: Insufficient documentation

## 2015-12-21 LAB — POCT URINE PREGNANCY: Preg Test, Ur: NEGATIVE

## 2015-12-21 NOTE — Progress Notes (Signed)
Subjective:     Kristen [name] is a 35 y.o. female who presents for a postpartum visit. She is 7 weeks postpartum following a low cervical transverse Cesarean section. I have fully reviewed the prenatal and intrapartum course. The delivery was at 38.1 gestational weeks. Outcome: primary cesarean section, low transverse incision. Anesthesia: spinal. Postpartum course has been normal. Baby's course has been normal. Baby is feeding by breast. Bleeding no bleeding. Bowel function is abnormal: reports digital impaction with removal but soft stool. Bladder function is abnormal: reports excessive urination with urge incontinence. Patient is sexually active. Contraception method is condoms. Postpartum depression screening: negative.  Tobacco, alcohol and substance abuse history reviewed.  Adult immunizations reviewed including TDAP, rubella and varicella.  The following portions of the patient's history were reviewed and updated as appropriate: allergies, current medications, past family history, past medical history, past social history, past surgical history and problem list.  Review of Systems Pertinent items noted in HPI and remainder of comprehensive ROS otherwise negative.   Objective:    BP 111/79   Pulse 88   Temp 97.8 F (36.6 C)   Wt 229 lb (103.9 kg)   Breastfeeding? Yes   BMI 39.93 kg/m   General:  alert, cooperative and no distress   Breasts:  inspection negative, no nipple discharge or bleeding, no masses or nodularity palpable  Lungs: clear to auscultation bilaterally  Heart:  regular rate and rhythm, S1, S2 normal, no murmur, click, rub or gallop  Abdomen: soft, non-tender; bowel sounds normal; no masses,  no organomegaly   Vulva:  normal  Vagina: normal vagina, no discharge, exudate, lesion, or erythema  Cervix:  no bleeding following Pap  Corpus: normal size, contour, position, consistency, mobility, non-tender  Adnexa:  normal adnexa  Rectal Exam: Not performed.   C-section scar: healed, no s/s infection.    50% of 25 min visit spent on counseling and coordination of care.  Assessment:     Normal 6-7 week postpartum exam. Pap smear done at today's visit.  Plan:    1. Contraception: condoms 2.  F/U asap for Paragard 3. Follow up in: 2 weeks for LARK or as needed.  2hr GTT for h/o GDM/screening for DM q 3 yrs per ADA recommendations Preconception counseling provided Healthy lifestyle practices reviewed

## 2015-12-25 LAB — NUSWAB VAGINITIS PLUS (VG+)
CANDIDA ALBICANS, NAA: NEGATIVE
CHLAMYDIA TRACHOMATIS, NAA: NEGATIVE
Candida glabrata, NAA: NEGATIVE
Neisseria gonorrhoeae, NAA: NEGATIVE
TRICH VAG BY NAA: NEGATIVE

## 2015-12-25 LAB — CYTOLOGY - PAP
DIAGNOSIS: NEGATIVE
HPV: NOT DETECTED

## 2016-01-07 ENCOUNTER — Ambulatory Visit: Payer: Self-pay | Admitting: Certified Nurse Midwife

## 2016-01-08 ENCOUNTER — Ambulatory Visit: Payer: Self-pay | Admitting: Certified Nurse Midwife

## 2016-01-10 ENCOUNTER — Encounter: Payer: Self-pay | Admitting: *Deleted

## 2016-01-10 ENCOUNTER — Other Ambulatory Visit: Payer: Self-pay

## 2016-01-10 ENCOUNTER — Ambulatory Visit (INDEPENDENT_AMBULATORY_CARE_PROVIDER_SITE_OTHER): Payer: Medicaid Other | Admitting: Certified Nurse Midwife

## 2016-01-10 VITALS — BP 111/73 | HR 91 | Wt 233.0 lb

## 2016-01-10 DIAGNOSIS — Z30430 Encounter for insertion of intrauterine contraceptive device: Secondary | ICD-10-CM | POA: Insufficient documentation

## 2016-01-10 NOTE — Progress Notes (Signed)
IUD Procedure Note   DIAGNOSIS: Desires long-term, reversible contraception   PROCEDURE: IUD placement Performing Provider: Kandis Cocking CNM  Patient counseled prior to procedure. I explained risks and benefits of Paragard IUD, reviewed alternative forms of contraception. Patient stated understanding and consented to continue with procedure.   LMP: unknown, postpartum Pregnancy Test: Negative Lot #: [id number] Expiration Date: Jan 2020   IUD type: [   ] Mirena   [X]  Paragard  [   ] Isla Pence   [   ]  Kyleena  PROCEDURE:  Timeout procedure was performed to ensure right patient and right site.  A bimanual exam was performed to determine the position of the uterus, retroverted. The speculum was placed. The vagina and cervix was sterilized in the usual manner and sterile technique was maintained throughout the course of the procedure. A single toothed tenaculum was applied to the posterior lip of the cervix and gentle traction applied. The first IUD was bent at the level of the cervix.  Device and inserter removed.  Attempted and successful with the second device.  The depth of the uterus was sounded to 10 cm. With gentle traction on the tenaculum, the IUD was inserted to the appropriate depth and inserted without difficulty.  The string was cut to an estimated 4 cm length. Bleeding was minimal. The patient tolerated the procedure well.   Follow up: The patient tolerated the procedure well without complications.  Standard post-procedure care is explained and return precautions are given.  Kandis Cocking CNM

## 2016-01-15 ENCOUNTER — Other Ambulatory Visit: Payer: Self-pay

## 2016-02-07 ENCOUNTER — Ambulatory Visit (INDEPENDENT_AMBULATORY_CARE_PROVIDER_SITE_OTHER): Payer: Medicaid Other | Admitting: Certified Nurse Midwife

## 2016-02-07 ENCOUNTER — Encounter: Payer: Self-pay | Admitting: Physician Assistant

## 2016-02-07 ENCOUNTER — Encounter: Payer: Self-pay | Admitting: Certified Nurse Midwife

## 2016-02-07 VITALS — BP 128/82 | HR 93 | Wt 241.0 lb

## 2016-02-07 DIAGNOSIS — K5901 Slow transit constipation: Secondary | ICD-10-CM | POA: Insufficient documentation

## 2016-02-07 DIAGNOSIS — Z30431 Encounter for routine checking of intrauterine contraceptive device: Secondary | ICD-10-CM

## 2016-02-07 DIAGNOSIS — K639 Disease of intestine, unspecified: Secondary | ICD-10-CM | POA: Diagnosis not present

## 2016-02-07 DIAGNOSIS — K5900 Constipation, unspecified: Secondary | ICD-10-CM | POA: Insufficient documentation

## 2016-02-07 MED ORDER — SENNOSIDES-DOCUSATE SODIUM 8.6-50 MG PO TABS: 2.0000 | ORAL_TABLET | ORAL | 1 refills | Status: DC

## 2016-02-07 NOTE — Progress Notes (Signed)
Subjective:    Kristen [name], Kristen [name] is a 35 y.o. female who presents for contraception counseling. The patient has no complaints today. The patient is sexually active. Pertinent past medical history: none.  Likes her Mirena IUD.  Is not currently bleeding with the IUD.  Reports using her finger vaginally to push out her stool in her rectum.    The information documented in the HPI was reviewed and verified.  Menstrual History: OB History    Gravida Para Term Preterm AB Living   1 1 1     1    SAB TAB Ectopic Multiple Live Births         0 1      No LMP recorded. has Mirena IUD   Patient Active Problem List   Diagnosis Date Noted  . Encounter for initial prescription of intrauterine contraceptive device (IUD) 11/30/2015  . Encounter for trial of labor 10/16/2015  . Supervision of normal first pregnancy in third trimester 09/13/2015  . Supervision of normal first pregnancy in first trimester 03/21/2015   No past medical history on file.  Past Surgical History:  Procedure Laterality Date  . CESAREAN SECTION N/A 10/17/2015   Procedure: CESAREAN SECTION;  Surgeon: Lavonia Drafts, MD;  Location: [facility];  Service: Obstetrics;  Laterality: N/A;     Current Outpatient Prescriptions:  .  Prenat w/o A-FeCbn-DSS-FA-DHA (CITRANATAL HARMONY) 30-1-260 MG CAPS, Take 1 tablet by mouth daily., Disp: 30 capsule, Rfl: 12 .  ferrous sulfate (FERROUSUL) 325 (65 FE) MG tablet, Take 1 tablet (325 mg total) by mouth 2 (two) times daily. (Patient not taking: Reported on 11/30/2015), Disp: 60 tablet, Rfl: 1 .  ibuprofen (ADVIL,MOTRIN) 800 MG tablet, Take 1 tablet (800 mg total) by mouth every 8 (eight) hours as needed. (Patient not taking: Reported on 01/11/2016), Disp: 60 tablet, Rfl: 1 .  oxyCODONE-acetaminophen (PERCOCET/ROXICET) 5-325 MG tablet, Take 1-2 tablets by mouth every 6 (six) hours as needed for moderate pain or severe pain. (Patient not taking: Reported on 11/30/2015), Disp: 30  tablet, Rfl: 0 Allergies  Allergen Reactions  . Kiwi Extract Itching  . Lime Flavor [Flavoring Agent] Itching    Social History  Substance Use Topics  . Smoking status: Never Smoker  . Smokeless tobacco: Never Used  . Alcohol use 0.0 oz/week     Comment: wine, occ    No family history on file.     Review of Systems Constitutional: negative for weight loss Genitourinary:negative for abnormal menstrual periods and vaginal discharge   Objective:   BP 115/77   Pulse 89   Wt 161 lb (73 kg)   BMI 25.22 kg/m    General:   alert  Skin:   no rash or abnormalities  Lungs:   clear to auscultation bilaterally  Heart:   regular rate and rhythm, S1, S2 normal, no murmur, click, rub or gallop  Breasts:   deferred  Abdomen:  normal findings: no organomegaly, soft, non-tender and no hernia  Pelvis:  External genitalia: normal general appearance Urinary system: urethral meatus normal and bladder without fullness, nontender Vaginal: normal without tenderness, induration or masses Cervix: normal appearance, IUD strings present Adnexa: normal bimanual exam Uterus: anteverted and non-tender, normal size Rectovaginal tissue is very thin, intact.     Lab Review Urine pregnancy test Labs reviewed yes Radiologic studies reviewed no  50% of 15 min visit spent on counseling and coordination of care.    Assessment:    35 y.o., continuing IUD, no contraindications.  Chronic constipation  Digital bowel evacuation.    IUD string check up  Plan:    All questions answered.  No orders of the defined types were placed in this encounter.  No orders of the defined types were placed in this encounter.  Need to obtain previous records Follow up as needed or in 1 year for annual exam.

## 2016-02-07 NOTE — Progress Notes (Signed)
Pt needs refill on stool softener.  Pt is having some constipation. ? referrral urology.

## 2016-02-15 ENCOUNTER — Ambulatory Visit (INDEPENDENT_AMBULATORY_CARE_PROVIDER_SITE_OTHER): Payer: Medicaid Other | Admitting: Physician Assistant

## 2016-02-15 ENCOUNTER — Encounter: Payer: Self-pay | Admitting: Physician Assistant

## 2016-02-15 VITALS — BP 116/86 | HR 80 | Ht 63.5 in | Wt 237.5 lb

## 2016-02-15 DIAGNOSIS — K5900 Constipation, unspecified: Secondary | ICD-10-CM | POA: Diagnosis not present

## 2016-02-15 DIAGNOSIS — N816 Rectocele: Secondary | ICD-10-CM | POA: Diagnosis not present

## 2016-02-15 MED ORDER — NA SULFATE-K SULFATE-MG SULF 17.5-3.13-1.6 GM/177ML PO SOLN: 1.0000 | ORAL | 0 refills | Status: AC

## 2016-02-15 NOTE — Progress Notes (Addendum)
Chief Complaint: Constipation  HPI:  Kristen [name] is a 35 year old African-American female with a past medical history of alpha thalassemia, asthma and endometriosis, who was referred to me by Morene Crocker, CNM for a complaint of Constipation .     Today, the patient tells me that she has had inconsistent bowel movements for the past 5 years radiating towards constipation. Over the past year she has started to notice that she has to digitally manipulate her vaginal wall in order to have a bowel movement. She describes that when she has stool she will feel a bulge in the posterior wall of her vagina and sometimes in her perineum and has to insert her finger into her vagina to push on this area in order to be able to relieve herself. This was occasional over the past few years, but has gotten to the point where she is doing this on a daily basis over the past year. Patient tells me when she does have a bowel movement that is solid and soft and "looks normal". Patient does use 2 Senokot tabs on a daily basis in order to achieve this. This seems to be working well for her.   Patient also describes some urge urinary incontinence. She does have history of having 5 children, 4 vaginally and 1 by C-section. Per her primary care physician, they did recommend referral to urologist.   Patient's family history is positive for colon cancer in her uncle in his 67s.   Patient denies fever, chills, blood in her stool, melena, weight loss, fatigue, nausea, vomiting, heartburn reflux or abdominal pain.  Past Medical History:  Diagnosis Date  . Alpha thalassemia ([place])   . Asthma    rescue inhaler- last use May 2017  . Complication of anesthesia   . Endometriosis   . Gestational diabetes    metformin and insulin  . Preterm labor with preterm delivery     Past Surgical History:  Procedure Laterality Date  . CESAREAN SECTION    . CESAREAN SECTION N/A 10/29/2015   Procedure: CESAREAN SECTION;   Surgeon: Woodroe Mode, MD;  Location: [facility];  Service: Obstetrics;  Laterality: N/A;  . DILATION AND CURETTAGE OF UTERUS      Current Outpatient Prescriptions  Medication Sig Dispense Refill  . albuterol (PROVENTIL HFA;VENTOLIN HFA) 108 (90 BASE) MCG/ACT inhaler Inhale 2 puffs into the lungs every 6 (six) hours as needed for wheezing or shortness of breath. Reported on 05/02/2015    . mometasone (NASONEX) 50 MCG/ACT nasal spray Place 2 sprays into the nose daily. 17 g 12  . Prenat-FeFmCb-DSS-FA-DHA w/o A (CITRANATAL HARMONY) 27-1-260 MG CAPS Take 1 capsule by mouth daily. (Patient taking differently: Take 1 capsule by mouth at bedtime. ) 30 capsule 11  . senna-docusate (SENOKOT-S) 8.6-50 MG tablet Take 2 tablets by mouth daily. 90 tablet 1  . QUEtiapine (SEROQUEL) 25 MG tablet Take 25 mg by mouth at bedtime.     No current facility-administered medications for this visit.     Allergies as of 02/15/2016 - Review Complete 02/15/2016  Allergen Reaction Noted  . Amoxicillin Hives and Shortness Of Breath 04/22/2013  . Doxycycline Hives and Shortness Of Breath 05/11/2015  . Percocet [oxycodone-acetaminophen] Anaphylaxis 07/20/2013  . Nerve block tray Itching and Other (See Comments) 03/07/2011  . Penicillins Hives 04/22/2013    Family History  Problem Relation Age of Onset  . HIV Father   . Diabetes Maternal Grandmother   . Diabetes Paternal Grandmother   .  Colon cancer Maternal Uncle   . Stomach cancer Neg Hx   . Rectal cancer Neg Hx   . Esophageal cancer Neg Hx   . Liver cancer Neg Hx     Social History   Social History  . Marital status: Married    Spouse name: N/A  . Number of children: 5  . Years of education: N/A   Occupational History  . [employer]    Social History Main Topics  . Smoking status: Former Research scientist (life sciences)  . Smokeless tobacco: Never Used  . Alcohol use No  . Drug use: No  . Sexual activity: Yes    Partners: Male    Birth control/ protection: Condom     Other Topics Concern  . Not on file   Social History Narrative  . No narrative on file    Review of Systems:    Constitutional: No weight loss, fever, chills, weakness or fatigue Skin: No rash or itching Cardiovascular: No chest pain  Respiratory: No SOB  Gastrointestinal: See HPI and otherwise negative Genitourinary: Positive for urge incontinence  Neurological: No headache Musculoskeletal: No new muscle or joint pain Hematologic: No bleeding  Psychiatric: No history of depression or anxiety   Physical Exam:  Vital signs: BP 116/86   Pulse 80   Ht 5' 3.5" (1.613 m)   Wt 237 lb 8 oz (107.7 kg)   BMI 41.41 kg/m    Constitutional:   Pleasant African American female appears to be in NAD, Well developed, Well nourished, alert and cooperative Head:  Normocephalic and atraumatic. Eyes:   PEERL, EOMI. No icterus. Conjunctiva pink. Ears:  Normal auditory acuity. Neck:  Supple Throat: Oral cavity and pharynx without inflammation, swelling or lesion.  Respiratory: Respirations even and unlabored. Lungs clear to auscultation bilaterally.   No wheezes, crackles, or rhonchi.  Cardiovascular: Normal S1, S2. No MRG. Regular rate and rhythm. No peripheral edema, cyanosis or pallor.  Gastrointestinal:  Soft, nondistended, nontender. No rebound or guarding. Normal bowel sounds. No appreciable masses or hepatomegaly. Rectal:  Not performed.  Msk:  Symmetrical without gross deformities. Without edema, no deformity or joint abnormality.  Neurologic:  Alert and  oriented x4;  grossly normal neurologically.  Skin:   Dry and intact without significant lesions or rashes. Psychiatric: Demonstrates good judgement and reason without abnormal affect or behaviors.  MOST RECENT LABS:  CBC    Component Value Date/Time   WBC 13.1 (H) 10/30/2015 0527   RBC 4.23 10/30/2015 0527   HGB 8.9 (L) 10/30/2015 0527   HCT 27.5 (L) 10/30/2015 0527   PLT 208 10/30/2015 0527   MCV 65.0 (L) 10/30/2015 0527    MCH 21.0 (L) 10/30/2015 0527   MCHC 32.4 10/30/2015 0527   RDW 16.8 (H) 10/30/2015 0527   LYMPHSABS 3.1 05/02/2015 1450   MONOABS 1.2 (H) 05/02/2015 1450   EOSABS 0.0 05/02/2015 1450   BASOSABS 0.0 05/02/2015 1450    CMP     Component Value Date/Time   NA 139 04/02/2015 1252   K 4.0 04/02/2015 1252   CL 103 04/02/2015 1252   CO2 27 04/02/2015 1252   GLUCOSE 78 04/02/2015 1252   BUN 10 04/02/2015 1252   CREATININE 0.81 04/02/2015 1252   CALCIUM 9.5 04/02/2015 1252   PROT 7.3 04/02/2015 1252   ALBUMIN 3.3 (L) 04/02/2015 1252   AST 28 04/02/2015 1252   ALT 25 04/02/2015 1252   ALKPHOS 89 04/02/2015 1252   BILITOT 0.5 04/02/2015 1252   GFRNONAA >60 04/02/2015 1252  GFRAA >60 04/02/2015 1252    Assessment: 1. Constipation: She describes constipation currently resolved on Senokot 2 tabs per day, but she does have to digitally manipulate the posterior wall of her vagina in order to pass a bowel movement; Likely a rectocele which is complicating her constipation 2. Rectocele: By history as above  Plan: 1. Scheduled patient for a colonoscopy for further evaluation. Discussed risks, benefits, limitations and alternatives the patient agrees to proceed. This rescheduled Dr. Hilarie Fredrickson in [facility]. 2. Discussed with patient that she should continue her Senokot as this seems to be helping her have daily soft solid formed bowel movements, though may want to discuss something different in the future that is not a stimulant 3. Discussed with the patient that she may need a surgical referral after further evaluation with colonoscopy for what sounds like her rectocele 4. Patient to follow in clinic per Dr. Vena Rua recommendations  Ellouise Newer, PA-C [facility] 02/15/2016, 1:48 PM  Cc: Morene Crocker, CNM   Addendum: Reviewed and agree with initial management. Jerene Bears, MD

## 2016-02-15 NOTE — Patient Instructions (Signed)

## 2016-03-19 ENCOUNTER — Encounter: Payer: Self-pay | Admitting: Internal Medicine

## 2016-03-19 ENCOUNTER — Ambulatory Visit (AMBULATORY_SURGERY_CENTER): Payer: Medicaid Other | Admitting: Internal Medicine

## 2016-03-19 VITALS — BP 118/79 | HR 82 | Temp 98.9°F | Resp 21 | Ht 63.0 in | Wt 237.0 lb

## 2016-03-19 DIAGNOSIS — D125 Benign neoplasm of sigmoid colon: Secondary | ICD-10-CM | POA: Diagnosis not present

## 2016-03-19 DIAGNOSIS — K5900 Constipation, unspecified: Secondary | ICD-10-CM

## 2016-03-19 DIAGNOSIS — K635 Polyp of colon: Secondary | ICD-10-CM

## 2016-03-19 MED ORDER — LINACLOTIDE 145 MCG PO CAPS: 145.0000 ug | ORAL_CAPSULE | Freq: Every day | ORAL | 3 refills | Status: DC

## 2016-03-19 MED ORDER — SODIUM CHLORIDE 0.9 % IV SOLN: 500.0000 mL | INTRAVENOUS | Status: DC

## 2016-03-19 NOTE — Progress Notes (Signed)
Called to room to assist during endoscopic procedure.  Patient ID and intended procedure confirmed with present staff. Received instructions for my participation in the procedure from the performing physician.  

## 2016-03-19 NOTE — Progress Notes (Signed)
Patient ready for dc at 1634. Husbandand three children at bedside. Husband feeding their baby . Will wait discharge until feeding complete.

## 2016-03-19 NOTE — Patient Instructions (Signed)
  You will discontinue your Senna and replace with new medication, Linzess. [facility].  Return to your GYN doctor for evaluation of Rectocele.  Return to Dr. Vena Rua office    YOU HAD AN ENDOSCOPIC PROCEDURE TODAY AT THE [facility]:   Refer to the procedure report that was given to you for any specific questions about what was found during the examination.  If the procedure report does not answer your questions, please call your gastroenterologist to clarify.  If you requested that your care partner not be given the details of your procedure findings, then the procedure report has been included in a sealed envelope for you to review at your convenience later.  YOU SHOULD EXPECT: Some feelings of bloating in the abdomen. Passage of more gas than usual.  Walking can help get rid of the air that was put into your GI tract during the procedure and reduce the bloating. If you had a lower endoscopy (such as a colonoscopy or flexible sigmoidoscopy) you may notice spotting of blood in your stool or on the toilet paper. If you underwent a bowel prep for your procedure, you may not have a normal bowel movement for a few days.  Please Note:  You might notice some irritation and congestion in your nose or some drainage.  This is from the oxygen used during your procedure.  There is no need for concern and it should clear up in a day or so.  SYMPTOMS TO REPORT IMMEDIATELY:   Following lower endoscopy (colonoscopy or flexible sigmoidoscopy):  Excessive amounts of blood in the stool  Significant tenderness or worsening of abdominal pains  Swelling of the abdomen that is new, acute  Fever of 100F or higher   For urgent or emergent issues, a gastroenterologist can be reached at any hour by calling [phone].   DIET:  We do recommend a small meal at first, but then you may proceed to your regular diet.  Drink plenty of fluids but you should avoid  alcoholic beverages for 24 hours.  ACTIVITY:  You should plan to take it easy for the rest of today and you should NOT DRIVE or use heavy machinery until tomorrow (because of the sedation medicines used during the test).    FOLLOW UP: Our staff will call the number listed on your records the next business day following your procedure to check on you and address any questions or concerns that you may have regarding the information given to you following your procedure. If we do not reach you, we will leave a message.  However, if you are feeling well and you are not experiencing any problems, there is no need to return our call.  We will assume that you have returned to your regular daily activities without incident.  If any biopsies were taken you will be contacted by phone or by letter within the next 1-3 weeks.  Please call us at [phone] if you have not heard about the biopsies in 3 weeks.    SIGNATURES/CONFIDENTIALITY: You and/or your care partner have signed paperwork which will be entered into your electronic medical record.  These signatures attest to the fact that that the information above on your After Visit Summary has been reviewed and is understood.  Full responsibility of the confidentiality of this discharge information lies with you and/or your care-partner.

## 2016-03-19 NOTE — Op Note (Signed)
[facility] Patient Name: Kristen [name] Procedure Date: 03/19/2016 3:30 PM MRN: [id number] Endoscopist: Jerene Bears , MD Age: 36 Referring MD:  Date of Birth: 01-07-1981 Gender: Female Account #: [id number] Procedure:                Colonoscopy Indications:              Constipation, defecatory dysfunction Medicines:                Monitored Anesthesia Care Procedure:                Pre-Anesthesia Assessment:                           - Prior to the procedure, a History and Physical                            was performed, and patient medications and                            allergies were reviewed. The patient's tolerance of                            previous anesthesia was also reviewed. The risks                            and benefits of the procedure and the sedation                            options and risks were discussed with the patient.                            All questions were answered, and informed consent                            was obtained. Prior Anticoagulants: The patient has                            taken no previous anticoagulant or antiplatelet                            agents. ASA Grade Assessment: II - A patient with                            mild systemic disease. After reviewing the risks                            and benefits, the patient was deemed in                            satisfactory condition to undergo the procedure.                           After obtaining informed consent, the colonoscope  was passed under direct vision. Throughout the                            procedure, the patient's blood pressure, pulse, and                            oxygen saturations were monitored continuously. The                            Model PCF-H190L [id number]) scope was introduced                            through the anus and advanced to the the cecum,                            identified by  appendiceal orifice and ileocecal                            valve. The colonoscopy was performed without                            difficulty. The patient tolerated the procedure                            well. The quality of the bowel preparation was                            good. The ileocecal valve, appendiceal orifice, and                            rectum were photographed. Scope In: 3:49:26 PM Scope Out: 3:59:55 PM Scope Withdrawal Time: 0 hours 8 minutes 2 seconds  Total Procedure Duration: 0 hours 10 minutes 29 seconds  Findings:                 The perianal and digital rectal examinations were                            normal.                           A 5 mm polyp was found in the sigmoid colon. The                            polyp was sessile. The polyp was removed with a                            cold snare. Resection and retrieval were complete.                           The exam was otherwise without abnormality on                            direct and retroflexion views. Complications:  No immediate complications. Estimated Blood Loss:     Estimated blood loss was minimal. Impression:               - One 5 mm polyp in the sigmoid colon, removed with                            a cold snare. Resected and retrieved.                           - The examination was otherwise normal on direct                            and retroflexion views. Recommendation:           - Patient has a contact number available for                            emergencies. The signs and symptoms of potential                            delayed complications were discussed with the                            patient. Return to normal activities tomorrow.                            Written discharge instructions were provided to the                            patient.                           - Resume previous diet.                           - Continue present medications.                            - Await pathology results.                           - Repeat colonoscopy is recommended. The                            colonoscopy date will be determined after pathology                            results from today's exam become available for                            review.                           - Change senna to Linzess 145 mcg daily to see if  this helps with more with defecation dysfunction.                           - Return to GYN to for further evaluation of                            possible rectocele. If bimanual examination is                            inconclusive this can be evaluated further with                            consideration of defecography, anorectal manometry                            and discussion with colorectal surgery.                           - Office follow-up with me next available. Jerene Bears, MD 03/19/2016 4:08:22 PM This report has been signed electronically.

## 2016-03-19 NOTE — Progress Notes (Signed)
Report to PACU, RN, vss, BBS= Clear.  

## 2016-03-20 ENCOUNTER — Telehealth: Payer: Self-pay

## 2016-03-20 ENCOUNTER — Encounter: Payer: Self-pay | Admitting: Obstetrics

## 2016-03-20 NOTE — Telephone Encounter (Signed)
  Follow up Call-  Call back number 03/19/2016  Post procedure Call Back phone  # [phone]  Permission to leave phone message Yes  Some recent data might be hidden     Patient questions:  Do you have a fever, pain , or abdominal swelling? No. Pain Score  0 *  Have you tolerated food without any problems? Yes.    Have you been able to return to your normal activities? Yes.    Do you have any questions about your discharge instructions: Diet   No. Medications  No. Follow up visit  No.  Do you have questions or concerns about your Care? No.  Actions: * If pain score is 4 or above: No action needed, pain <4.

## 2016-03-24 ENCOUNTER — Encounter: Payer: Self-pay | Admitting: *Deleted

## 2016-03-25 ENCOUNTER — Encounter: Payer: Self-pay | Admitting: Internal Medicine

## 2016-03-27 ENCOUNTER — Other Ambulatory Visit: Payer: Self-pay | Admitting: Obstetrics

## 2016-05-02 ENCOUNTER — Encounter: Payer: Self-pay | Admitting: *Deleted

## 2016-05-12 ENCOUNTER — Telehealth: Payer: Self-pay | Admitting: *Deleted

## 2016-05-12 ENCOUNTER — Ambulatory Visit: Payer: Medicaid Other | Admitting: Internal Medicine

## 2016-05-12 NOTE — Telephone Encounter (Signed)
No show letter mailed to patient. 

## 2016-08-14 IMAGING — US US MFM OB DETAIL+14 WK
1 series · 14 of 28 positions shown · non-contrast
Comparison: none

[Series 1: us mfm ob detail+14 wk · 71 acquisitions, 14 frames shown]
[im 3/71]
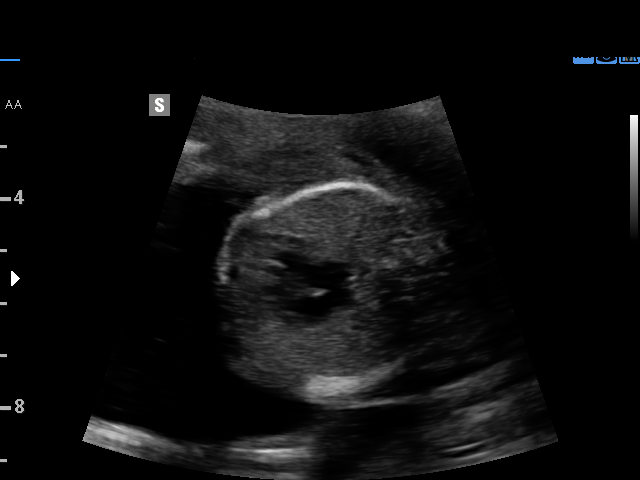
[im 8/71]
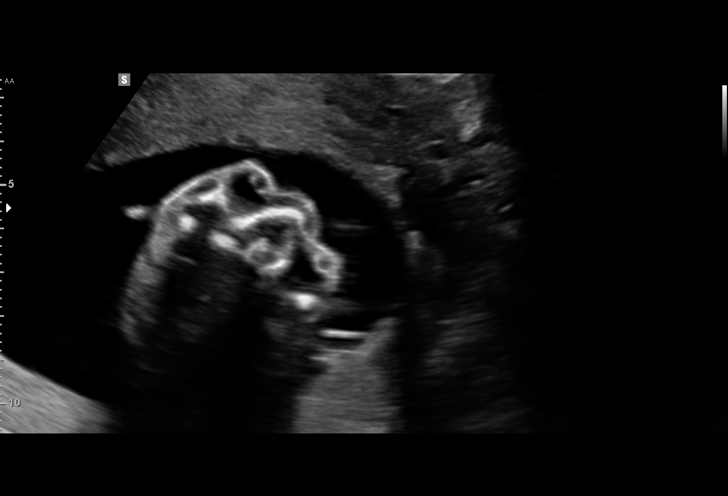
[im 13/71]
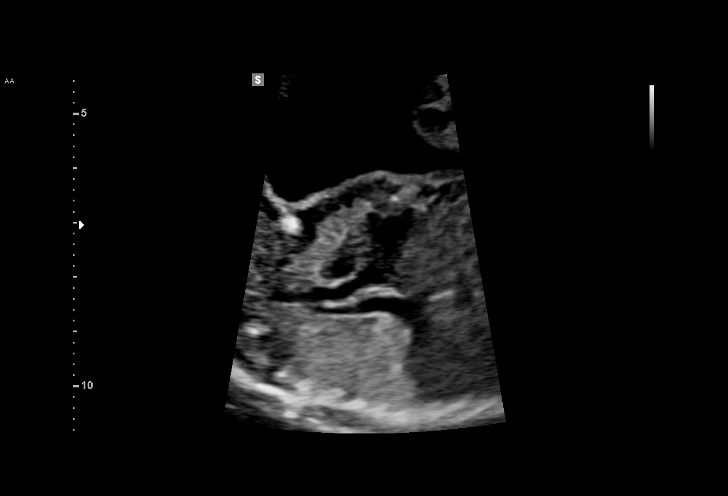
[im 19/71]
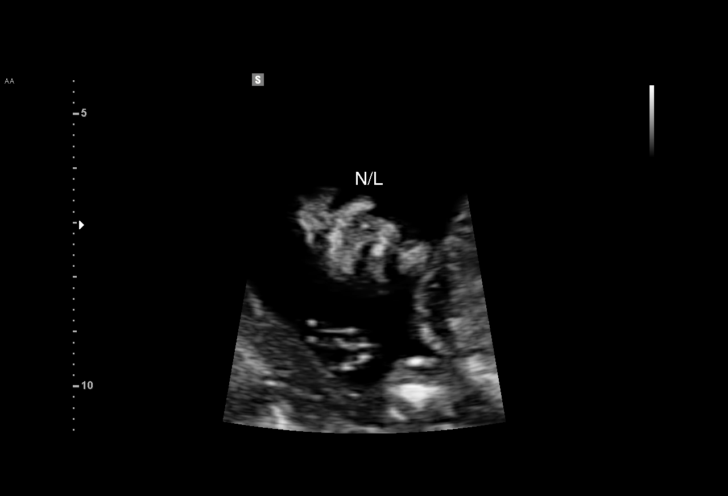
[im 24/71]
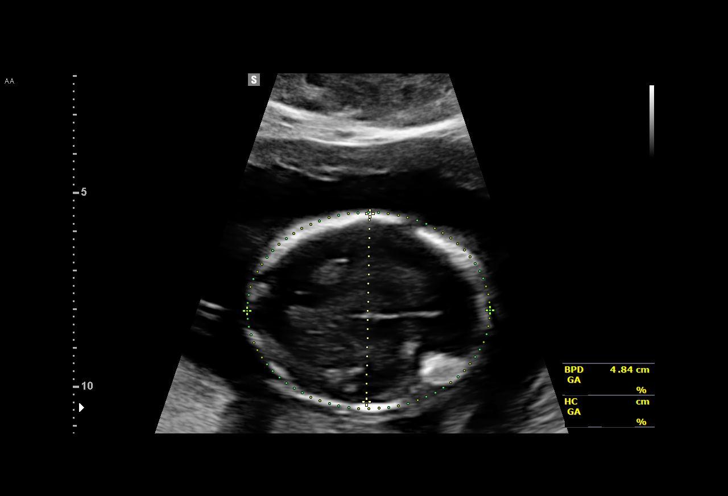
[im 29/71]
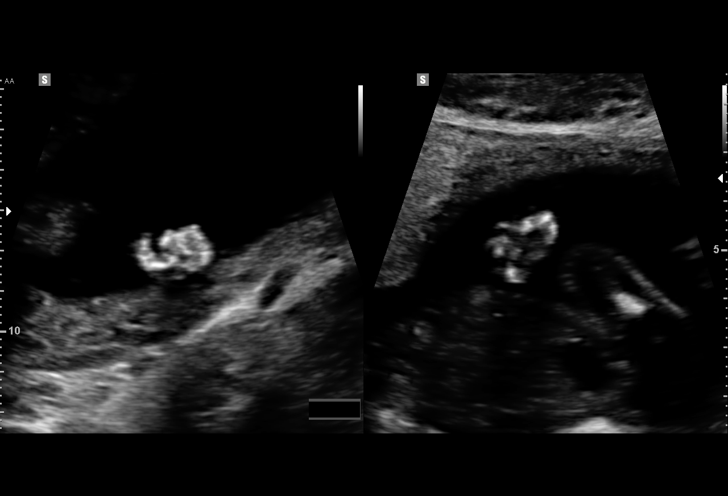
[im 34/71]
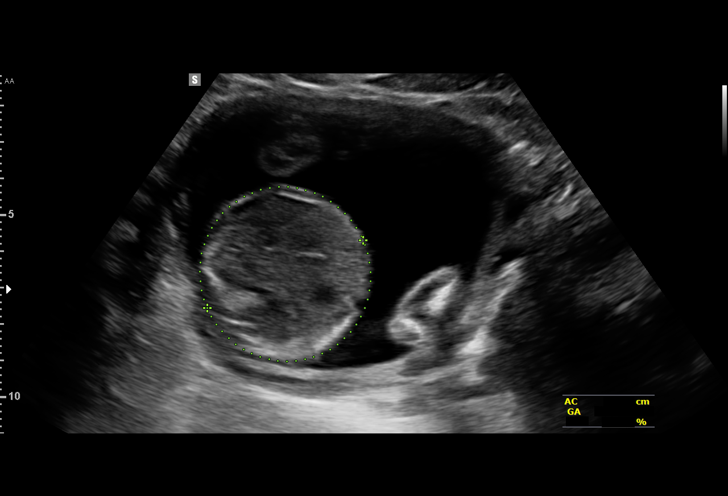
[im 39/71]
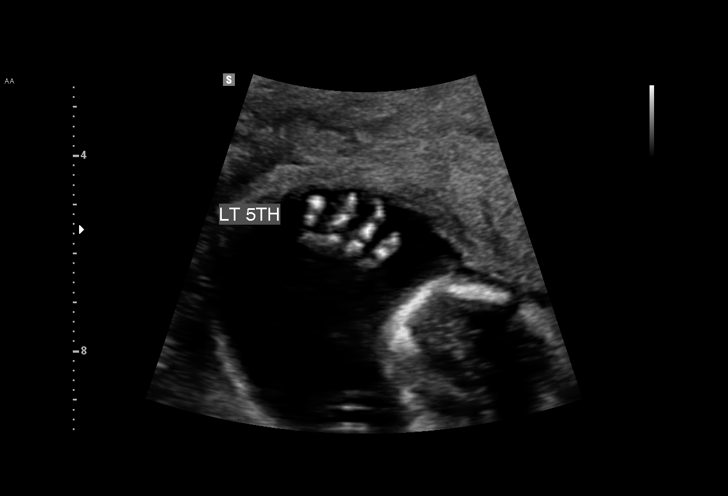
[im 45/71]
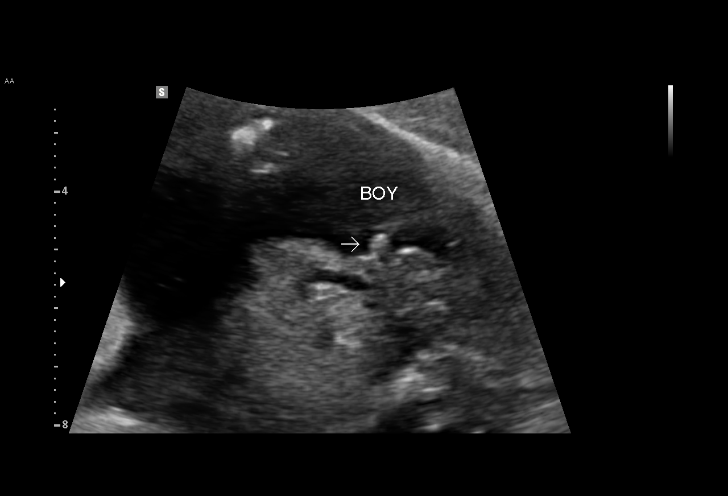
[im 50/71]
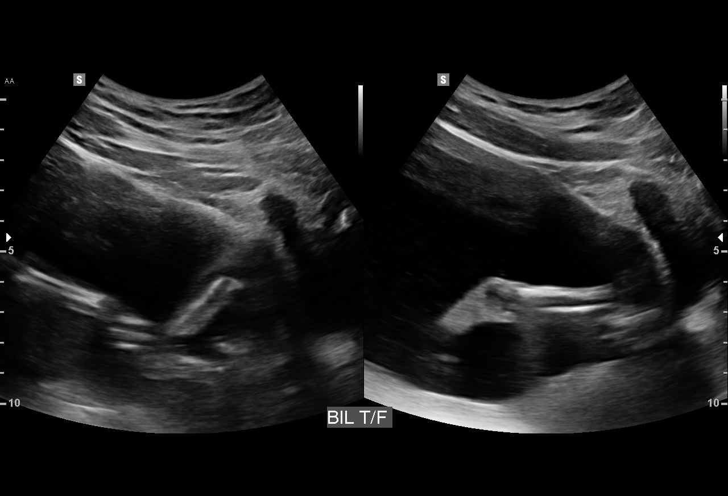
[im 55/71]
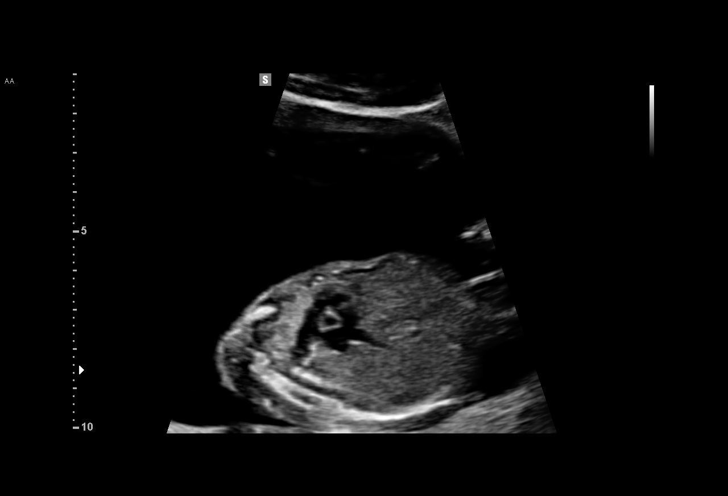
[im 60/71]
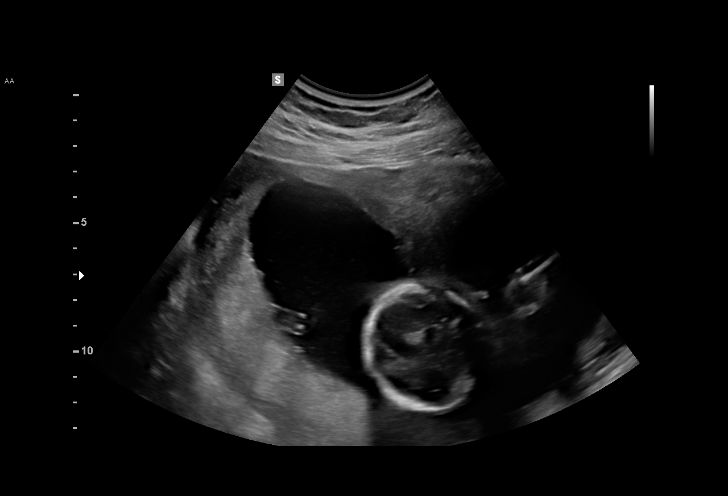
[im 65/71]
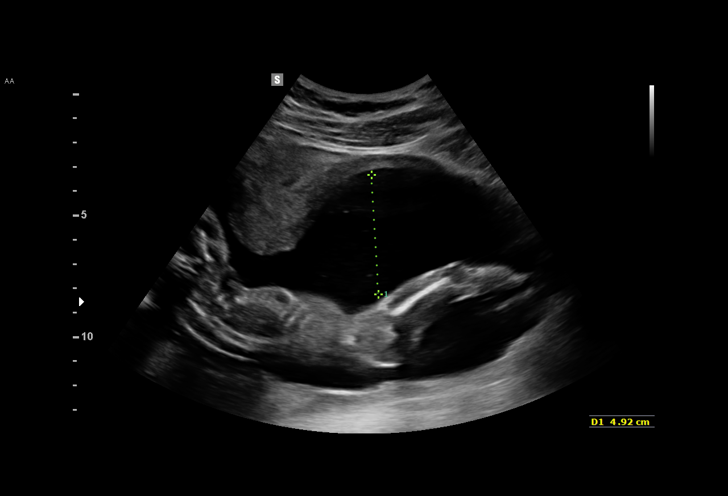
[im 71/71]
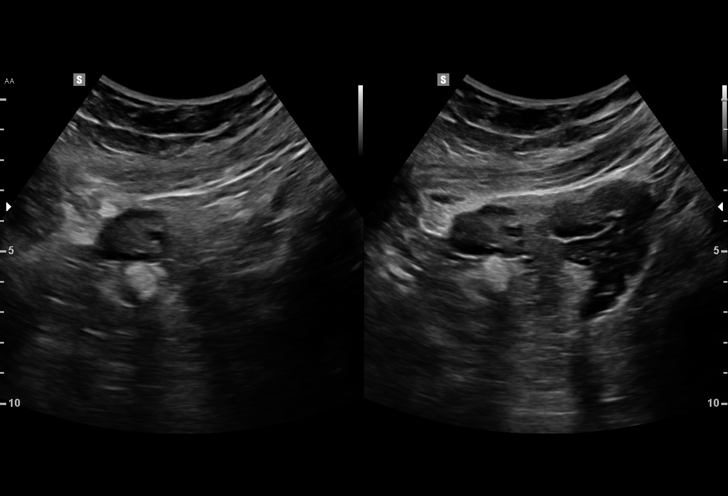

[14 of 28 positions shown; findings below may reference images not displayed]

1  LUIS A SCHWEIZER           [id number]      [id number]     [id number]
Indications

20 weeks gestation of pregnancy
Detailed fetal anatomic survey                 Z36
Obesity complicating pregnancy, second
trimester
Poor obstetric history: Previous preterm
delivery (twins, 18 weeks then 24 weeks; 16
weeks; 34 weeks; 18 weeks); 17P
Previous cesarean delivery, antepartum
OB History

Blood Type:            Height:  5'4"   Weight (lb):  251       BMI:
Gravidity:    12        Term:   2        Prem:   2         SAB:   8
Living:       4
Fetal Evaluation

Num Of Fetuses:     1
Fetal Heart         132
Rate(bpm):
Cardiac Activity:   Observed
Presentation:       Breech
Placenta:           Posterior, above cervical os
P. Cord Insertion:  Visualized

Amniotic Fluid
AFI FV:      Subjectively within normal limits
Biometry

BPD:      47.4  mm     G. Age:  20w 2d         39  %    CI:         74.51  %    70 - 86
FL/HC:       18.3  %    15.9 -
HC:      174.3  mm     G. Age:  20w 0d         18  %    HC/AC:       1.19       1.06 -
AC:      146.2  mm     G. Age:  19w 6d         24  %    FL/BPD:      67.3  %
FL:       31.9  mm     G. Age:  20w 0d         21  %    FL/AC:       21.8  %    20 - 24
CER:      19.9  mm     G. Age:  19w 0d         12  %
NFT:       3.9  mm
CM:        4.3  mm

Est. FW:     322   gm    0 lb 11 oz     33  %
Gestational Age

LMP:           20w 4d        Date:  02/04/15                 EDD:    11/11/15
U/S Today:     20w 0d                                        EDD:    11/15/15
Best:          20w 4d     Det. By:  LMP  (02/04/15)          EDD:    11/11/15
Anatomy

Cranium:               Appears normal         LVOT:                   Appears normal
Cavum:                 Appears normal         Aortic Arch:            Appears normal
Ventricles:            Appears normal         Ductal Arch:            Appears normal
Choroid Plexus:        Appears normal         Diaphragm:              Appears normal
Cerebellum:            Appears normal         Stomach:                Appears normal, left
sided
Posterior Fossa:       Appears normal         Abdomen:                Appears normal
Nuchal Fold:           Appears normal         Abdominal Wall:         Appears nml (cord
insert, abd wall)
Face:                  Appears normal         Cord Vessels:           Appears normal (3
(orbits and profile)                           vessel cord)
Lips:                  Appears normal         Kidneys:                Appear normal
Palate:                Appears normal         Bladder:                Appears normal
Thoracic:              Appears normal         Spine:                  Not well visualized
Heart:                 Appears normal         Upper Extremities:      Appears normal
(4CH, axis, and
situs)
RVOT:                  Appears normal         Lower Extremities:      Appears normal

Other:  Male gender. Heels and 5th digit visualized. Technically difficult due
to fetal position.
Cervix Uterus Adnexa

Cervix
Length:            4.1  cm.
Normal appearance by transabdominal scan.

Left Ovary
Within normal limits.

Right Ovary
Within normal limits.
Impression

SIUP at 20+4 weeks
Normal detailed fetal anatomy; limited views of spine
Normal amniotic fluid volume
Measurements consistent with LMP dating
TA views of cervix: normal length without funneling
Recommendations

Consider CLs at 22 and 24 weeks, then follow-up ultrasounds
as clinically indicated.

## 2016-09-03 ENCOUNTER — Ambulatory Visit (INDEPENDENT_AMBULATORY_CARE_PROVIDER_SITE_OTHER): Payer: Medicaid Other | Admitting: Obstetrics

## 2016-09-03 ENCOUNTER — Encounter: Payer: Self-pay | Admitting: Obstetrics

## 2016-09-03 VITALS — BP 136/80 | HR 77 | Ht 63.5 in | Wt 245.5 lb

## 2016-09-03 DIAGNOSIS — Z30432 Encounter for removal of intrauterine contraceptive device: Secondary | ICD-10-CM

## 2016-09-03 DIAGNOSIS — Z3009 Encounter for other general counseling and advice on contraception: Secondary | ICD-10-CM | POA: Diagnosis not present

## 2016-09-03 DIAGNOSIS — Z30013 Encounter for initial prescription of injectable contraceptive: Secondary | ICD-10-CM

## 2016-09-03 MED ORDER — MEDROXYPROGESTERONE ACETATE 150 MG/ML IM SUSP: 150.0000 mg | INTRAMUSCULAR | 0 refills | Status: DC

## 2016-09-03 NOTE — Addendum Note (Signed)
Addended by: Baltazar Najjar A on: 09/03/2016 10:42 AM   Modules accepted: Orders

## 2016-09-03 NOTE — Progress Notes (Signed)
Patient is in the office to discuss [facility] options. Pt states that IUD is uncomfortable with intercourse and she dislikes the fact that she has been spotting frequently.

## 2016-09-03 NOTE — Progress Notes (Signed)
    GYNECOLOGY OFFICE PROCEDURE NOTE  Kristen [name] is a 36 y.o. [id number] here for ParaGuard IUD removal because of discomfort with intercourse and constant spotting. No GYN concerns.  Last pap smear was on 2017 and was normal.  IUD Removal  Patient identified, informed consent performed, consent signed.  Patient was in the dorsal lithotomy position, normal external genitalia was noted.  A speculum was placed in the patient's vagina, normal discharge was noted, no lesions. The cervix was visualized., no lesions, no abnormal discharge.  The strings of the IUD were grasped and pulled using ring forceps. The IUD was removed in its entirety.   Patient tolerated the procedure well.    Patient will use Depo Provera for contraception for now, and plans to schedule tubal sterilization.  Routine preventative health maintenance measures emphasized.   Shelly Bombard, MD, [place] Attending [place], [facility], [place]

## 2016-09-17 ENCOUNTER — Encounter: Payer: Self-pay | Admitting: Obstetrics and Gynecology

## 2016-09-17 ENCOUNTER — Ambulatory Visit (INDEPENDENT_AMBULATORY_CARE_PROVIDER_SITE_OTHER): Payer: Medicaid Other | Admitting: Obstetrics and Gynecology

## 2016-09-17 ENCOUNTER — Encounter (HOSPITAL_COMMUNITY): Payer: Self-pay

## 2016-09-17 DIAGNOSIS — Z3009 Encounter for other general counseling and advice on contraception: Secondary | ICD-10-CM

## 2016-09-17 NOTE — Patient Instructions (Signed)
Laparoscopic Tubal Ligation Laparoscopic tubal ligation is a procedure to close the fallopian tubes. This is done so that you cannot get pregnant. When the fallopian tubes are closed, the eggs that your ovaries release cannot enter the uterus, and sperm cannot reach the released eggs. A laparoscopic tubal ligation is sometimes called "getting your tubes tied." You should not have this procedure if you want to get pregnant someday or if you are unsure about having more children. Tell a health care provider about:  Any allergies you have.  All medicines you are taking, including vitamins, herbs, eye drops, creams, and over-the-counter medicines.  Any problems you or family members have had with anesthetic medicines.  Any blood disorders you have.  Any surgeries you have had.  Any medical conditions you have.  Whether you are pregnant or may be pregnant.  Any past pregnancies. What are the risks? Generally, this is a safe procedure. However, problems may occur, including:  Infection.  Bleeding.  Injury to surrounding organs.  Side effects from anesthetics.  Failure of the procedure.  This procedure can increase your risk of a kind of pregnancy in which a fertilized egg attaches to the outside of the uterus (ectopic pregnancy). What happens before the procedure?  Ask your health care provider about: ? Changing or stopping your regular medicines. This is especially important if you are taking diabetes medicines or blood thinners. ? Taking medicines such as aspirin and ibuprofen. These medicines can thin your blood. Do not take these medicines before your procedure if your health care provider instructs you not to.  Follow instructions from your health care provider about eating and drinking restrictions.  Plan to have someone take you home after the procedure.  If you go home right after the procedure, plan to have someone with you for 24 hours. What happens during the  procedure?  You will be given one or more of the following: ? A medicine to help you relax (sedative). ? A medicine to numb the area (local anesthetic). ? A medicine to make you fall asleep (general anesthetic). ? A medicine that is injected into an area of your body to numb everything below the injection site (regional anesthetic).  An IV tube will be inserted into one of your veins. It will be used to give you medicines and fluids during the procedure.  Your bladder may be emptied with a small tube (catheter).  If you have been given a general anesthetic, a tube will be put down your throat to help you breathe.  Two small cuts (incisions) will be made in your lower abdomen and near your belly button.  Your abdomen will be inflated with a gas. This will let the surgeon see better and will give the surgeon room to work.  A thin, lighted tube (laparoscope) with a camera attached will be inserted into your abdomen through one of the incisions. Small instruments will be inserted through the other incision.  The fallopian tubes will be tied off, burned (cauterized), or blocked with a clip, ring, or clamp. A small portion in the center of each fallopian tube may be removed.  The gas will be released from the abdomen.  The incisions will be closed with stitches (sutures).  A bandage (dressing) will be placed over the incisions. The procedure may vary among health care providers and hospitals. What happens after the procedure?  Your blood pressure, heart rate, breathing rate, and blood oxygen level will be monitored often until the medicines you   were given have worn off.  You will be given medicine to help with pain, nausea, and vomiting as needed. This information is not intended to replace advice given to you by your health care provider. Make sure you discuss any questions you have with your health care provider. Document Released: 05/19/2000 Document Revised: 07/19/2015 Document  Reviewed: 01/21/2015 Elsevier Interactive Patient Education  2018 [employer]vier [employer].  

## 2016-09-17 NOTE — Progress Notes (Signed)
Ms Kristen [name] present for eval for Lap BTL She is [id number]. Has tried hormonal contraception in the past but did not tolerate.  Recently had an IUD removed. Currently using condoms. Breast feeding H/O c section x 2 Last pap 10/17 normal  PE AF VSS Lungs clear Heart RRR Abd soft + BS obese  A/P Unwanted fertility  Lap BTL reviewed. R.B/failure rate/post op care. Tubal papers signed today. Continue with condoms. Will schedule in 30 days. F/U with post op appt.

## 2016-09-18 ENCOUNTER — Ambulatory Visit: Payer: Medicaid Other | Admitting: Obstetrics and Gynecology

## 2016-09-22 ENCOUNTER — Encounter (HOSPITAL_COMMUNITY): Payer: Self-pay

## 2016-09-23 ENCOUNTER — Encounter: Payer: Self-pay | Admitting: Neurology

## 2016-09-24 ENCOUNTER — Institutional Professional Consult (permissible substitution): Payer: Self-pay | Admitting: Neurology

## 2016-09-25 ENCOUNTER — Encounter: Payer: Self-pay | Admitting: Neurology

## 2016-10-02 ENCOUNTER — Emergency Department (HOSPITAL_COMMUNITY)
Admission: EM | Admit: 2016-10-02 | Discharge: 2016-10-02 | Disposition: A | Discharge status: Home / Self Care | Payer: Medicaid Other | Attending: Emergency Medicine | Admitting: Emergency Medicine

## 2016-10-02 ENCOUNTER — Encounter (HOSPITAL_COMMUNITY): Payer: Self-pay | Admitting: Emergency Medicine

## 2016-10-02 DIAGNOSIS — Z5321 Procedure and treatment not carried out due to patient leaving prior to being seen by health care provider: Secondary | ICD-10-CM | POA: Insufficient documentation

## 2016-10-02 DIAGNOSIS — R21 Rash and other nonspecific skin eruption: Secondary | ICD-10-CM | POA: Diagnosis present

## 2016-10-02 NOTE — ED Triage Notes (Signed)
Pt c/o rash to LLE that she first noticed last night. Denies using new hygiene products/detergents.

## 2016-10-03 ENCOUNTER — Emergency Department (HOSPITAL_COMMUNITY)
Admission: EM | Admit: 2016-10-03 | Discharge: 2016-10-03 | Disposition: A | Discharge status: Home / Self Care | Payer: Medicaid Other | Attending: Emergency Medicine | Admitting: Emergency Medicine

## 2016-10-03 ENCOUNTER — Encounter (HOSPITAL_COMMUNITY): Payer: Self-pay | Admitting: Emergency Medicine

## 2016-10-03 DIAGNOSIS — Y929 Unspecified place or not applicable: Secondary | ICD-10-CM | POA: Diagnosis not present

## 2016-10-03 DIAGNOSIS — R21 Rash and other nonspecific skin eruption: Secondary | ICD-10-CM | POA: Insufficient documentation

## 2016-10-03 DIAGNOSIS — S80862A Insect bite (nonvenomous), left lower leg, initial encounter: Secondary | ICD-10-CM | POA: Insufficient documentation

## 2016-10-03 DIAGNOSIS — J45909 Unspecified asthma, uncomplicated: Secondary | ICD-10-CM | POA: Insufficient documentation

## 2016-10-03 DIAGNOSIS — W57XXXA Bitten or stung by nonvenomous insect and other nonvenomous arthropods, initial encounter: Secondary | ICD-10-CM | POA: Insufficient documentation

## 2016-10-03 DIAGNOSIS — Y999 Unspecified external cause status: Secondary | ICD-10-CM | POA: Insufficient documentation

## 2016-10-03 DIAGNOSIS — Y9389 Activity, other specified: Secondary | ICD-10-CM | POA: Diagnosis not present

## 2016-10-03 DIAGNOSIS — Z87891 Personal history of nicotine dependence: Secondary | ICD-10-CM | POA: Diagnosis not present

## 2016-10-03 DIAGNOSIS — Z79899 Other long term (current) drug therapy: Secondary | ICD-10-CM | POA: Insufficient documentation

## 2016-10-03 NOTE — Discharge Instructions (Signed)
Please read and follow all provided instructions.  Your diagnoses today include:  1. Rash and nonspecific skin eruption   2. Insect bite, initial encounter     Tests performed today include: Vital signs. See below for your results today.   Medications prescribed:  Take as prescribed   Home care instructions:  Follow any educational materials contained in this packet.  Follow-up instructions: Please follow-up with your primary care provider for further evaluation of symptoms and treatment   Return instructions:  Please return to the Emergency Department if you do not get better, if you get worse, or new symptoms OR  - Fever (temperature greater than 101.74F)  - Bleeding that does not stop with holding pressure to the area    -Severe pain (please note that you may be more sore the day after your accident)  - Chest Pain  - Difficulty breathing  - Severe nausea or vomiting  - Inability to tolerate food and liquids  - Passing out  - Skin becoming red around your wounds  - Change in mental status (confusion or lethargy)  - New numbness or weakness    Please return if you have any other emergent concerns.  Additional Information:  Your vital signs today were: BP (!) 139/96 (BP Location: Right Arm)    Pulse 83    Temp 98 F (36.7 C) (Oral)    Resp 18    LMP 10/02/2016    SpO2 100%  If your blood pressure (BP) was elevated above 135/85 this visit, please have this repeated by your doctor within one month. ---------------

## 2016-10-03 NOTE — ED Triage Notes (Addendum)
Pt here for bumps to back of left leg, in circle area to left calf. Warm to touch onset 24 hours

## 2016-10-03 NOTE — ED Triage Notes (Signed)
Pt reports that she was here and was deleted from system pt has been here since 9 pm but was discharge by accident.

## 2016-10-03 NOTE — ED Triage Notes (Signed)
Pt c/o rash to LLE, denies new hygiene products/detergents.

## 2016-10-03 NOTE — ED Provider Notes (Signed)
[facility] DEPT Provider Note   CSN: [id number] Arrival date & time: 10/03/16  0243     History   Chief Complaint Chief Complaint  Patient presents with  . Rash    HPI Raphael Espe is a 36 y.o. female.  HPI  36 y.o. female, presents to the Emergency Department today due to bumps to back of leg. Onset around 24 hours. Unsure of insect bites. Multiple well defined areas of induration on leg. Around 8-10. Itchy. No pain. No purulence. No redness. No tenderness of palpation. No fevers. Began treatment with hydrocortisone cream with moderate relief. No new detergents. No new medications. No allergies. No other symptoms noted.   Past Medical History:  Diagnosis Date  . Alpha thalassemia ([place])   . Asthma    rescue inhaler- last use May 2017  . Complication of anesthesia   . Endometriosis   . Gestational diabetes    metformin and insulin  . Preterm labor with preterm delivery   . Tubular adenoma of colon     Patient Active Problem List   Diagnosis Date Noted  . Unwanted fertility 09/17/2016  . Bowel trouble 02/07/2016  . Slow transit constipation 02/07/2016    Past Surgical History:  Procedure Laterality Date  . CESAREAN SECTION    . CESAREAN SECTION N/A 10/29/2015   Procedure: CESAREAN SECTION;  Surgeon: Woodroe Mode, MD;  Location: [facility];  Service: Obstetrics;  Laterality: N/A;  . DILATION AND CURETTAGE OF UTERUS      OB History    Gravida Para Term Preterm AB Living   12 5 3 2 8 5    SAB TAB Ectopic Multiple Live Births   0 0 0 1 5       Home Medications    Prior to Admission medications   Medication Sig Start Date End Date Taking? Authorizing Provider  albuterol (PROVENTIL HFA;VENTOLIN HFA) 108 (90 BASE) MCG/ACT inhaler Inhale 2 puffs into the lungs every 6 (six) hours as needed for wheezing or shortness of breath. Reported on 05/02/2015    [provider]  linaclotide Rolan Lipa) 145 MCG CAPS capsule Take 1 capsule (145 mcg  total) by mouth daily before breakfast. 03/19/16   Pyrtle, Lajuan Lines, MD  mometasone (NASONEX) 50 MCG/ACT nasal spray Place 2 sprays into the nose daily. 06/28/15   Shelly Bombard, MD    Family History Family History  Problem Relation Age of Onset  . HIV Father   . Diabetes Maternal Grandmother   . Diabetes Paternal Grandmother   . Colon cancer Maternal Uncle   . Stomach cancer Neg Hx   . Rectal cancer Neg Hx   . Esophageal cancer Neg Hx   . Liver cancer Neg Hx     Social History Social History  Substance Use Topics  . Smoking status: Former Smoker    Types: Cigars  . Smokeless tobacco: Never Used     Comment: occ  . Alcohol use No     Allergies   Amoxicillin; Doxycycline; Percocet [oxycodone-acetaminophen]; Nerve block tray; and Penicillins   Review of Systems Review of Systems  Constitutional: Negative for fever.  Gastrointestinal: Negative for nausea and vomiting.  Skin: Positive for rash. Negative for wound.  Allergic/Immunologic: Negative for immunocompromised state.   Physical Exam Updated Vital Signs BP (!) 139/96 (BP Location: Right Arm)   Pulse 83   Temp 98 F (36.7 C) (Oral)   Resp 18   LMP 10/02/2016   SpO2 100%   Physical Exam  Constitutional: She is oriented to person, place, and time. Vital signs are normal. She appears well-developed and well-nourished.  HENT:  Head: Normocephalic.  Right Ear: Hearing normal.  Left Ear: Hearing normal.  Eyes: Pupils are equal, round, and reactive to light. Conjunctivae and EOM are normal.  Cardiovascular: Normal rate and regular rhythm.   Pulmonary/Chest: Effort normal.  Neurological: She is alert and oriented to person, place, and time.  Skin: Skin is warm and dry.  Posterior calf with multiple small well defined areas of induration. <0.5cm. Non TTP. No erythema. No purulence. No fluctuance.   Psychiatric: She has a normal mood and affect. Her speech is normal and behavior is normal. Thought content normal.      ED Treatments / Results  Labs (all labs ordered are listed, but only abnormal results are displayed) Labs Reviewed - No data to display  EKG  EKG Interpretation None       Radiology No results found.  Procedures Procedures (including critical care time)  Medications Ordered in ED Medications - No data to display   Initial Impression / Assessment and Plan / ED Course  I have reviewed the triage vital signs and the nursing notes.  Pertinent labs & imaging results that were available during my care of the patient were reviewed by me and considered in my medical decision making (see chart for details).  Final Clinical Impressions(s) / ED Diagnoses     {I have reviewed the relevant previous healthcare records.  {I obtained HPI from historian.   ED Course:  Assessment: Pt is a 36 y.o. female  presents to the Emergency Department today due to bumps to back of leg. Onset around 24 hours. Unsure of insect bites. Multiple well defined areas of induration on leg. Around 8-10. Itchy. No pain. No purulence. No redness. No tenderness of palpation. No fevers. Began treatment with hydrocortisone cream with moderate relief. No new detergents. No new medications. No allergies. On exam, pt in NAD. Nontoxic/nonseptic appearing. VSS. Afebrile. Posterior calf with multiple small well defined areas of induration. <0.5cm. Non TTP. No erythema. No purulence. No fluctuance. Suspect possible insect bite. No signs of infection. Treat with hydrocortisone cream. Plan is to DC home with follow up to PCP. At time of discharge, Patient is in no acute distress. Vital Signs are stable. Patient is able to ambulate. Patient able to tolerate PO.    Disposition/Plan:  DC Home Additional Verbal discharge instructions given and discussed with patient.  Pt Instructed to f/u with PCP in the next week for evaluation and treatment of symptoms. Return precautions given Pt acknowledges and agrees with  plan  Supervising Physician Jola Schmidt, MD  Final diagnoses:  Rash and nonspecific skin eruption  Insect bite, initial encounter    New Prescriptions New Prescriptions   No medications on file     Conni Slipper 10/03/16 [id number]    Jola Schmidt, MD 10/03/16 [phone]

## 2016-10-22 ENCOUNTER — Encounter: Payer: Self-pay | Admitting: Neurology

## 2016-10-22 ENCOUNTER — Ambulatory Visit (INDEPENDENT_AMBULATORY_CARE_PROVIDER_SITE_OTHER): Payer: Medicaid Other | Admitting: Neurology

## 2016-10-22 VITALS — BP 122/83 | HR 82 | Ht 63.0 in | Wt 233.0 lb

## 2016-10-22 DIAGNOSIS — G4719 Other hypersomnia: Secondary | ICD-10-CM | POA: Diagnosis not present

## 2016-10-22 DIAGNOSIS — Z6841 Body Mass Index (BMI) 40.0 and over, adult: Secondary | ICD-10-CM | POA: Diagnosis not present

## 2016-10-22 DIAGNOSIS — R442 Other hallucinations: Secondary | ICD-10-CM | POA: Insufficient documentation

## 2016-10-22 DIAGNOSIS — R0683 Snoring: Secondary | ICD-10-CM

## 2016-10-22 DIAGNOSIS — O0940 Supervision of pregnancy with grand multiparity, unspecified trimester: Secondary | ICD-10-CM

## 2016-10-22 DIAGNOSIS — O24419 Gestational diabetes mellitus in pregnancy, unspecified control: Secondary | ICD-10-CM | POA: Diagnosis not present

## 2016-10-22 DIAGNOSIS — F518 Other sleep disorders not due to a substance or known physiological condition: Secondary | ICD-10-CM

## 2016-10-22 DIAGNOSIS — R6889 Other general symptoms and signs: Secondary | ICD-10-CM

## 2016-10-22 NOTE — Progress Notes (Signed)
SLEEP MEDICINE CLINIC   Provider:  Larey Seat, M D  Primary Care Physician:  Minette Brine   Referring Provider: Minette Brine, FNP    Chief Complaint  Patient presents with  . New Patient (Initial Visit)    pt alone, pt was gestational diabetic during her pregnacy.  pt has been having headaches for 5 mths. and then pt states that even if she gets a full night sleep she is fatigue thru out the day.     HPI:  Kristen [name] is a 36 y.o. female , seen here as in a referral/ revisit  from NP  [facility] for evaluation of fatigue and daytime sleepiness.   Today's 10/22/2016, and at the pleasure of meeting Ms. Kristen [name], a 36 year old African-American female patient but reports that she suffers from excessive daytime sleepiness and fatigue. Mrs. Kristen [name] youngest child is now 24 year old, but she states that fatigue and sleepiness were present before pregnancy and delivery of her son. All her pregnancy has been super fatiguing, and she always had joint pain. Her other children are 75, 75, 22 and 81 years old, and she did  have the same sleepiness or fatigue at the time when they were young. She was sleepy during high school.  She has been falling asleep at red lights. She started drinking coffee. She has insomnia. She gained weight throughout the last 5 years.   Mrs. Kristen [name] has headaches, she has alpha thalassemia III, joint pain, asthma and GERD. Since 2014 these diagnoses have been part of her medical record.   Chief complaint according to patient : She is dozing while driving!    Sleep habits are as follows: she goes to bed after her children and her husband, she goes at 9 PM. She tends to feel quite ready for bed already at 7 PM, her husband goes to bed around 8 if he rises earlier. She will fall asleep immediately when she goes to the bedroom, she prefers to sleep on her side, on one or 2 pillows. Her children and her husband have noted her to snore over the last years. She is  drooling in her sleep. The bedroom is dark, quiet and cool.  She wakes up 2-4 times each night, normally she can go back to sleep. She rises at 6 AM and drives her children at 7.30 AM. Returns home at 8.30 AM - naps at 9 AM for one hour. Another nap at noon , when her baby son naps.  No nocturia. No choking. She wakes with a dry mouth but not with headaches.  Headaches set on in the middle of the day.   Sleep medical history and family sleep history:  Never been sleep tested, she snores, she drools, and she is fatigued all day.  No family history of sleep apnea. Mother has insomnia.  Social history:  Married with 5 children , 2 of her children were premature.  Tobacco use cigarettes until age 38. Sometimes still smoking cigars.  ETOH use, socially, caffeine : daily- 1-3 mugs a day, but is still nursing. Coffee helps with HA>    Review of Systems: Out of a complete 14 system review, the patient complains of only the following symptoms, and all other reviewed systems are negative. Snoring , multiple naps a day, EDS.    How likely are you to doze in the following situations: 0 = not likely, 1 = slight chance, 2 = moderate chance, 3 = high chance  Sitting and Reading? 3  Watching Television? 3 Sitting inactive in a public place (theater or meeting)?2 As a passenger in a car for an hour without a break?3  Lying down in the afternoon when circumstances permit?3 Sitting and talking to someone?1 Sitting quietly after lunch without alcohol?2 In a car, while stopped for a few minutes in traffic?1  Total = 18/ 24   Epworth score 18 , Fatigue severity score 59,, depression score 3/ 15    Social History   Social History  . Marital status: Married    Spouse name: N/A  . Number of children: 5  . Years of education: N/A   Occupational History  . [employer]    Social History Main Topics  . Smoking status: Former Smoker    Types: Cigars  . Smokeless tobacco: Never Used     Comment: occ  .  Alcohol use No  . Drug use: No  . Sexual activity: Yes    Partners: Male   Other Topics Concern  . Not on file   Social History Narrative  . No narrative on file    Family History  Problem Relation Age of Onset  . HIV Father   . Diabetes Maternal Grandmother   . Diabetes Paternal Grandmother   . Colon cancer Maternal Uncle   . Stomach cancer Neg Hx   . Rectal cancer Neg Hx   . Esophageal cancer Neg Hx   . Liver cancer Neg Hx     Past Medical History:  Diagnosis Date  . Alpha thalassemia ([place])   . Asthma    rescue inhaler- last use May 2017  . Complication of anesthesia   . Endometriosis   . Gestational diabetes    metformin and insulin  . Preterm labor with preterm delivery   . Tubular adenoma of colon     Past Surgical History:  Procedure Laterality Date  . CESAREAN SECTION    . CESAREAN SECTION N/A 10/29/2015   Procedure: CESAREAN SECTION;  Surgeon: Woodroe Mode, MD;  Location: [facility];  Service: Obstetrics;  Laterality: N/A;  . DILATION AND CURETTAGE OF UTERUS      Current Outpatient Prescriptions  Medication Sig Dispense Refill  . albuterol (PROVENTIL HFA;VENTOLIN HFA) 108 (90 BASE) MCG/ACT inhaler Inhale 2 puffs into the lungs every 6 (six) hours as needed for wheezing or shortness of breath. Reported on 05/02/2015    . linaclotide (LINZESS) 145 MCG CAPS capsule Take 1 capsule (145 mcg total) by mouth daily before breakfast. 30 capsule 3  . mometasone (NASONEX) 50 MCG/ACT nasal spray Place 2 sprays into the nose daily. 17 g 12   Current Facility-Administered Medications  Medication Dose Route Frequency Provider Last Rate Last Dose  . 0.9 %  sodium chloride infusion  500 mL Intravenous Continuous Pyrtle, Lajuan Lines, MD        Allergies as of 10/22/2016 - Review Complete 10/22/2016  Allergen Reaction Noted  . Amoxicillin Hives and Shortness Of Breath 04/22/2013  . Doxycycline Hives and Shortness Of Breath 05/11/2015  . Percocet  [oxycodone-acetaminophen] Anaphylaxis 07/20/2013  . Nerve block tray Itching and Other (See Comments) 03/07/2011  . Penicillins Hives 04/22/2013    Vitals: BP 122/83   Pulse 82   Ht 5\' 3"  (1.6 m)   Wt 233 lb (105.7 kg)   LMP 10/02/2016   BMI 41.27 kg/m  Last Weight:  Wt Readings from Last 1 Encounters:  10/22/16 233 lb (105.7 kg)   MWN:UUVO mass index is 41.27 kg/m.  Last Height:   Ht Readings from Last 1 Encounters:  10/22/16 5\' 3"  (1.6 m)    Physical exam:  General: The patient is awake, alert and appears not in acute distress. The patient is well groomed. Head: Normocephalic, atraumatic. Neck is supple. Mallampati 5  neck circumference: 15.5 . Nasal airflow patent ,  Retrognathia is seen. She is a mouth breather   Cardiovascular:  Regular rate and rhythm  without  murmurs or carotid bruit, and without distended neck veins. Respiratory: Lungs are clear to auscultation. Skin:  Without evidence of edema, or rash Trunk: BMI is 41 Neurologic exam :The patient is awake and alert, oriented to place and time.   Memory subjective  described as impaired by fatigue.   Attention span & concentration ability appears normal.  Speech is fluent,  without dysarthria- has mild dysphonia .  Mood and affect are appropriate.  Cranial nerves: Pupils are equal and briskly reactive to light. Funduscopic exam without  evidence of pallor or edema. Extraocular movements  in vertical and horizontal planes intact and without nystagmus. Visual fields by finger perimetry are intact.Hearing to finger rub intact. Facial sensation intact to fine touch. Facial motor strength is symmetric and tongue and uvula move midline. Shoulder shrug was symmetrical.   Motor exam:  Normal tone, muscle bulk and symmetric strength in all extremities.Sensory:  Fine touch, pinprick and vibration were tested in all extremities. Proprioception tested in the upper extremities was normal. Coordination: Rapid alternating  movements in the fingers/hands was normal. Finger-to-nose maneuver  normal without evidence of ataxia, dysmetria or tremor. Gait and station: Patient walks without assistive device . Strength within normal limits. Stance is stable and normal. Tandem gait is unfragmented. Turns with 3  Steps.  Deep tendon reflexes: in the  upper and lower extremities are symmetric .   Assessment:  After physical and neurologic examination, review of laboratory studies,  Personal review of imaging studies, reports of other /same  Imaging studies, results of polysomnography and / or neurophysiology testing and pre-existing records as far as provided in visit., my assessment is   1) Mrs. Baumgarten reports excessive daytime sleepiness even throughout her school education, and it exacerbated probably in her 62s. Pregnancies, nursing have also contributed to a sense of fatigue. But she states that for the first time she is now concerned about her driving safety, suffering from sleep attacks, irresistible urge to sleep but is very hard to control or suppress. When she finds time and her busy day to be physically inactive and mentally not stimulated she has no trouble to fall asleep. She takes multiple naps a day. She sleeps probably 8 hours each night, and still has excessive daytime sleepiness. My workup will be addressing 2 issues, - 1a)one is her snoring that begun when she became more obese and could be a sign of sleep apnea being present,   1b )the other is directed to hypersomnia, she reports very vivid lucid dreams at night, sometimes she is not sure what happened in her dream and if it's reality or not, she describes hypnagogic, hypnopompic visions and drowsiness. Sometimes she dreams that she is falling, other times she wakes up and feels paralyzed. She has not reported cataplectic events, sometimes when she gets startled she may freeze, but she doesn't feel as if her knees buckle or her jaw drops.  2)Co-morbidities  include thalassemia, and obesity, BMI over 40. She may have hypoxemia and hypercapnia.   The patient was advised of the nature of  the diagnosed disorder , the treatment options and the  risks for general health and wellness arising from not treating the condition.   I spent more than 45 minutes of face to face time with the patient.  Greater than 50% of time was spent in counseling and coordination of care. We have discussed the diagnosis and differential and I answered the patient's questions.    Plan:  Treatment plan and additional workup :  I will order a PSG with MSLT to follow.  If apnea is found, will write for CPAP treatment and cancel MSLT.     Larey Seat, MD 2/37/6283, 15:17 AM  Certified in Neurology by ABPN Certified in [place] by [facility] [address], [place], [place] [address]

## 2016-10-22 NOTE — Patient Instructions (Signed)
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 We may have to try different medications that may help you stay awake during the day. Not everything works with everybody the same way. Wake promoting agents include stimulants and non-stimulant type medications. The most common side effects with stimulants are weight loss, insomnia, nervousness, headaches, palpitations, rise in blood pressure, anxiety. Stimulants can be addictive and subject to abuse. Non-stimulant type wake promoting medications include Provigil and Nuvigil, most common side effects include headaches, nervousness, insomnia, hypertension. In addition there is a medication called Xyrem which has been proven to be very effective in patients with narcolepsy with or without cataplexy. Some patients with narcolepsy report episodes of weakness, such as jaw or facial weakness, legs giving out, feeling wobbly or like "Jell-o", etc. in situations of anxiety, stress, laughter, sudden sadness, surprise, etc., which is called cataplexy. You can also experience episodes of sleep paralysis during which you may feel unable to move upon awakening. Some people experience dreamlike sequences upon awakening or upon drifting off to sleep, called hypnopompic or hypnagogic hallucinations.  

## 2016-10-29 IMAGING — US US MFM FETAL BPP W/O NON-STRESS
1 series · 13 of 28 positions shown · non-contrast
Comparison: none

[Series 1: us mfm fetal bpp w/o non-stress · 32 acquisitions, 13 frames shown]
[im 2/32]
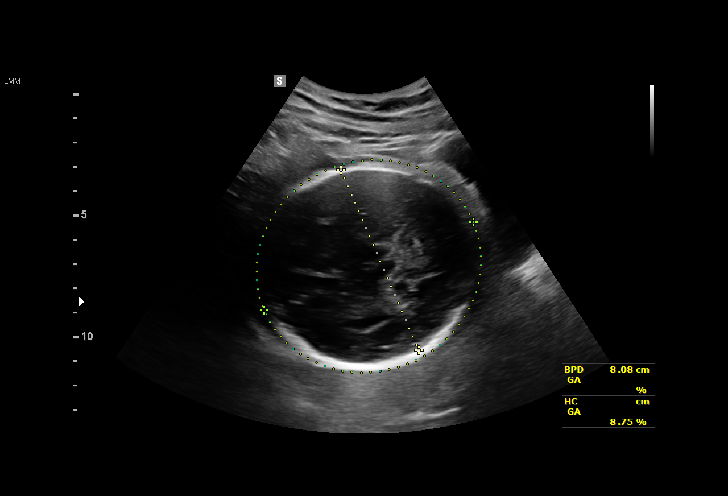
[im 4/32]
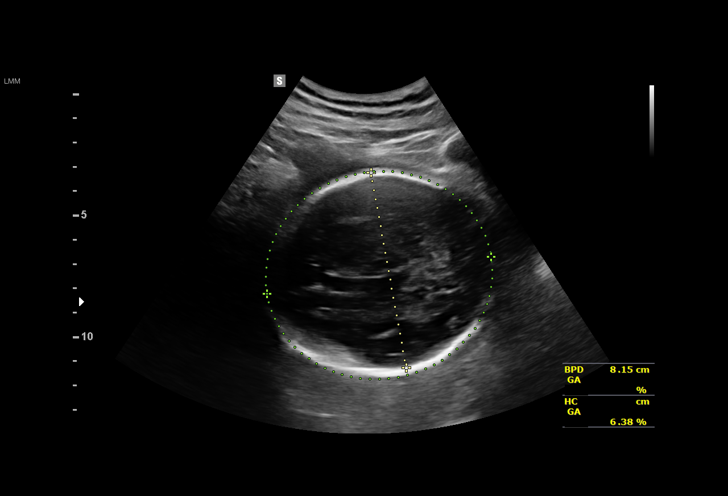
[im 6/32]
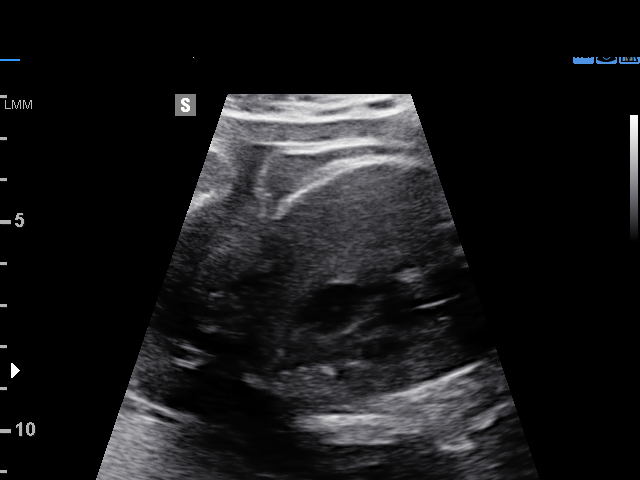
[im 9/32]
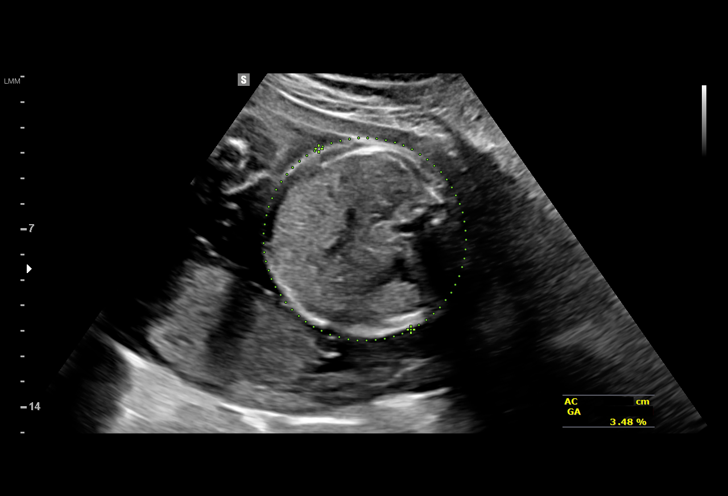
[im 11/32]
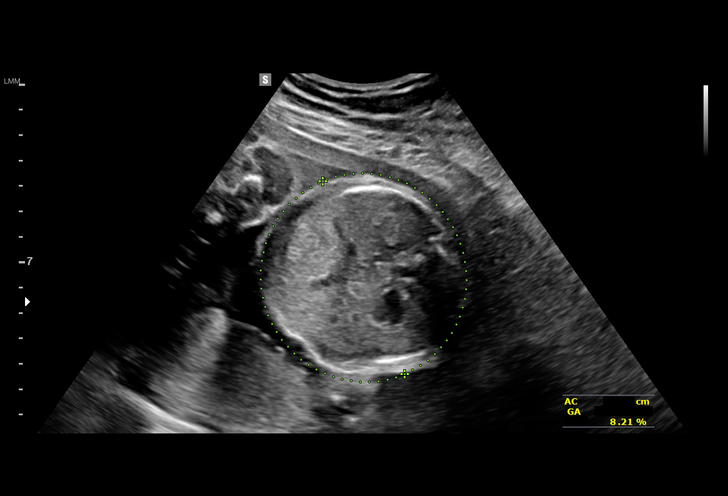
[im 13/32]
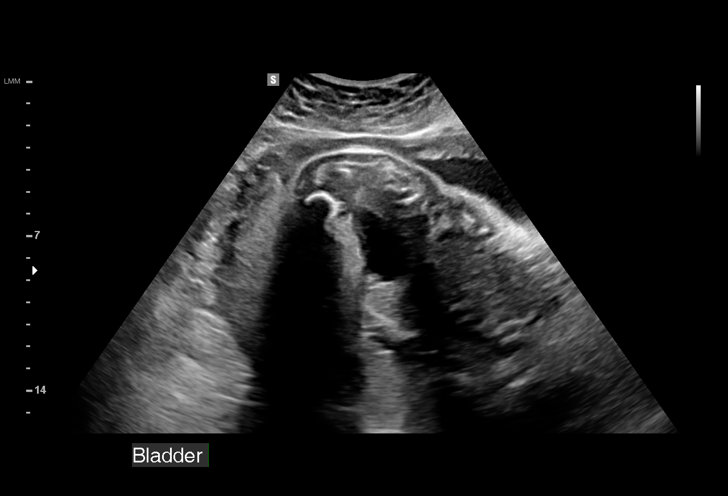
[im 17/32]
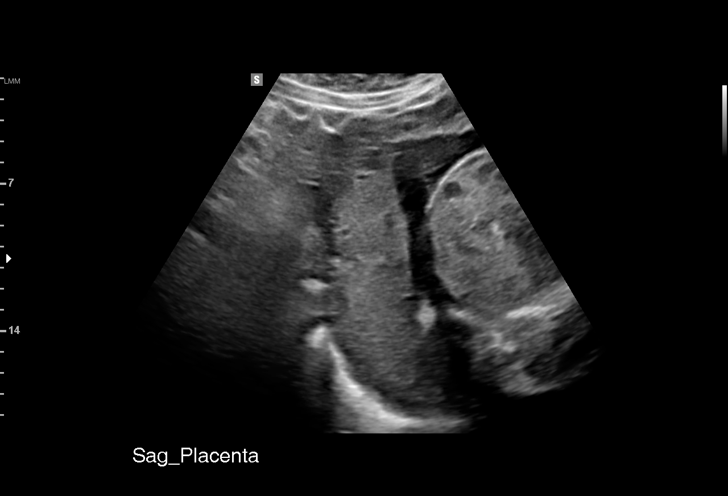
[im 19/32]
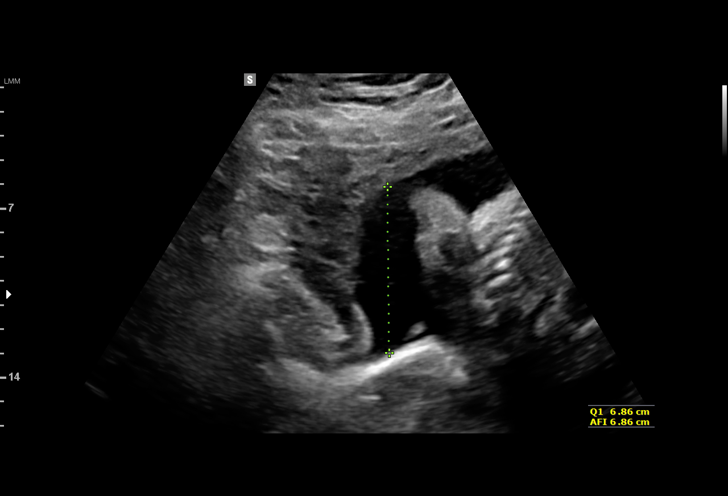
[im 21/32]
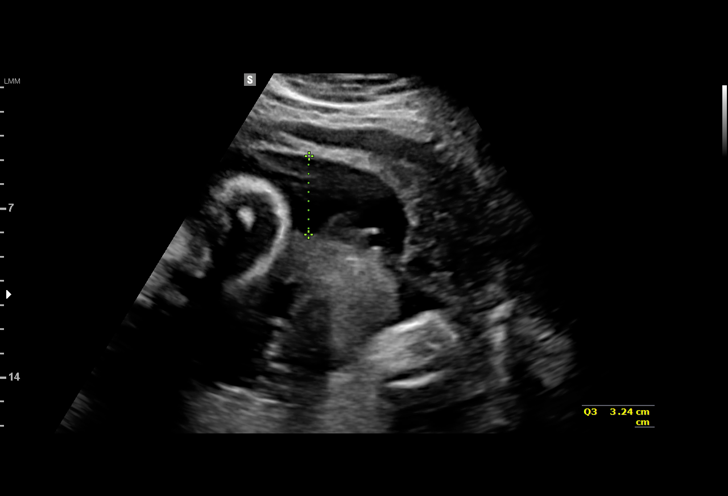
[im 23/32]
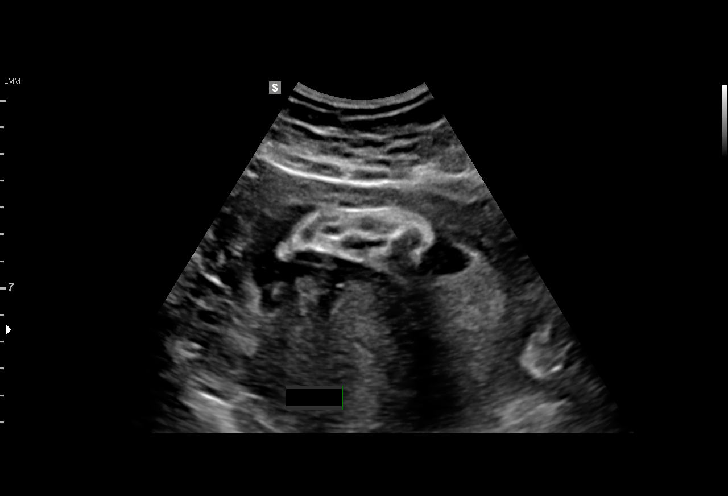
[im 26/32]
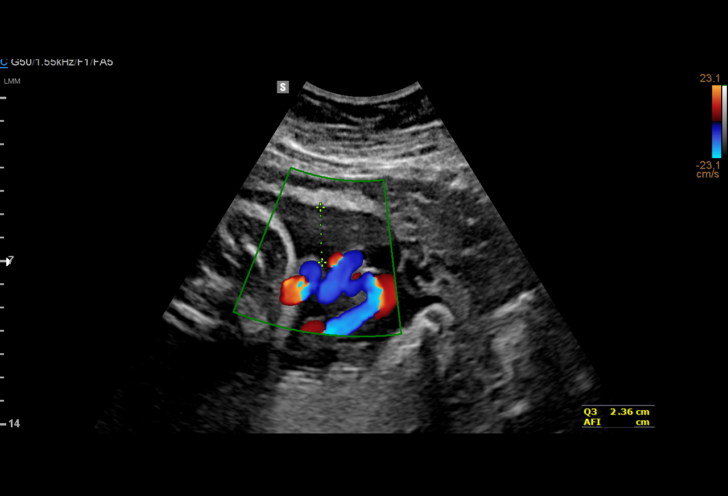
[im 28/32]
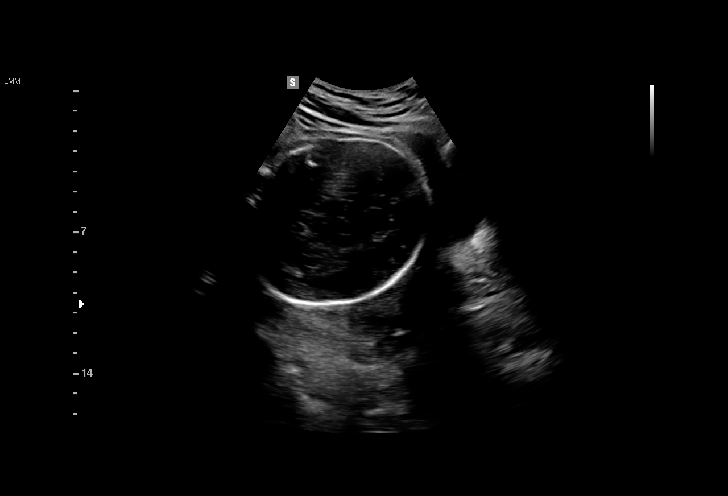
[im 30/32]
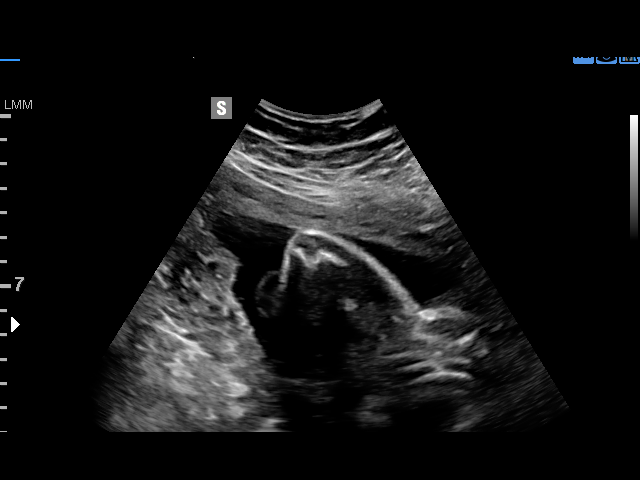

[13 of 28 positions shown; findings below may reference images not displayed]

pm)

1  QUEDIN SUMMERS           [id number]      [id number]     [id number]
2  QUEDIN SUMMERS           [id number]      [id number]     [id number]
3  QUEDIN SUMMERS           [id number]      [id number]     [id number]
Indications

31 weeks gestation of pregnancy
Large for gestational age fetus affecting
management of mother
Obesity complicating pregnancy, third
trimester
Poor obstetric history: Previous preterm
delivery (twins, 18 weeks then 24 weeks; 16
weeks; 34 weeks; 18 weeks)
Previous cesarean delivery, antepartum
Diabetes - Gestational (glucophage)
OB History

Blood Type:            Height:  5'4"   Weight (lb):  251       BMI:
Gravidity:    12        Term:   2        Prem:   2        SAB:   8
Living:       4
Fetal Evaluation

Num Of Fetuses:     1
Fetal Heart         154
Rate(bpm):
Cardiac Activity:   Observed
Presentation:       Cephalic
Placenta:           Posterior, above cervical os
Amniotic Fluid
AFI FV:      Subjectively within normal limits

AFI Sum(cm)     %Tile       Largest Pocket(cm)
21.92           85

RUQ(cm)       RLQ(cm)       LUQ(cm)        LLQ(cm)
6.86
Biophysical Evaluation

Amniotic F.V:   Pocket => 2 cm two         F. Tone:        Observed
planes
F. Movement:    Observed                   Score:          [DATE]
F. Breathing:   Observed
Biometry

BPD:      81.4  mm     G. Age:  32w 5d         78  %    CI:        82.03   %    70 - 86
FL/HC:      19.4   %    19.3 -
HC:      283.6  mm     G. Age:  31w 1d         10  %    HC/AC:      1.13        0.96 -
AC:      251.6  mm     G. Age:  29w 3d          5  %    FL/BPD:     67.7   %    71 - 87
FL:       55.1  mm     G. Age:  29w 0d        < 3  %    FL/AC:      21.9   %    20 - 24

Est. FW:    3343  gm      3 lb 3 oz     22  %
Gestational Age

LMP:           31w 3d        Date:  02/04/15                 EDD:   11/11/15
U/S Today:     30w 4d                                        EDD:   11/17/15
Best:          31w 3d     Det. By:  LMP  (02/04/15)          EDD:   11/11/15
Anatomy

Cranium:               Appears normal         LVOT:                   Previously seen
Cavum:                 Previously seen        Aortic Arch:            Previously seen
Ventricles:            Previously seen        Ductal Arch:            Previously seen
Choroid Plexus:        Previously seen        Diaphragm:              Previously seen
Cerebellum:            Previously seen        Stomach:                Appears normal, left
sided
Posterior Fossa:       Previously seen        Abdomen:                Previously seen
Nuchal Fold:           Previously seen        Abdominal Wall:         Previously seen
Face:                  Orbits appear          Cord Vessels:           Previously seen
normal
Lips:                  Previously seen        Kidneys:                Appear normal
Palate:                Previously seen        Bladder:                Appears normal
Thoracic:              Previously seen        Spine:                  Previously seen
Heart:                 Previously seen        Upper Extremities:      Previously seen
RVOT:                  Previously seen        Lower Extremities:      Previously seen

Other:  Heels and 5th digit previously seen. Technically difficult due to
advanced GA and fetal position.
Doppler - Fetal Vessels

Umbilical Artery
S/D     %tile
3.1       67
Impression

SIUP at 06w0d, [id number], obesity, hx IUGR at 31 weeks last
pregnancy
active singleton fetus
EFW 22nd%'le, AC 5th%'le (-1SD)
HC/AC 1.13 (upper normal)
UA Dopplers are normal
fluid volume is upper normal by maximum vertical pocket and
AFI
no previa
BPP [DATE]
Recommendations

Given hx IUGR and trend in AC, interval growth in 2 weeks
with UA Dopplers if indicated.
BPP/AFI in 1 week (antenatal testing at 32 weeks).

## 2016-11-02 IMAGING — US US MFM FETAL BPP W/O NON-STRESS
1 series · 10 of 10 positions shown · non-contrast
Comparison: none

[Series 1: us mfm fetal bpp w/o non-stress · 10 acquisitions, 10 frames shown]
[im 1/10]
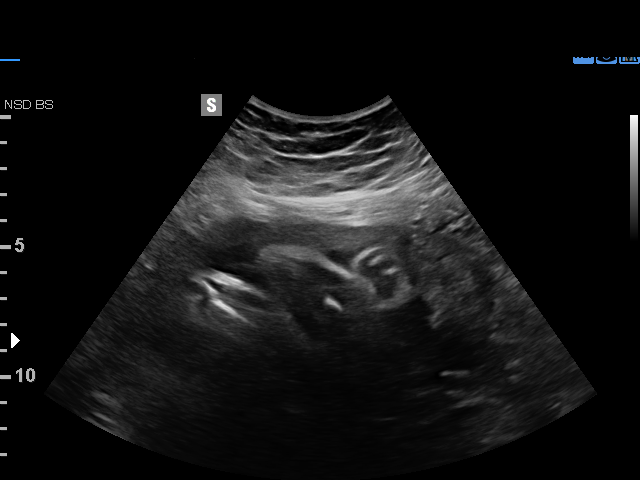
[im 2/10]
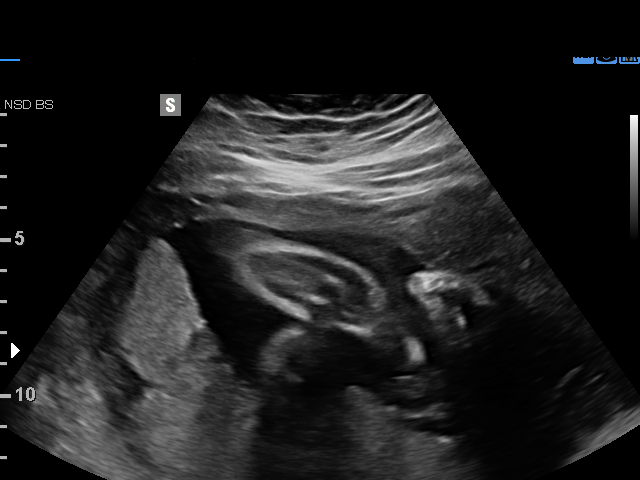
[im 3/10]
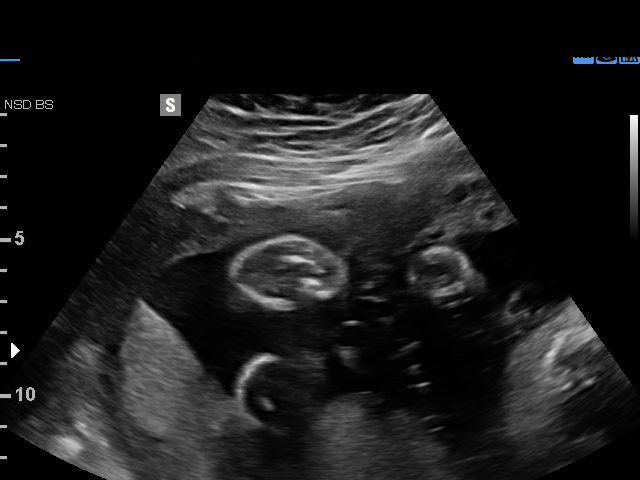
[im 4/10]
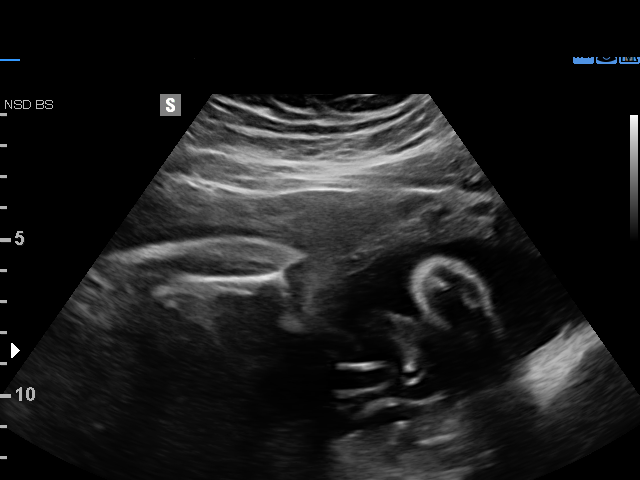
[im 5/10]
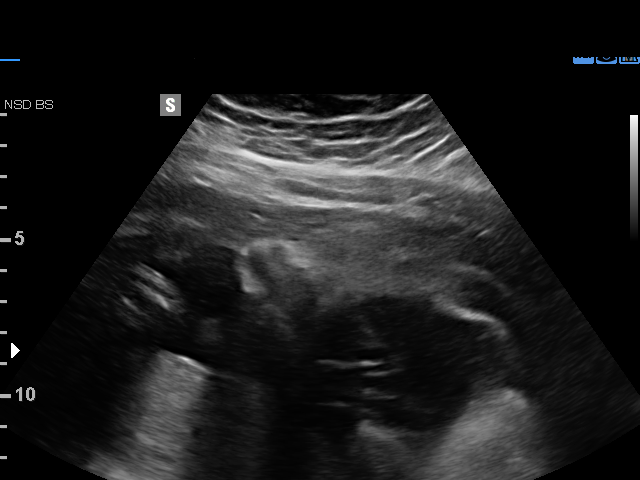
[im 6/10]
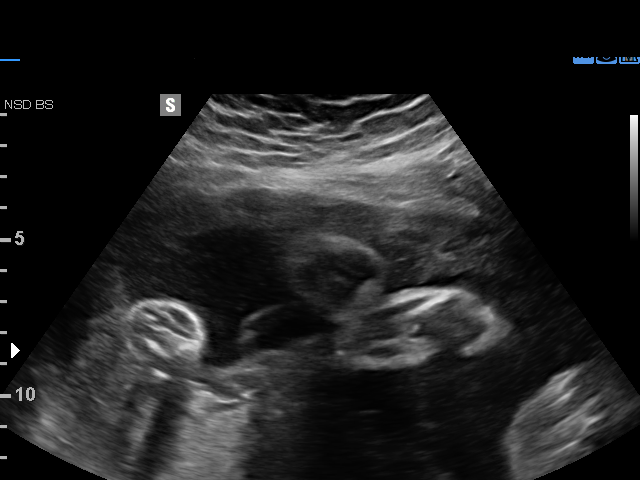
[im 7/10]
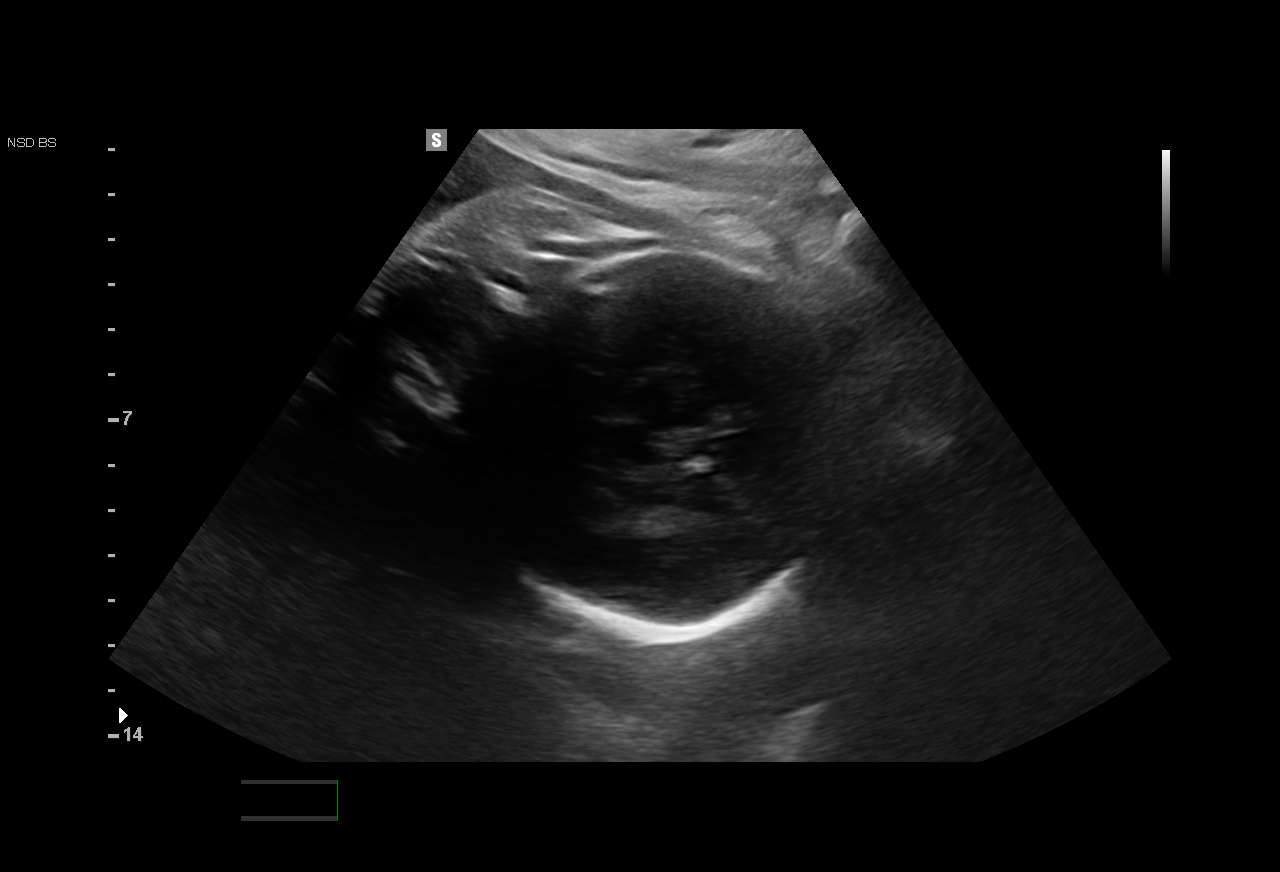
[im 8/10]
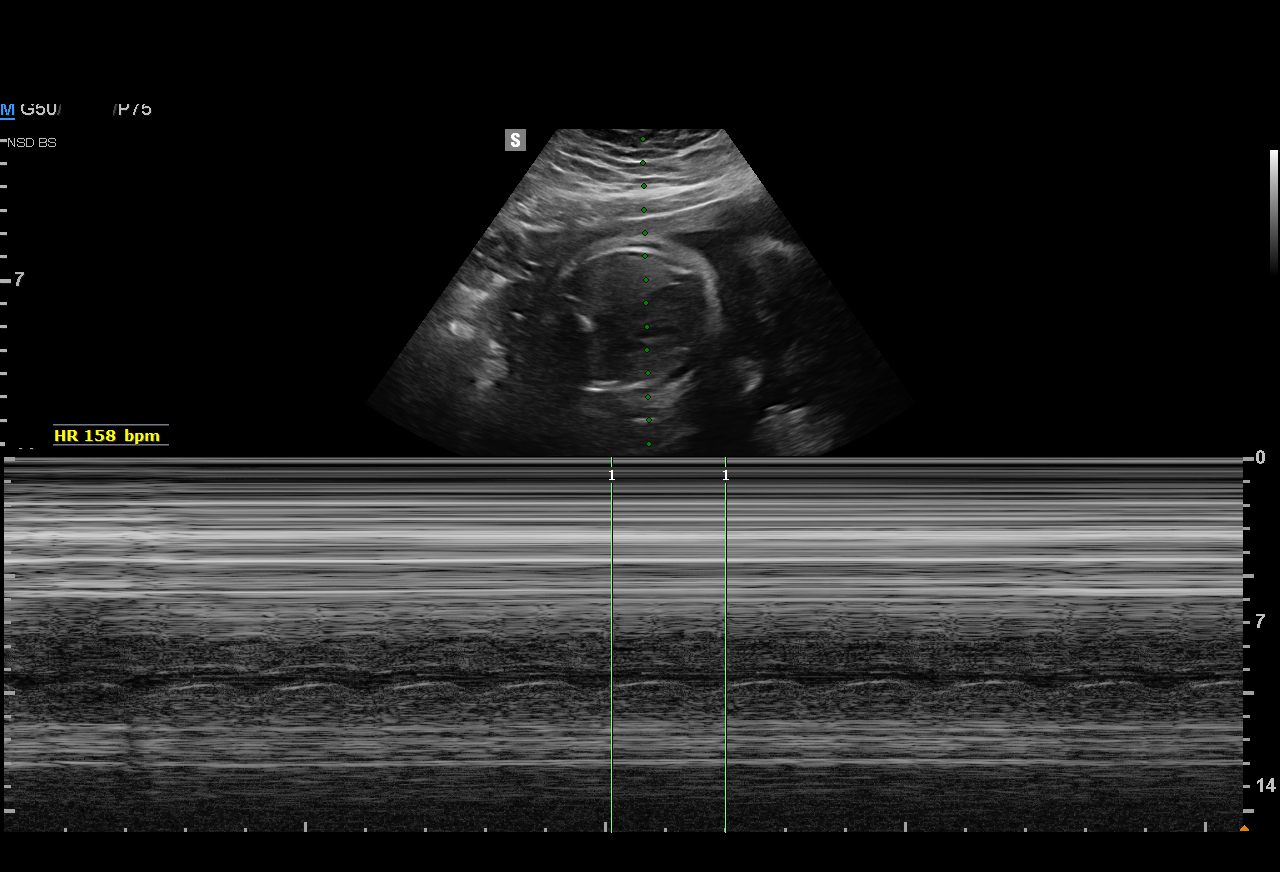
[im 9/10]
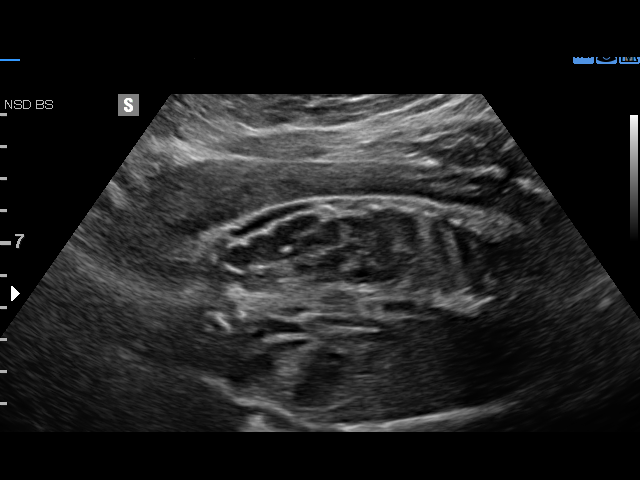
[im 10/10]
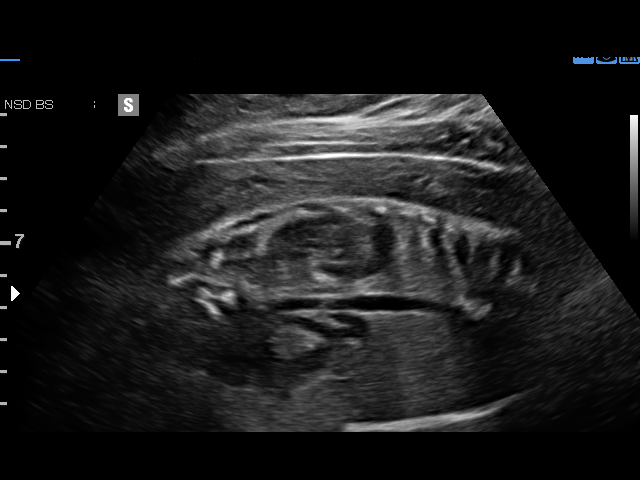

[10 of 10 positions shown; findings below may reference images not displayed]

Attending:        Hieu Merkle        Secondary Phy.:   [facility]-
MAU/Triage

1  LON BORJA             [id number]      [id number]     [id number]
Indications

32 weeks gestation of pregnancy
Obesity complicating pregnancy, third
trimester
Poor obstetric history: Previous preterm
delivery (twins, 18 weeks then 24 weeks; 16
weeks; 34 weeks; 18 weeks)
Previous cesarean delivery, antepartum
Diabetes - Gestational (glucophage)
Maternal care for known or suspected poor
fetal growth, third trimester, not applicable or
unspecified
Decreased fetal movements, third trimester,
unspecified
Non-reactive NST
OB History

Blood Type:            Height:  5'4"   Weight (lb):  251      BMI:
Gravidity:    12        Term:   2        Prem:   2        SAB:   8
Living:       4
Fetal Evaluation

Num Of Fetuses:     1
Fetal Heart         158
Rate(bpm):
Cardiac Activity:   Observed
Presentation:       Cephalic
Amniotic Fluid
AFI FV:      Subjectively within normal limits

AFI Sum(cm)     %Tile       Largest Pocket(cm)
15.16           53

RUQ(cm)       RLQ(cm)       LUQ(cm)        LLQ(cm)
2.04
Biophysical Evaluation

Amniotic F.V:   Pocket => 2 cm two         F. Tone:        Observed
planes
F. Movement:    Observed                   Score:          [DATE]
F. Breathing:   Observed
Gestational Age

LMP:           32w 0d       Date:   02/04/15                 EDD:   11/11/15
Best:          32w 0d    Det. By:   LMP  (02/04/15)          EDD:   11/11/15
Impression

Single IUP at 32w 0d
BPP [DATE]
Cephalic presentation
Normal amniotic fluid volume
Recommendations

Follow-up ultrasounds as clinically indicated.

## 2016-11-06 NOTE — Patient Instructions (Signed)
Your procedure is scheduled on:  Thursday, Sept. 27, 2018  Enter through the [employer] [facility] at:  11:30 AM  Pick up the phone at the desk and dial [phone].  Call this number if you have problems the morning of surgery: [phone].  Remember: Do NOT eat food:  After Midnight Wednesday  Do NOT drink clear liquids after:  7:00 AM Thursday  Take these medicines the morning of surgery with a SIP OF WATER:  None  Use Nasal spray per normal routine  Bring Asthma Inhaler day of surgery  Stop ALL herbal medications at this time  Do NOT smoke the day of surgery.  Do NOT wear jewelry (body piercing), metal hair clips/bobby pins, make-up, artifical eyelashes or nail polish. Do NOT wear lotions, powders, or perfumes.  You may wear deodorant. Do NOT shave for 48 hours prior to surgery. Do NOT bring valuables to the hospital. Contacts, dentures, or bridgework may not be worn into surgery.  Have a responsible adult drive you home and stay with you for 24 hours after your procedure  Bring a copy of your healthcare power of attorney and living will documents.

## 2016-11-10 ENCOUNTER — Inpatient Hospital Stay (HOSPITAL_COMMUNITY)
Admission: RE | Admit: 2016-11-10 | Discharge: 2016-11-10 | Disposition: A | Discharge status: Home / Self Care | Payer: Self-pay | Source: Ambulatory Visit

## 2016-11-20 ENCOUNTER — Ambulatory Visit (HOSPITAL_COMMUNITY)
Admission: RE | Admit: 2016-11-20 | Payer: Medicaid Other | Source: Ambulatory Visit | Admitting: Obstetrics and Gynecology

## 2016-11-20 ENCOUNTER — Encounter (HOSPITAL_COMMUNITY): Admission: RE | Payer: Self-pay | Source: Ambulatory Visit

## 2016-11-20 ENCOUNTER — Telehealth: Payer: Self-pay | Admitting: Neurology

## 2016-11-20 SURGERY — LIGATION, FALLOPIAN TUBE, LAPAROSCOPIC
Anesthesia: Choice | Laterality: Bilateral

## 2016-11-20 NOTE — Telephone Encounter (Signed)
We have attempted to call the patient 3 times to schedule sleep study. Patient has been unavailable at the phone numbers we have on file and has not returned our calls. At this point we will send a letter asking pt to please contact the sleep lab to schedule their sleep study. If patient calls back we will schedule them for their sleep study. ° °

## 2016-11-27 IMAGING — US US MFM OB LIMITED
1 series · 15 of 28 positions shown · non-contrast
Comparison: none

[Series 1: us mfm ob limited · 33 acquisitions, 15 frames shown]
[im 1/33]
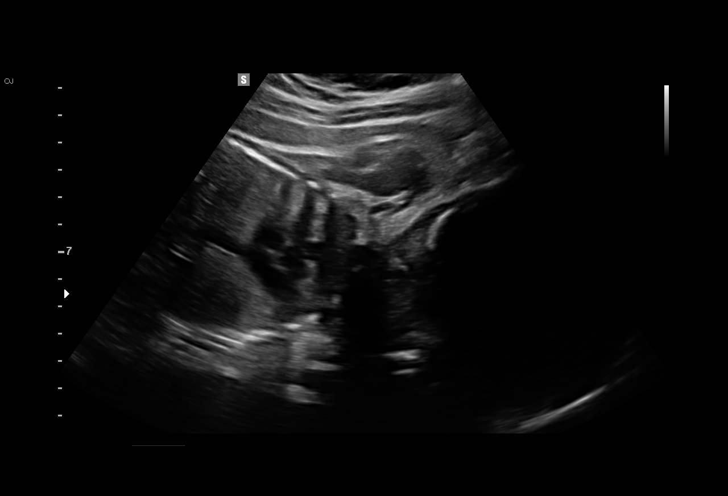
[im 3/33]
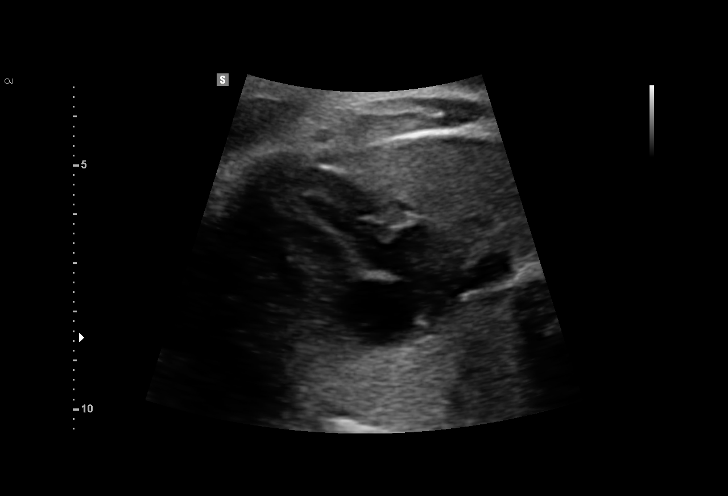
[im 5/33]
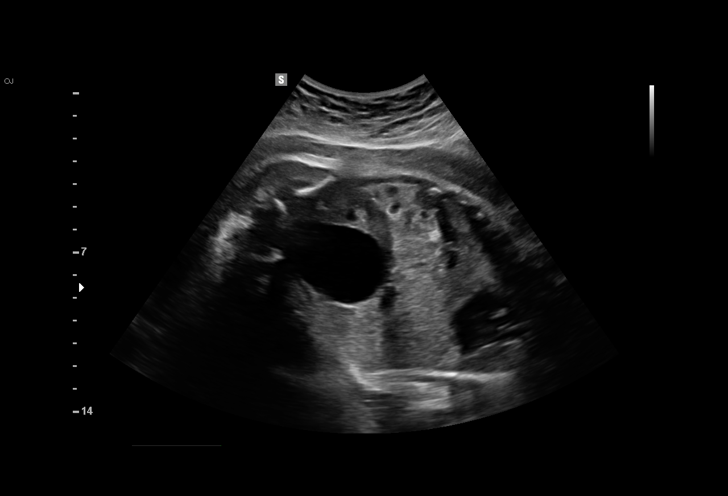
[im 8/33]
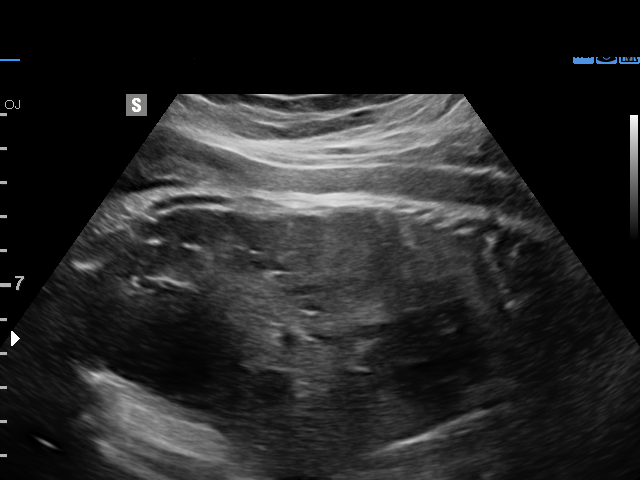
[im 10/33]
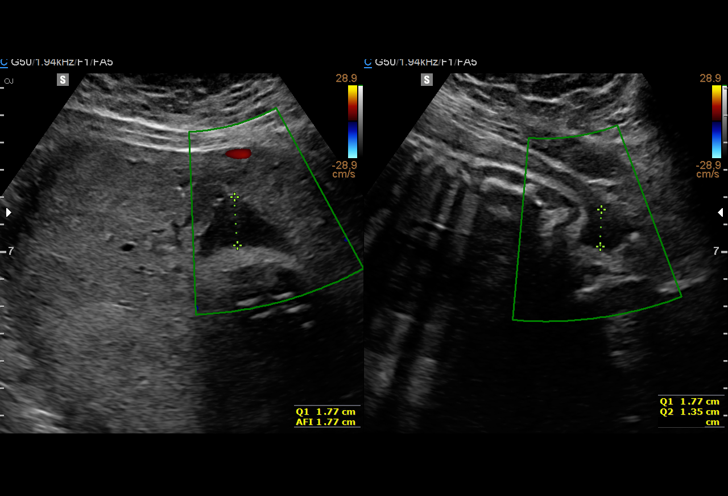
[im 12/33]
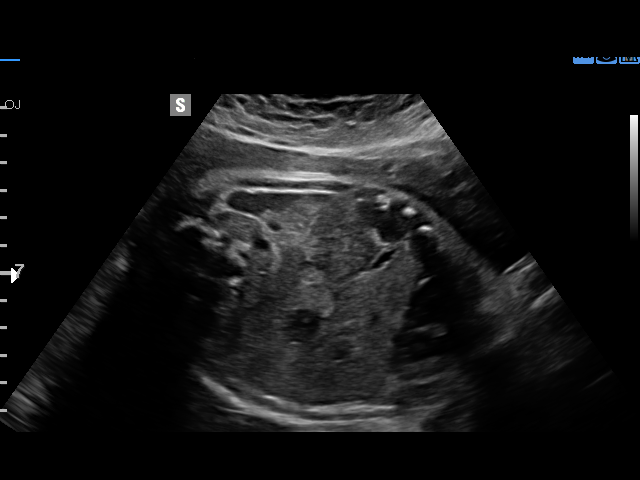
[im 15/33]
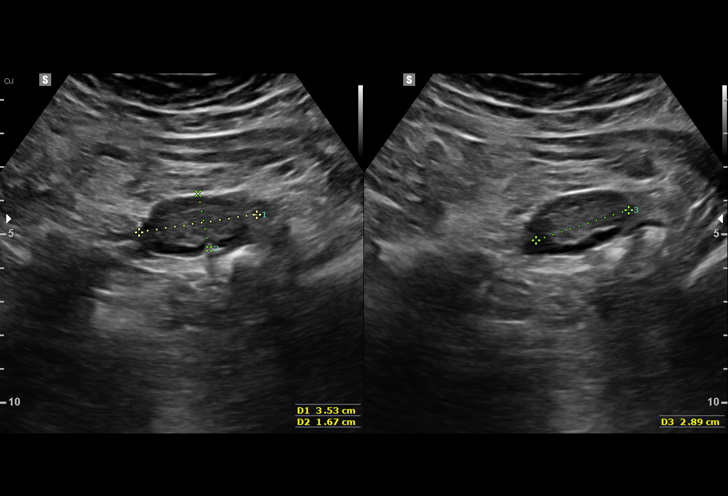
[im 17/33]
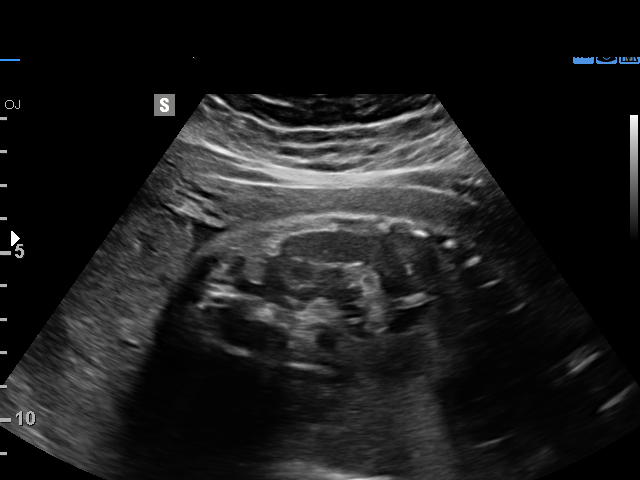
[im 18/33]
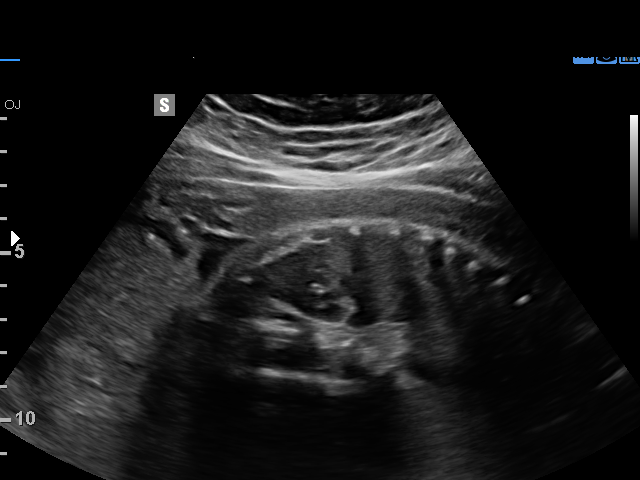
[im 21/33]
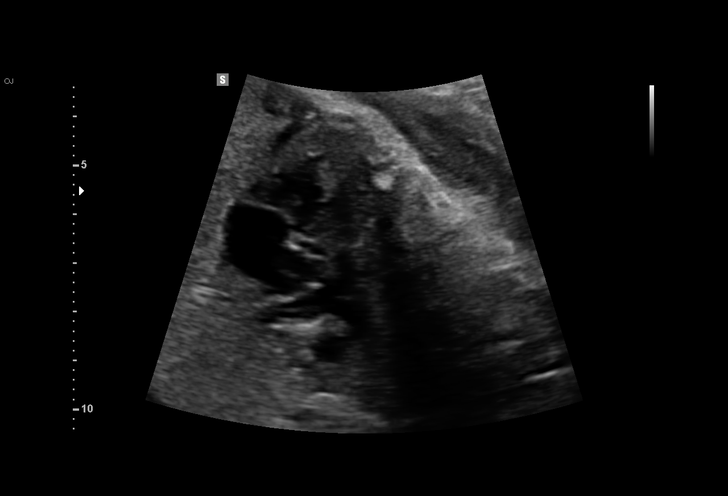
[im 23/33]
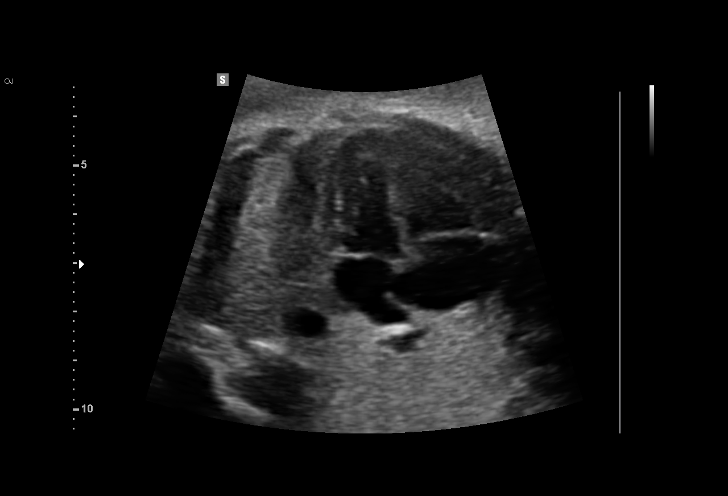
[im 25/33]
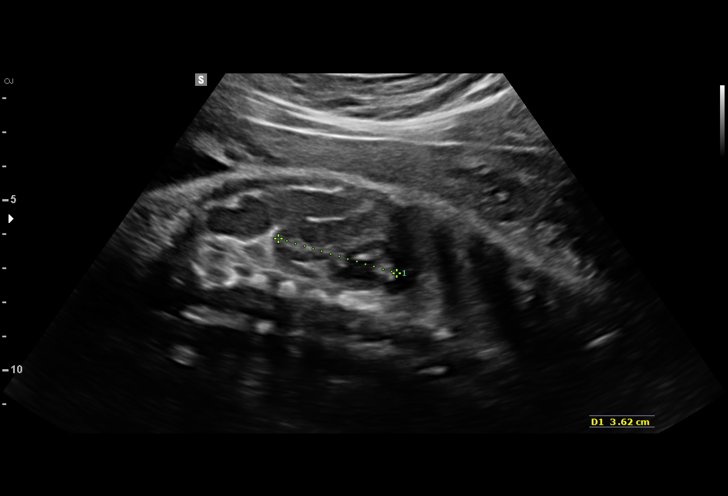
[im 28/33]
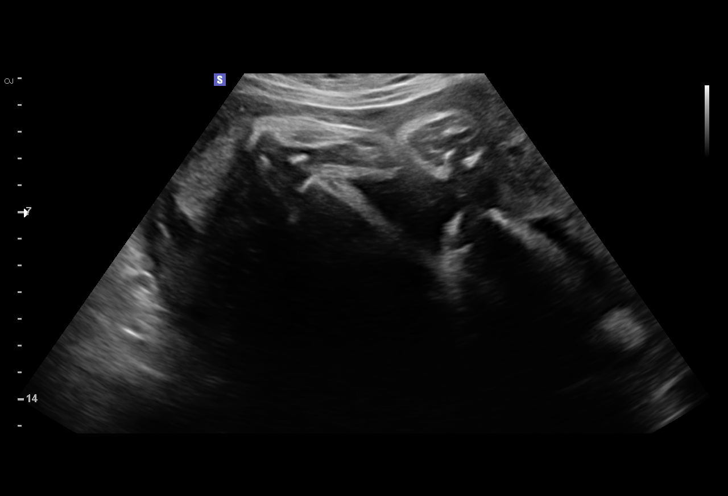
[im 30/33]
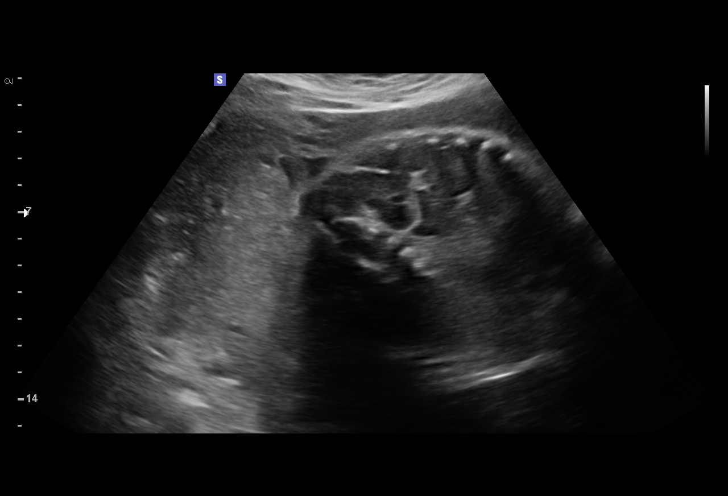
[im 33/33]
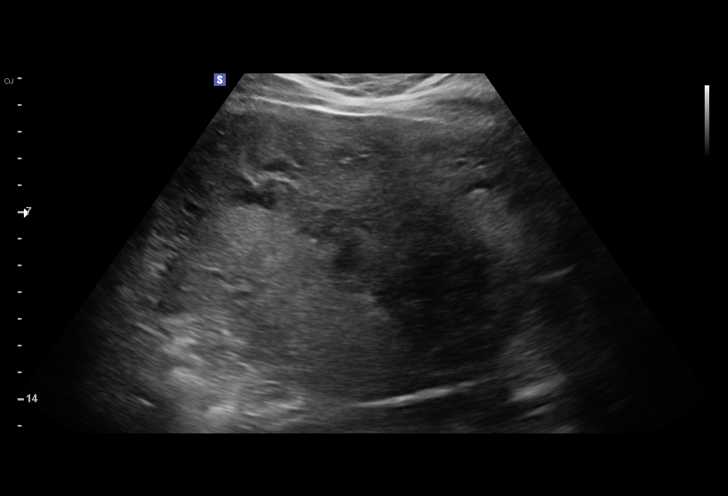

[15 of 28 positions shown; findings below may reference images not displayed]

MAU/Triage

1  SHIMPEI BAAH            [id number]      [id number]     [id number]
Indications

35 weeks gestation of pregnancy
Obesity complicating pregnancy, third
trimester
Poor obstetric history: Previous preterm
delivery (twins, 18 weeks then 24 weeks; 16
weeks; 34 weeks; 18 weeks)
Previous cesarean delivery, antepartum
Diabetes - Gestational (glucophage)
Maternal care for known or suspected poor
fetal growth, third trimester, not applicable or
unspecified
OB History

Blood Type:            Height:  5'4"   Weight (lb):  251       BMI:
Gravidity:    12        Term:   2        Prem:   2         SAB:   8
Living:       4
Fetal Evaluation

Num Of Fetuses:     1
Fetal Heart         149
Rate(bpm):
Cardiac Activity:   Observed
Presentation:       Cephalic
Placenta:           Posterior, above cervical os

Amniotic Fluid
AFI FV:      Subjectively within normal limits

AFI Sum(cm)     %Tile       Largest Pocket(cm)
7.99            7
RUQ(cm)       RLQ(cm)       LUQ(cm)        LLQ(cm)
1.77
Gestational Age

LMP:           35w 4d        Date:  02/04/15                 EDD:    11/11/15
Best:          35w 4d     Det. By:  LMP  (02/04/15)          EDD:    11/11/15
Impression

SIUP at 35+4 weeks
Cephalic presentation
Low normal amniotic fluid volume
NST reactive
Recommendations

Continue twice weekly NSTs with weekly AFIs
Growth US next week

## 2016-12-12 ENCOUNTER — Encounter (HOSPITAL_COMMUNITY): Payer: Self-pay | Admitting: Nurse Practitioner

## 2016-12-12 ENCOUNTER — Emergency Department (HOSPITAL_COMMUNITY)
Admission: EM | Admit: 2016-12-12 | Discharge: 2016-12-12 | Disposition: A | Discharge status: Home / Self Care | Payer: Medicaid Other | Attending: Emergency Medicine | Admitting: Emergency Medicine

## 2016-12-12 ENCOUNTER — Emergency Department (HOSPITAL_COMMUNITY): Payer: Medicaid Other

## 2016-12-12 DIAGNOSIS — Z5321 Procedure and treatment not carried out due to patient leaving prior to being seen by health care provider: Secondary | ICD-10-CM | POA: Insufficient documentation

## 2016-12-12 DIAGNOSIS — R079 Chest pain, unspecified: Secondary | ICD-10-CM | POA: Diagnosis not present

## 2016-12-12 LAB — BASIC METABOLIC PANEL
Anion gap: 10 (ref 5–15)
BUN: 11 mg/dL (ref 6–20)
CALCIUM: 9 mg/dL (ref 8.9–10.3)
CO2: 22 mmol/L (ref 22–32)
Chloride: 103 mmol/L (ref 101–111)
Creatinine, Ser: 0.9 mg/dL (ref 0.44–1.00)
GFR calc Af Amer: >60 mL/min (ref >60)
GLUCOSE: 114 mg/dL — AB (ref 65–99)
POTASSIUM: 3.1 mmol/L — AB (ref 3.5–5.1)
Sodium: 135 mmol/L (ref 135–145)

## 2016-12-12 LAB — I-STAT TROPONIN, ED: Troponin i, poc: 0 ng/mL (ref 0.00–0.08)

## 2016-12-12 LAB — CBC
HEMATOCRIT: 35.6 % — AB (ref 36.0–46.0)
Hemoglobin: 12.2 g/dL (ref 12.0–15.0)
MCH: 23.5 pg — AB (ref 26.0–34.0)
MCHC: 34.3 g/dL (ref 30.0–36.0)
MCV: 68.6 fL — ABNORMAL LOW (ref 78.0–100.0)
Platelets: 183 10*3/uL (ref 150–400)
RBC: 5.19 MIL/uL — ABNORMAL HIGH (ref 3.87–5.11)
RDW: 15.2 % (ref 11.5–15.5)
WBC: 12.5 10*3/uL — ABNORMAL HIGH (ref 4.0–10.5)

## 2016-12-12 NOTE — ED Triage Notes (Signed)
Pt sts since this morning she has had some episodes of chest pain that she attributed to indigestion initially that dissipated on her own. Patient states around 5:45pm pt has severe left-central chest pain that shot through to back and down left arm. Patient states pain was so intense she felt she might pass out- patient states it felt "like a giant bubble". Patient had some shortness of breath with intense pain. Pt took 2 ASA and stated pain dissipated shortly after. Pt denies symptoms presently.

## 2016-12-12 NOTE — ED Notes (Signed)
At desk requesting to leave.  Encouraged to stay.  States if it gets worse I will come back.

## 2017-06-08 ENCOUNTER — Encounter (HOSPITAL_COMMUNITY): Payer: Self-pay | Admitting: Emergency Medicine

## 2017-06-08 ENCOUNTER — Other Ambulatory Visit: Payer: Self-pay

## 2017-06-08 ENCOUNTER — Emergency Department (HOSPITAL_COMMUNITY)
Admission: EM | Admit: 2017-06-08 | Discharge: 2017-06-08 | Disposition: A | Discharge status: Home / Self Care | Payer: No Typology Code available for payment source | Attending: Physician Assistant | Admitting: Physician Assistant

## 2017-06-08 DIAGNOSIS — Z041 Encounter for examination and observation following transport accident: Secondary | ICD-10-CM | POA: Diagnosis present

## 2017-06-08 DIAGNOSIS — J45909 Unspecified asthma, uncomplicated: Secondary | ICD-10-CM | POA: Insufficient documentation

## 2017-06-08 DIAGNOSIS — Z87891 Personal history of nicotine dependence: Secondary | ICD-10-CM | POA: Diagnosis not present

## 2017-06-08 MED ORDER — CYCLOBENZAPRINE HCL 10 MG PO TABS: 10.0000 mg | ORAL_TABLET | Freq: Two times a day (BID) | ORAL | 0 refills | Status: DC | PRN

## 2017-06-08 MED ORDER — IBUPROFEN 200 MG PO TABS
600.0000 mg | ORAL_TABLET | Freq: Once | ORAL | Status: AC
Start: 2017-06-08 — End: 2017-06-08
  Administered 2017-06-08: 11:00:00 600 mg via ORAL
  Filled 2017-06-08: qty 1

## 2017-06-08 MED ORDER — ACETAMINOPHEN 500 MG PO TABS
500.0000 mg | ORAL_TABLET | Freq: Once | ORAL | Status: AC
  Administered 2017-06-08: 500 mg via ORAL
  Filled 2017-06-08: qty 1

## 2017-06-08 MED ORDER — IBUPROFEN 800 MG PO TABS: 800.0000 mg | ORAL_TABLET | Freq: Three times a day (TID) | ORAL | 0 refills | Status: DC

## 2017-06-08 NOTE — Discharge Instructions (Signed)
Please use ibuprofen and Tylenol to help with your pain.  In addition use this muscle relaxant.  Please return with any concerning symptoms.
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
## 2017-11-01 ENCOUNTER — Emergency Department (HOSPITAL_COMMUNITY)
Admission: EM | Admit: 2017-11-01 | Discharge: 2017-11-02 | Disposition: A | Discharge status: Home / Self Care | Payer: Medicaid Other | Attending: Emergency Medicine | Admitting: Emergency Medicine

## 2017-11-01 DIAGNOSIS — R0602 Shortness of breath: Secondary | ICD-10-CM | POA: Diagnosis not present

## 2017-11-01 DIAGNOSIS — J069 Acute upper respiratory infection, unspecified: Secondary | ICD-10-CM | POA: Diagnosis not present

## 2017-11-01 DIAGNOSIS — J4521 Mild intermittent asthma with (acute) exacerbation: Secondary | ICD-10-CM | POA: Diagnosis not present

## 2017-11-01 DIAGNOSIS — R000 Tachycardia, unspecified: Secondary | ICD-10-CM | POA: Diagnosis not present

## 2017-11-01 DIAGNOSIS — Z87891 Personal history of nicotine dependence: Secondary | ICD-10-CM | POA: Diagnosis not present

## 2017-11-01 DIAGNOSIS — R05 Cough: Secondary | ICD-10-CM | POA: Diagnosis not present

## 2017-11-01 DIAGNOSIS — Z79899 Other long term (current) drug therapy: Secondary | ICD-10-CM | POA: Insufficient documentation

## 2017-11-02 ENCOUNTER — Emergency Department (HOSPITAL_COMMUNITY): Payer: Medicaid Other

## 2017-11-02 ENCOUNTER — Other Ambulatory Visit: Payer: Self-pay

## 2017-11-02 DIAGNOSIS — R05 Cough: Secondary | ICD-10-CM | POA: Diagnosis not present

## 2017-11-02 DIAGNOSIS — R0602 Shortness of breath: Secondary | ICD-10-CM | POA: Diagnosis not present

## 2017-11-02 MED ORDER — FLUTICASONE PROPIONATE 50 MCG/ACT NA SUSP: 2.0000 | Freq: Every day | NASAL | 2 refills | Status: DC

## 2017-11-02 MED ORDER — AEROCHAMBER PLUS FLO-VU LARGE MISC
1.0000 | Freq: Once | Status: AC
  Administered 2017-11-02: 1

## 2017-11-02 MED ORDER — PREDNISONE 20 MG PO TABS
60.0000 mg | ORAL_TABLET | Freq: Once | ORAL | Status: AC
Start: 2017-11-02 — End: 2017-11-02
  Administered 2017-11-02: 60 mg via ORAL
  Filled 2017-11-02: qty 3

## 2017-11-02 MED ORDER — CETIRIZINE HCL 10 MG PO TABS: 10.0000 mg | ORAL_TABLET | Freq: Every day | ORAL | 1 refills | Status: DC

## 2017-11-02 MED ORDER — ALBUTEROL SULFATE HFA 108 (90 BASE) MCG/ACT IN AERS
2.0000 | INHALATION_SPRAY | RESPIRATORY_TRACT | Status: DC | PRN
  Administered 2017-11-02: 2 via RESPIRATORY_TRACT
  Filled 2017-11-02: qty 6.7

## 2017-11-02 MED ORDER — PREDNISONE 20 MG PO TABS: 40.0000 mg | ORAL_TABLET | Freq: Every day | ORAL | 0 refills | Status: DC

## 2017-11-02 MED ORDER — ALBUTEROL SULFATE HFA 108 (90 BASE) MCG/ACT IN AERS: 2.0000 | INHALATION_SPRAY | RESPIRATORY_TRACT | 3 refills | Status: DC | PRN

## 2017-11-02 MED ORDER — ALBUTEROL SULFATE (2.5 MG/3ML) 0.083% IN NEBU
5.0000 mg | INHALATION_SOLUTION | Freq: Once | RESPIRATORY_TRACT | Status: AC
  Administered 2017-11-02: 5 mg via RESPIRATORY_TRACT
  Filled 2017-11-02: qty 6

## 2017-11-02 NOTE — ED Triage Notes (Signed)
Patient c/o earache and bug bites yesterday that made her face swell (has been taking Benadryl and Tylenol) for this. Today after working in the dust and smoking a cig/cigar states she became SOB and began coughing today.

## 2017-11-02 NOTE — Discharge Instructions (Addendum)
1. Medications: albuterol, prednisone, flonase, usual home medications 2. Treatment: rest, drink plenty of fluids, begin OTC antihistamine (Zyrtec or Claritin) 3. Follow Up: Please followup with your primary doctor in 2-3 days for discussion of your diagnoses and further evaluation after today's visit; if you do not have a primary care doctor use the resource guide provided to find one; Please return to the ER for difficulty breathing, high fevers or worsening symptoms.

## 2017-11-02 NOTE — ED Provider Notes (Signed)
[facility] EMERGENCY DEPARTMENT Provider Note   CSN: [id number] Arrival date & time: 11/01/17  2355     History   Chief Complaint Chief Complaint  Patient presents with  . Asthma    HPI Kristen [name] is a 37 y.o. female with a hx of alpha thalassemia, asthma, endometriosis presents to the Emergency Department complaining of gradual, persistent, progressively worsening cough and shortness of breath onset 3-5 days ago.  Patient reports that she began with URI symptoms including nasal congestion, postnasal drip, cough and subjective fevers.  She reports taking cough and cold medicine without significant relief.  Additionally, patient reports that she started a new job 1.5 months ago and was exposed to a dusty environment several days ago.  She reports this worsened her shortness of breath.  Patient reports that she has previously used an albuterol inhaler however it is been several years since she needed it and therefore does not have one anymore.  She is never been hospitalized or intubated for her asthma.  Patient additionally reports that yesterday morning she awoke with several bug bites to her face and therefore she took Benadryl.  She reports this helped her symptoms some but tonight she had worsening cough and shortness of breath after smoking a cigar.  Patient reports she smokes 1 cigar each day.  She denies drug or alcohol usage.  She denies headache, neck pain, chest pain, fever, chills, abdominal pain, nausea, vomiting, diarrhea, weakness, dizziness, syncope.  Patient reports she was given an albuterol treatment at triage and she feels much better at this time.  She does report that smoking makes her shortness of breath worse.  The history is provided by the patient and medical records. No language interpreter was used.    Past Medical History:  Diagnosis Date  . Alpha thalassemia ([place])   . Asthma    rescue inhaler- last use May 2017  . Complication of  anesthesia   . Endometriosis   . Gestational diabetes    metformin and insulin  . Preterm labor with preterm delivery   . Tubular adenoma of colon     Patient Active Problem List   Diagnosis Date Noted  . 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 Morbid obesity with BMI of 40.0-44.9, adult ([place]) 10/22/2016  . Gestational diabetes mellitus (GDM) affecting fifth pregnancy 10/22/2016  . Unwanted fertility 09/17/2016  . Bowel trouble 02/07/2016  . Slow transit constipation 02/07/2016    Past Surgical History:  Procedure Laterality Date  . CESAREAN SECTION    . CESAREAN SECTION N/A 10/29/2015   Procedure: CESAREAN SECTION;  Surgeon: Woodroe Mode, MD;  Location: [facility];  Service: Obstetrics;  Laterality: N/A;  . DILATION AND CURETTAGE OF UTERUS       OB History    Gravida  12   Para  5   Term  3   Preterm  2   AB  8   Living  5     SAB  0   TAB  0   Ectopic  0   Multiple  1   Live Births  5            Home Medications    Prior to Admission medications   Medication Sig Start Date End Date Taking? Authorizing Provider  albuterol (PROVENTIL HFA;VENTOLIN HFA) 108 (90 Base) MCG/ACT inhaler Inhale 2 puffs into the lungs every 4 (four) hours as needed for wheezing or shortness of breath. 11/02/17   Tahj Njoku, Jarrett Soho, PA-C  cetirizine (ZYRTEC ALLERGY) 10 MG tablet Take 1 tablet (10 mg total) by mouth daily. 11/02/17   Florentino Laabs, Jarrett Soho, PA-C  cyclobenzaprine (FLEXERIL) 10 MG tablet Take 1 tablet (10 mg total) by mouth 2 (two) times daily as needed for muscle spasms. 06/08/17   Mackuen, Courteney Lyn, MD  fluticasone (FLONASE) 50 MCG/ACT nasal spray Place 2 sprays into both nostrils daily. 11/02/17   Isamar Wellbrock, Jarrett Soho, PA-C  ibuprofen (ADVIL,MOTRIN) 800 MG tablet Take 1 tablet (800 mg total) by mouth 3 (three) times daily. 06/08/17   Mackuen, Fredia Sorrow, MD  linaclotide (LINZESS) 145 MCG CAPS capsule Take 1 capsule (145 mcg total) by mouth daily before  breakfast. 03/19/16   Pyrtle, Lajuan Lines, MD  predniSONE (DELTASONE) 20 MG tablet Take 2 tablets (40 mg total) by mouth daily. 11/02/17   Tashema Tiller, Jarrett Soho, PA-C    Family History Family History  Problem Relation Age of Onset  . HIV Father   . Diabetes Maternal Grandmother   . Diabetes Paternal Grandmother   . Colon cancer Maternal Uncle   . Stomach cancer Neg Hx   . Rectal cancer Neg Hx   . Esophageal cancer Neg Hx   . Liver cancer Neg Hx     Social History Social History   Tobacco Use  . Smoking status: Former Smoker    Types: Cigars  . Smokeless tobacco: Never Used  . Tobacco comment: occ  Substance Use Topics  . Alcohol use: No    Alcohol/week: 0.0 standard drinks  . Drug use: No     Allergies   Doxycycline; Hydrocodone; Morphine and related; Penicillins; and Percocet [oxycodone-acetaminophen]   Review of Systems Review of Systems  Constitutional: Positive for fever ( Subjective, several days ago, not recurrent). Negative for appetite change, diaphoresis, fatigue and unexpected weight change.  HENT: Positive for congestion, postnasal drip, rhinorrhea and sinus pressure. Negative for mouth sores.   Eyes: Negative for visual disturbance.  Respiratory: Positive for cough, shortness of breath and wheezing. Negative for chest tightness.   Cardiovascular: Negative for chest pain.  Gastrointestinal: Negative for abdominal pain, constipation, diarrhea, nausea and vomiting.  Endocrine: Negative for polydipsia, polyphagia and polyuria.  Genitourinary: Negative for dysuria, frequency, hematuria and urgency.  Musculoskeletal: Negative for back pain and neck stiffness.  Skin: Negative for rash.  Allergic/Immunologic: Negative for immunocompromised state.  Neurological: Negative for syncope, light-headedness and headaches.  Hematological: Does not bruise/bleed easily.  Psychiatric/Behavioral: Negative for sleep disturbance. The patient is not nervous/anxious.      Physical  Exam Updated Vital Signs BP 114/61 (BP Location: Right Arm)   Pulse 92   Temp 98.2 F (36.8 C) (Oral)   Resp 19   Ht 5' 3.5" (1.613 m)   Wt 89.4 kg   LMP 10/31/2017 (Exact Date)   SpO2 97%   BMI 34.35 kg/m   Physical Exam  Constitutional: She appears well-developed and well-nourished. No distress.  HENT:  Head: Normocephalic and atraumatic.  Right Ear: Tympanic membrane, external ear and ear canal normal.  Left Ear: Tympanic membrane, external ear and ear canal normal.  Nose: Mucosal edema and rhinorrhea present. No epistaxis. Right sinus exhibits no maxillary sinus tenderness and no frontal sinus tenderness. Left sinus exhibits no maxillary sinus tenderness and no frontal sinus tenderness.  Mouth/Throat: Uvula is midline and mucous membranes are normal. Mucous membranes are not pale and not cyanotic. No oropharyngeal exudate, posterior oropharyngeal edema, posterior oropharyngeal erythema or tonsillar abscesses.  Eyes: Pupils are equal, round, and reactive to light. Conjunctivae  are normal.  Neck: Normal range of motion and full passive range of motion without pain.  Cardiovascular: Normal rate and intact distal pulses.  Pulmonary/Chest: Effort normal and breath sounds normal. No stridor.  Clear and equal breath sounds without focal wheezes, rhonchi, rales  Abdominal: Soft. There is no tenderness.  Musculoskeletal: Normal range of motion.  Lymphadenopathy:    She has no cervical adenopathy.  Neurological: She is alert.  Skin: Skin is warm and dry. No rash noted. She is not diaphoretic.  Several small bug bites to the right side of the face and left arm.  No significant edema.  No induration or fluctuance.  No evidence of secondary infection.  Psychiatric: She has a normal mood and affect.  Nursing note and vitals reviewed.    ED Treatments / Results   Radiology Dg Chest 2 View  Result Date: 11/02/2017 CLINICAL DATA:  Shortness of breath, productive cough, history asthma,  alpha-thalassemia EXAM: CHEST - 2 VIEW COMPARISON:  12/12/2016 FINDINGS: Normal heart size, mediastinal contours, and pulmonary vascularity. Minimal central peribronchial thickening. Lungs otherwise clear. No infiltrate, pleural effusion or pneumothorax. Osseous structures unremarkable. IMPRESSION: Peribronchial thickening consistent with patient's history of asthma. No acute infiltrate. Electronically Signed   By: Lavonia Dana M.D.   On: 11/02/2017 00:49    Procedures Procedures (including critical care time)  Medications Ordered in ED Medications  albuterol (PROVENTIL HFA;VENTOLIN HFA) 108 (90 Base) MCG/ACT inhaler 2 puff (has no administration in time range)  AEROCHAMBER PLUS FLO-VU LARGE MISC 1 each (has no administration in time range)  predniSONE (DELTASONE) tablet 60 mg (has no administration in time range)  albuterol (PROVENTIL) (2.5 MG/3ML) 0.083% nebulizer solution 5 mg (5 mg Nebulization Given 11/02/17 0017)     Initial Impression / Assessment and Plan / ED Course  I have reviewed the triage vital signs and the nursing notes.  Pertinent labs & imaging results that were available during my care of the patient were reviewed by me and considered in my medical decision making (see chart for details).  Clinical Course as of Nov 02 256  Mon Nov 02, 2017  0254 Patient initially tachycardic on arrival however this has resolved on my clinical exam.  Pulse Rate(!): 111 [HM]  0254 Afebrile  Temp: 98.6 F (37 C) [HM]  0254 BP improved after albuterol  BP: 114/61 [HM]    Clinical Course User Index [HM] Chue Berkovich, Jarrett Soho, PA-C    Presents with asthma exacerbation likely secondary to viral URI and dusty conditions at work.  At triage patient was tachycardic and I suspect wheezing as albuterol was given.  On my clinical exam she is without tachycardia.  Lung sounds are clear and equal.  Patient ambulated in ED with O2 saturations maintained >90, no current signs of respiratory  distress.  Albuterol MDI and prednisone given in the ED and pt will be discharged with 5 day burst. Pt states they are breathing at baseline.  Additionally, discussed role of allergy irritants with patient.  She will be placed on Zyrtec and Flonase.  Pt has been instructed to continue using prescribed medications and to speak with PCP about today's exacerbation.  Patient and husband state understanding and are in agreement with the plan.    Final Clinical Impressions(s) / ED Diagnoses   Final diagnoses:  Mild intermittent asthma with exacerbation  Viral upper respiratory tract infection    ED Discharge Orders         Ordered    albuterol (PROVENTIL HFA;VENTOLIN HFA)  108 (90 Base) MCG/ACT inhaler  Every 4 hours PRN     11/02/17 0257    fluticasone (FLONASE) 50 MCG/ACT nasal spray  Daily     11/02/17 0257    cetirizine (ZYRTEC ALLERGY) 10 MG tablet  Daily     11/02/17 0257    predniSONE (DELTASONE) 20 MG tablet  Daily     11/02/17 0257           Rahkeem Senft, Gwenlyn Perking 11/02/17 0258    Fatima Blank, MD 11/02/17 [phone]

## 2017-12-12 ENCOUNTER — Emergency Department (HOSPITAL_COMMUNITY): Payer: Medicaid Other

## 2017-12-12 ENCOUNTER — Emergency Department (HOSPITAL_COMMUNITY)
Admission: EM | Admit: 2017-12-12 | Discharge: 2017-12-12 | Disposition: A | Discharge status: Home / Self Care | Payer: Medicaid Other | Attending: Emergency Medicine | Admitting: Emergency Medicine

## 2017-12-12 ENCOUNTER — Encounter (HOSPITAL_COMMUNITY): Payer: Self-pay | Admitting: Emergency Medicine

## 2017-12-12 DIAGNOSIS — J45901 Unspecified asthma with (acute) exacerbation: Secondary | ICD-10-CM | POA: Insufficient documentation

## 2017-12-12 DIAGNOSIS — Z79899 Other long term (current) drug therapy: Secondary | ICD-10-CM | POA: Diagnosis not present

## 2017-12-12 DIAGNOSIS — J069 Acute upper respiratory infection, unspecified: Secondary | ICD-10-CM | POA: Diagnosis not present

## 2017-12-12 DIAGNOSIS — R0602 Shortness of breath: Secondary | ICD-10-CM | POA: Diagnosis not present

## 2017-12-12 DIAGNOSIS — Z87891 Personal history of nicotine dependence: Secondary | ICD-10-CM | POA: Diagnosis not present

## 2017-12-12 DIAGNOSIS — R05 Cough: Secondary | ICD-10-CM | POA: Diagnosis not present

## 2017-12-12 MED ORDER — IPRATROPIUM BROMIDE 0.02 % IN SOLN
0.5000 mg | Freq: Once | RESPIRATORY_TRACT | Status: AC
  Administered 2017-12-12: 0.5 mg via RESPIRATORY_TRACT
  Filled 2017-12-12: qty 2.5

## 2017-12-12 MED ORDER — ALBUTEROL SULFATE (2.5 MG/3ML) 0.083% IN NEBU
5.0000 mg | INHALATION_SOLUTION | Freq: Once | RESPIRATORY_TRACT | Status: AC
  Administered 2017-12-12: 5 mg via RESPIRATORY_TRACT
  Filled 2017-12-12: qty 6

## 2017-12-12 MED ORDER — PREDNISONE 20 MG PO TABS
40.0000 mg | ORAL_TABLET | Freq: Once | ORAL | Status: AC
Start: 2017-12-12 — End: 2017-12-12
  Administered 2017-12-12: 40 mg via ORAL
  Filled 2017-12-12: qty 2

## 2017-12-12 MED ORDER — PREDNISONE 20 MG PO TABS: 40.0000 mg | ORAL_TABLET | Freq: Every day | ORAL | 0 refills | Status: DC

## 2017-12-12 MED ORDER — BENZONATATE 100 MG PO CAPS: 100.0000 mg | ORAL_CAPSULE | Freq: Three times a day (TID) | ORAL | 0 refills | Status: DC

## 2017-12-12 NOTE — Discharge Instructions (Signed)
Please read and follow all provided instructions.  Your diagnoses today include:  1. Viral upper respiratory tract infection   2. Exacerbation of asthma, unspecified asthma severity, unspecified whether persistent     You appear to have an upper respiratory infection (URI). An upper respiratory tract infection, or cold, is a viral infection of the air passages leading to the lungs. It should improve gradually after 5-7 days. You may have a lingering cough that lasts for 2- 4 weeks after the infection.  Tests performed today include:  Vital signs. See below for your results today.   Chest x-ray-no signs of pneumonia  Medications prescribed:   Albuterol inhaler - medication that opens up your airway  Use inhaler as follows: 1-2 puffs with spacer every 4 hours as needed for wheezing, cough, or shortness of breath.    Tessalon Perles - cough suppressant medication   Prednisone - steroid medicine   It is best to take this medication in the morning to prevent sleeping problems. If you are diabetic, monitor your blood sugar closely and stop taking Prednisone if blood sugar is over 300. Take with food to prevent stomach upset.   Take any prescribed medications only as directed. Treatment for your infection is aimed at treating the symptoms. There are no medications, such as antibiotics, that will cure your infection.   Home care instructions:  Follow any educational materials contained in this packet.   Your illness is contagious and can be spread to others, especially during the first 3 or 4 days. It cannot be cured by antibiotics or other medicines. Take basic precautions such as washing your hands often, covering your mouth when you cough or sneeze, and avoiding public places where you could spread your illness to others.   Please continue drinking plenty of fluids.  Use over-the-counter medicines as needed as directed on packaging for symptom relief.  You may also use ibuprofen or  tylenol as directed on packaging for pain or fever.  Do not take multiple medicines containing Tylenol or acetaminophen to avoid taking too much of this medication.  Follow-up instructions: Please follow-up with your primary care provider in the next 3 days for further evaluation of your symptoms if you are not feeling better.   Return instructions:   Please return to the Emergency Department if you experience worsening symptoms.   RETURN IMMEDIATELY IF you develop shortness of breath, confusion or altered mental status, a new rash, become dizzy, faint, or poorly responsive, or are unable to be cared for at home.  Please return if you have persistent vomiting and cannot keep down fluids or develop a fever that is not controlled by tylenol or motrin.    Please return if you have any other emergent concerns.  Additional Information:  Your vital signs today were: BP 124/85 (BP Location: Right Arm)    Pulse 88    Temp 97.9 F (36.6 C) (Oral)    Resp 18    LMP 11/30/2017    SpO2 100%  If your blood pressure (BP) was elevated above 135/85 this visit, please have this repeated by your doctor within one month. --------------

## 2017-12-12 NOTE — ED Notes (Signed)
Patient transported to X-ray 

## 2017-12-12 NOTE — ED Triage Notes (Signed)
Pt reports cough with yellow sputum and nasal congestion x days. More frequent use of inhaler at home, hx of asthma since childhood. VSS. Pt in no distress.

## 2017-12-12 NOTE — ED Provider Notes (Signed)
[facility] EMERGENCY DEPARTMENT Provider Note   CSN: [id number] Arrival date & time: 12/12/17  1011     History   Chief Complaint Chief Complaint  Patient presents with  . URI    HPI Genoveva Singleton is a 37 y.o. female.  Patient with history of asthma presents with upper respiratory infection symptoms and shortness of breath over the past 2 days.  Patient states that she began having some congestion 2 days ago which has progressed into a productive cough.  She denies any fever, nausea or vomiting.  Today she needed to use her inhaler twice in the.  Of an hour and she did not want to wait until things got worse before she was checked.  She currently complains of some chest tightness.  She does report some right-sided ear pressure.  No significant sore throat.  Patient states that she used to be on controller medication for asthma but was able to come off after getting allergy shots.  No known sick contacts.  She has not been hospitalized for her asthma since she was in her early 37s.     Past Medical History:  Diagnosis Date  . Alpha thalassemia ([place])   . Asthma    rescue inhaler- last use May 2017  . Complication of anesthesia   . Endometriosis   . Gestational diabetes    metformin and insulin  . Preterm labor with preterm delivery   . Tubular adenoma of colon     Patient Active Problem List   Diagnosis Date Noted  . 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 Morbid obesity with BMI of 40.0-44.9, adult ([place]) 10/22/2016  . Gestational diabetes mellitus (GDM) affecting fifth pregnancy 10/22/2016  . Unwanted fertility 09/17/2016  . Bowel trouble 02/07/2016  . Slow transit constipation 02/07/2016    Past Surgical History:  Procedure Laterality Date  . CESAREAN SECTION    . CESAREAN SECTION N/A 10/29/2015   Procedure: CESAREAN SECTION;  Surgeon: Woodroe Mode, MD;  Location: [facility];  Service: Obstetrics;  Laterality: N/A;  . DILATION AND  CURETTAGE OF UTERUS       OB History    Gravida  12   Para  5   Term  3   Preterm  2   AB  8   Living  5     SAB  0   TAB  0   Ectopic  0   Multiple  1   Live Births  5            Home Medications    Prior to Admission medications   Medication Sig Start Date End Date Taking? Authorizing Provider  albuterol (PROVENTIL HFA;VENTOLIN HFA) 108 (90 Base) MCG/ACT inhaler Inhale 2 puffs into the lungs every 4 (four) hours as needed for wheezing or shortness of breath. 11/02/17   Muthersbaugh, Jarrett Soho, PA-C  cetirizine (ZYRTEC ALLERGY) 10 MG tablet Take 1 tablet (10 mg total) by mouth daily. 11/02/17   Muthersbaugh, Jarrett Soho, PA-C  cyclobenzaprine (FLEXERIL) 10 MG tablet Take 1 tablet (10 mg total) by mouth 2 (two) times daily as needed for muscle spasms. 06/08/17   Mackuen, Courteney Lyn, MD  fluticasone (FLONASE) 50 MCG/ACT nasal spray Place 2 sprays into both nostrils daily. 11/02/17   Muthersbaugh, Jarrett Soho, PA-C  ibuprofen (ADVIL,MOTRIN) 800 MG tablet Take 1 tablet (800 mg total) by mouth 3 (three) times daily. 06/08/17   Mackuen, Courteney Lyn, MD  linaclotide (LINZESS) 145 MCG CAPS capsule Take 1 capsule (145  mcg total) by mouth daily before breakfast. 03/19/16   Pyrtle, Lajuan Lines, MD  predniSONE (DELTASONE) 20 MG tablet Take 2 tablets (40 mg total) by mouth daily. 11/02/17   Muthersbaugh, Jarrett Soho, PA-C    Family History Family History  Problem Relation Age of Onset  . HIV Father   . Diabetes Maternal Grandmother   . Diabetes Paternal Grandmother   . Colon cancer Maternal Uncle   . Stomach cancer Neg Hx   . Rectal cancer Neg Hx   . Esophageal cancer Neg Hx   . Liver cancer Neg Hx     Social History Social History   Tobacco Use  . Smoking status: Former Smoker    Types: Cigars  . Smokeless tobacco: Never Used  . Tobacco comment: occ  Substance Use Topics  . Alcohol use: No    Alcohol/week: 0.0 standard drinks  . Drug use: No     Allergies   Doxycycline; Hydrocodone;  Morphine and related; Penicillins; and Percocet [oxycodone-acetaminophen]   Review of Systems Review of Systems  Constitutional: Negative for fever.  HENT: Positive for congestion, ear pain and rhinorrhea. Negative for sore throat.   Eyes: Negative for redness.  Respiratory: Positive for cough, chest tightness and shortness of breath.   Cardiovascular: Negative for chest pain.  Gastrointestinal: Negative for abdominal pain, diarrhea, nausea and vomiting.  Genitourinary: Negative for dysuria.  Musculoskeletal: Negative for myalgias.  Skin: Negative for rash.  Neurological: Negative for headaches.     Physical Exam Updated Vital Signs BP 124/85 (BP Location: Right Arm)   Pulse 88   Temp 97.9 F (36.6 C) (Oral)   Resp 18   LMP 11/30/2017   SpO2 100%   Physical Exam  Constitutional: She appears well-developed and well-nourished.  HENT:  Head: Normocephalic and atraumatic.  Right Ear: Tympanic membrane, external ear and ear canal normal.  Left Ear: Tympanic membrane, external ear and ear canal normal.  Nose: Mucosal edema present. No rhinorrhea.  Mouth/Throat: Uvula is midline, oropharynx is clear and moist and mucous membranes are normal. Mucous membranes are not dry. No oral lesions. No trismus in the jaw. No uvula swelling. No oropharyngeal exudate, posterior oropharyngeal edema, posterior oropharyngeal erythema or tonsillar abscesses.  Eyes: Conjunctivae are normal. Right eye exhibits no discharge. Left eye exhibits no discharge.  Neck: Normal range of motion. Neck supple.  Cardiovascular: Normal rate, regular rhythm and normal heart sounds.  Pulmonary/Chest: Effort normal and breath sounds normal. No respiratory distress. She has no wheezes. She has no rales.  Abdominal: Soft. There is no tenderness.  Lymphadenopathy:    She has no cervical adenopathy.  Neurological: She is alert.  Skin: Skin is warm and dry.  Psychiatric: She has a normal mood and affect.  Nursing note  and vitals reviewed.    ED Treatments / Results  Labs (all labs ordered are listed, but only abnormal results are displayed) Labs Reviewed - No data to display  EKG None  Radiology No results found.  Procedures Procedures (including critical care time)  Medications Ordered in ED Medications  albuterol (PROVENTIL) (2.5 MG/3ML) 0.083% nebulizer solution 5 mg (has no administration in time range)  ipratropium (ATROVENT) nebulizer solution 0.5 mg (has no administration in time range)  predniSONE (DELTASONE) tablet 40 mg (has no administration in time range)     Initial Impression / Assessment and Plan / ED Course  I have reviewed the triage vital signs and the nursing notes.  Pertinent labs & imaging results that were available  during my care of the patient were reviewed by me and considered in my medical decision making (see chart for details).     Patient seen and examined.  She appears well, comfortable, no respiratory distress no accessory muscle use.  Initial impression is that patient has upper respiratory infection with some exacerbation of her asthma.  Vital signs reviewed and are as follows: BP 124/85 (BP Location: Right Arm)   Pulse 88   Temp 97.9 F (36.6 C) (Oral)   Resp 18   LMP 11/30/2017   SpO2 100%   X-ray ordered by nursing protocol.  Will ensure no pneumonia.  Patient does not have any significant wheezing but does report some chest tightness.  Offered breathing treatment and she would like to have one.  Patient will need steroid burst to control her asthma in setting of this infection.  Will give first dose of prednisone.   11:56 AM chest x-ray negative.  Patient informed.  She is doing well after her breathing treatment.  Lungs are clear and she has good air movement.  Plan at this time is to discharge home with every 4 hour albuterol from her inhaler over the next 24 hours.  She will be given prednisone and Tessalon for symptom control.  Discussed  return to the emergency department with worsening shortness of breath, trouble breathing, fever, new symptoms or other concerns.  She verbalizes understanding agrees with plan.  Final Clinical Impressions(s) / ED Diagnoses   Final diagnoses:  Viral upper respiratory tract infection  Exacerbation of asthma, unspecified asthma severity, unspecified whether persistent   Patient with symptoms consistent with a viral syndrome.  Her asthma is exacerbated due to her infection.  Chest x-ray is clear.  Symptoms are well controlled.  She has albuterol at home.  Will start on prednisone.  Vitals are stable, no fever. No signs of dehydration. Supportive therapy indicated with return if symptoms worsen.     ED Discharge Orders         Ordered    predniSONE (DELTASONE) 20 MG tablet  Daily     12/12/17 1155    benzonatate (TESSALON) 100 MG capsule  Every 8 hours     12/12/17 1155           Carlisle Cater, PA-C 12/12/17 [place], [place], DO 12/12/17 1930

## 2017-12-12 NOTE — ED Notes (Signed)
Patient returned back from X-Ray.

## 2017-12-12 NOTE — ED Notes (Signed)
ED Provider at bedside. 

## 2018-03-17 ENCOUNTER — Ambulatory Visit (INDEPENDENT_AMBULATORY_CARE_PROVIDER_SITE_OTHER): Payer: Medicaid Other | Admitting: Internal Medicine

## 2018-03-17 ENCOUNTER — Other Ambulatory Visit: Payer: Self-pay

## 2018-03-17 ENCOUNTER — Encounter: Payer: Self-pay | Admitting: Internal Medicine

## 2018-03-17 ENCOUNTER — Ambulatory Visit
Admission: RE | Admit: 2018-03-17 | Discharge: 2018-03-17 | Disposition: A | Discharge status: Home / Self Care | Payer: Medicaid Other | Source: Ambulatory Visit | Attending: Internal Medicine | Admitting: Internal Medicine

## 2018-03-17 VITALS — BP 122/84 | HR 84 | Temp 97.8°F | Resp 17 | Ht 63.5 in | Wt 191.2 lb

## 2018-03-17 DIAGNOSIS — R05 Cough: Secondary | ICD-10-CM

## 2018-03-17 DIAGNOSIS — F331 Major depressive disorder, recurrent, moderate: Secondary | ICD-10-CM

## 2018-03-17 DIAGNOSIS — R062 Wheezing: Secondary | ICD-10-CM | POA: Diagnosis not present

## 2018-03-17 DIAGNOSIS — R059 Cough, unspecified: Secondary | ICD-10-CM

## 2018-03-17 DIAGNOSIS — R042 Hemoptysis: Secondary | ICD-10-CM

## 2018-03-17 DIAGNOSIS — Z720 Tobacco use: Secondary | ICD-10-CM

## 2018-03-17 MED ORDER — BUPROPION HCL 100 MG PO TABS: 100.0000 mg | ORAL_TABLET | Freq: Two times a day (BID) | ORAL | 0 refills | Status: DC

## 2018-03-17 MED ORDER — IPRATROPIUM-ALBUTEROL 0.5-2.5 (3) MG/3ML IN SOLN
3.0000 mL | Freq: Once | RESPIRATORY_TRACT | Status: AC
  Administered 2018-03-17: 3 mL via RESPIRATORY_TRACT

## 2018-03-17 NOTE — Progress Notes (Signed)
Subjective:     Patient ID: Kristen [name] , female    DOB: [birth year] , 38 y.o.   MRN: [id number]   Chief Complaint  Patient presents with  . fever, cold symptoms and asthma issues x 3 days    fevers-highest 101, taking cold/flu med is helping.  Night sweats and coughing up yellow mucus with blood.  No flu vaccine this year.  Pt c/o numbness in feet, laceration to feet and is unsure how it happened.  Per pt has not had diabetes checked since her last visit here and is concerned.  . Depression    score 15    HPI  Onset of ST, R ear pain and cough 3 days ago. Tylenol cold helped the R ear pain. Has been coughing up with yellow mucous and some blood tinge mixed in it. She is an occasional smoker.  Fever has been up to 101 the first 48h. Has been having worse night sweats. Denies body aches all over.   2- States her mother-in-law died 02-27-18 and has been feeling depressed. Has past hx of depression and used to take Wellbutrin, seraquil for sleep and another medication she does not recall. This was prescribed by her prior PCP for post partum depression. Last time she took antidepressants was 3 years and she was wined off them. Did not have post partum depression with her last pregnancy 2 years ago.   Past Medical History:  Diagnosis Date  . Alpha thalassemia ([place])   . Asthma    rescue inhaler- last use May 2017  . Complication of anesthesia   . Endometriosis   . Gestational diabetes    metformin and insulin  . Preterm labor with preterm delivery   . Tubular adenoma of colon      Family History  Problem Relation Age of Onset  . HIV Father   . Diabetes Maternal Grandmother   . Diabetes Paternal Grandmother   . Colon cancer Maternal Uncle   . Stomach cancer Neg Hx   . Rectal cancer Neg Hx   . Esophageal cancer Neg Hx   . Liver cancer Neg Hx      Current Outpatient Medications:  .  albuterol (PROVENTIL HFA;VENTOLIN HFA) 108 (90 Base) MCG/ACT inhaler, Inhale 2 puffs  into the lungs every 4 (four) hours as needed for wheezing or shortness of breath., Disp: 1 Inhaler, Rfl: 3 .  benzonatate (TESSALON) 100 MG capsule, Take 1 capsule (100 mg total) by mouth every 8 (eight) hours. (Patient not taking: Reported on 03/17/2018), Disp: 15 capsule, Rfl: 0 .  cetirizine (ZYRTEC ALLERGY) 10 MG tablet, Take 1 tablet (10 mg total) by mouth daily. (Patient not taking: Reported on 03/17/2018), Disp: 30 tablet, Rfl: 1 .  cyclobenzaprine (FLEXERIL) 10 MG tablet, Take 1 tablet (10 mg total) by mouth 2 (two) times daily as needed for muscle spasms. (Patient not taking: Reported on 03/17/2018), Disp: 10 tablet, Rfl: 0 .  fluticasone (FLONASE) 50 MCG/ACT nasal spray, Place 2 sprays into both nostrils daily. (Patient not taking: Reported on 03/17/2018), Disp: 9.9 g, Rfl: 2 .  ibuprofen (ADVIL,MOTRIN) 800 MG tablet, Take 1 tablet (800 mg total) by mouth 3 (three) times daily. (Patient not taking: Reported on 03/17/2018), Disp: 21 tablet, Rfl: 0 .  linaclotide (LINZESS) 145 MCG CAPS capsule, Take 1 capsule (145 mcg total) by mouth daily before breakfast. (Patient not taking: Reported on 03/17/2018), Disp: 30 capsule, Rfl: 3 .  predniSONE (DELTASONE) 20 MG tablet, Take 2 tablets (40 mg  total) by mouth daily. (Patient not taking: Reported on 03/17/2018), Disp: 8 tablet, Rfl: 0   Allergies  Allergen Reactions  . Doxycycline Hives and Shortness Of Breath  . Hydrocodone Shortness Of Breath  . Morphine And Related Shortness Of Breath and Itching    Burning  . Penicillins Hives    Has patient had a PCN reaction causing immediate rash, facial/tongue/throat swelling, SOB or lightheadedness with hypotension: Yes Has patient had a PCN reaction causing severe rash involving mucus membranes or skin necrosis: Yes Has patient had a PCN reaction that required hospitalization: No Has patient had a PCN reaction occurring within the last 10 years: Yes If all of the above answers are "NO", then may proceed with  Cephalosporin use.   Marland Kitchen Percocet [Oxycodone-Acetaminophen] Anaphylaxis    Pt has taken hydromorphone without complications in the past     Review of Systems  HENT: Positive for ear pain. Negative for ear discharge, rhinorrhea, sinus pressure, sinus pain and sore throat.   Respiratory: Positive for cough and wheezing. Negative for shortness of breath.   Cardiovascular: Negative for chest pain.  Gastrointestinal: Negative for diarrhea, nausea and vomiting.  Psychiatric/Behavioral: Positive for sleep disturbance.       Feels depressed since her mother in law died Feb 26, 2018.  Gets 4-5h of sleep only. Has been crying every day which is unusual for her.  Denies suicide thoughts      Today's Vitals   03/17/18 1206  BP: 122/84  Pulse: 84  Resp: 17  Temp: 97.8 F (36.6 C)  TempSrc: Oral  SpO2: 95%  Weight: 191 lb 3.2 oz (86.7 kg)  Height: 5' 3.5" (1.613 m)   Body mass index is 33.34 kg/m.   Objective:  Physical Exam Vitals signs and nursing note reviewed.  Constitutional:      Appearance: Normal appearance.  HENT:     Head: Normocephalic.     Right Ear: Ear canal and external ear normal.     Left Ear: Ear canal and external ear normal.     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     Mouth/Throat:     Mouth: Mucous membranes are moist.     Pharynx: Oropharynx is clear.  Eyes:     General: No scleral icterus.    Extraocular Movements: Extraocular movements intact.     Conjunctiva/sclera: Conjunctivae normal.     Pupils: Pupils are equal, round, and reactive to light.  Neck:     Musculoskeletal: Neck supple. No neck rigidity or muscular tenderness.     Vascular: No carotid bruit.  Cardiovascular:     Rate and Rhythm: Normal rate and regular rhythm.     Heart sounds: No murmur.  Pulmonary:     Effort: Pulmonary effort is normal.     Breath sounds: Normal breath sounds.  Musculoskeletal: Normal range of motion.   Lymphadenopathy:     Cervical: No cervical adenopathy.  Skin:    General: Skin is warm and dry.  Neurological:     Mental Status: She is alert and oriented to person, place, and time.  Psychiatric:        Behavior: Behavior normal.        Thought Content: Thought content normal.        Judgment: Judgment normal.     Comments: Has sad mood     Assessment And Plan:    1. Cough- acute - ipratropium-albuterol (  DUONEB) 0.5-2.5 (3) MG/3ML nebulizer solution 3 mL - DG Chest 2 View; Future Post neb pulse ox improved to 98% 2. Wheezing- acute - ipratropium-albuterol (DUONEB) 0.5-2.5 (3) MG/3ML nebulizer solution 3 mL - DG Chest 2 View; Future  3. Hemoptysis- may be coming from her nose, but  Since she is a smoker I sent her to get xray.  - DG Chest 2 View; Future  4. Depression, major, recurrent, moderate ([place])- I placed her on Wellbutrin 100 mg qd and needs to Fu with Janece in 2-3 weeks.   In the mean time I placed her on Prednisone for 5 days as noted,     Zaidee Rion RODRIGUEZ-SOUTHWORTH, PA-C

## 2018-03-17 NOTE — Patient Instructions (Signed)
° ° ° °  If you have lab work done today you will be contacted with your lab results within the next 2 weeks.  If you have not heard from us then please contact us. The fastest way to get your results is to register for My Chart. ° ° °IF you received an x-ray today, you will receive an invoice from [facility]. Please contact [facility] at [phone] with questions or concerns regarding your invoice.  ° °IF you received labwork today, you will receive an invoice from [facility]. Please contact [facility] at [phone] with questions or concerns regarding your invoice.  ° °Our billing staff will not be able to assist you with questions regarding bills from these companies. ° °You will be contacted with the lab results as soon as they are available. The fastest way to get your results is to activate your My Chart account. Instructions are located on the last page of this paperwork. If you have not heard from us regarding the results in 2 weeks, please contact this office. °  ° ° ° °

## 2018-03-18 ENCOUNTER — Other Ambulatory Visit: Payer: Self-pay | Admitting: Internal Medicine

## 2018-03-18 MED ORDER — FLUTICASONE PROPIONATE 50 MCG/ACT NA SUSP: 2.0000 | Freq: Every day | NASAL | 2 refills | Status: DC

## 2018-03-18 MED ORDER — ALBUTEROL SULFATE HFA 108 (90 BASE) MCG/ACT IN AERS: 2.0000 | INHALATION_SPRAY | RESPIRATORY_TRACT | 3 refills | Status: DC | PRN

## 2018-03-18 MED ORDER — CLARITHROMYCIN 500 MG PO TABS: 500.0000 mg | ORAL_TABLET | Freq: Two times a day (BID) | ORAL | 0 refills | Status: AC

## 2018-03-18 MED ORDER — FLUTICASONE-SALMETEROL 100-50 MCG/DOSE IN AEPB: 1.0000 | INHALATION_SPRAY | Freq: Two times a day (BID) | RESPIRATORY_TRACT | 2 refills | Status: DC

## 2018-03-18 NOTE — Progress Notes (Signed)
I called pt to check on her and she states her chest is feeling a little better, her R neck gland is tender and the nose secretions are thinning with the saline nose rinses, but is stuffy on the R side. Has not used the Flonase yet. The mucous is still yellow with some tinge of blood. Has not had a fever since I saw her yesterday. I sent Biaxen for her. I reviewed her CXR which is neg.

## 2018-04-07 ENCOUNTER — Ambulatory Visit: Payer: Medicaid Other | Admitting: Nurse Practitioner

## 2018-04-07 ENCOUNTER — Emergency Department (HOSPITAL_COMMUNITY)
Admission: EM | Admit: 2018-04-07 | Discharge: 2018-04-08 | Disposition: A | Discharge status: Home / Self Care | Payer: Medicaid Other | Attending: Emergency Medicine | Admitting: Emergency Medicine

## 2018-04-07 ENCOUNTER — Emergency Department (HOSPITAL_COMMUNITY): Payer: Medicaid Other

## 2018-04-07 DIAGNOSIS — G43B0 Ophthalmoplegic migraine, not intractable: Secondary | ICD-10-CM | POA: Diagnosis not present

## 2018-04-07 DIAGNOSIS — J45909 Unspecified asthma, uncomplicated: Secondary | ICD-10-CM | POA: Insufficient documentation

## 2018-04-07 DIAGNOSIS — R51 Headache: Secondary | ICD-10-CM | POA: Diagnosis not present

## 2018-04-07 DIAGNOSIS — G43109 Migraine with aura, not intractable, without status migrainosus: Secondary | ICD-10-CM

## 2018-04-07 DIAGNOSIS — Z79899 Other long term (current) drug therapy: Secondary | ICD-10-CM | POA: Insufficient documentation

## 2018-04-07 DIAGNOSIS — M79601 Pain in right arm: Secondary | ICD-10-CM | POA: Insufficient documentation

## 2018-04-07 LAB — COMPREHENSIVE METABOLIC PANEL
ALT: 14 U/L (ref 0–44)
AST: 17 U/L (ref 15–41)
Albumin: 3.5 g/dL (ref 3.5–5.0)
Alkaline Phosphatase: 52 U/L (ref 38–126)
Anion gap: 10 (ref 5–15)
BUN: 9 mg/dL (ref 6–20)
CHLORIDE: 108 mmol/L (ref 98–111)
CO2: 24 mmol/L (ref 22–32)
CREATININE: 0.97 mg/dL (ref 0.44–1.00)
Calcium: 8.5 mg/dL — ABNORMAL LOW (ref 8.9–10.3)
GFR calc Af Amer: >60 mL/min (ref >60)
GFR calc non Af Amer: >60 mL/min (ref >60)
Glucose, Bld: 92 mg/dL (ref 70–99)
Potassium: 3.8 mmol/L (ref 3.5–5.1)
Sodium: 142 mmol/L (ref 135–145)
Total Bilirubin: 0.3 mg/dL (ref 0.3–1.2)
Total Protein: 6.6 g/dL (ref 6.5–8.1)

## 2018-04-07 LAB — CBC
HCT: 35.8 % — ABNORMAL LOW (ref 36.0–46.0)
Hemoglobin: 11.8 g/dL — ABNORMAL LOW (ref 12.0–15.0)
MCH: 23.9 pg — AB (ref 26.0–34.0)
MCHC: 33 g/dL (ref 30.0–36.0)
MCV: 72.6 fL — AB (ref 80.0–100.0)
Platelets: 150 10*3/uL (ref 150–400)
RBC: 4.93 MIL/uL (ref 3.87–5.11)
RDW: 15.1 % (ref 11.5–15.5)
WBC: 11.5 10*3/uL — ABNORMAL HIGH (ref 4.0–10.5)
nRBC: 0 % (ref 0.0–0.2)

## 2018-04-07 LAB — APTT: aPTT: 33 seconds (ref 24–36)

## 2018-04-07 LAB — DIFFERENTIAL
Abs Immature Granulocytes: 0.04 10*3/uL (ref 0.00–0.07)
BASOS PCT: 0 %
Basophils Absolute: 0.1 10*3/uL (ref 0.0–0.1)
Eosinophils Absolute: 0.1 10*3/uL (ref 0.0–0.5)
Eosinophils Relative: 1 %
Immature Granulocytes: 0 %
Lymphocytes Relative: 31 %
Lymphs Abs: 3.6 10*3/uL (ref 0.7–4.0)
Monocytes Absolute: 0.8 10*3/uL (ref 0.1–1.0)
Monocytes Relative: 7 %
NEUTROS ABS: 6.9 10*3/uL (ref 1.7–7.7)
Neutrophils Relative %: 61 %

## 2018-04-07 LAB — PROTIME-INR
INR: 1
Prothrombin Time: 13.1 seconds (ref 11.4–15.2)

## 2018-04-07 LAB — I-STAT BETA HCG BLOOD, ED (MC, WL, AP ONLY): I-stat hCG, quantitative: <5 m[IU]/mL (ref <5)

## 2018-04-07 MED ORDER — SODIUM CHLORIDE 0.9% FLUSH
3.0000 mL | Freq: Once | INTRAVENOUS | Status: DC
Start: 2018-04-07 — End: 2018-04-08

## 2018-04-07 NOTE — ED Triage Notes (Signed)
Pt reports HA onset this AM with pain radiating into R arm. Also reports numbness but sensory is intact upon assessment. NIHSS 0.

## 2018-04-08 MED ORDER — METOCLOPRAMIDE HCL 10 MG PO TABS
10.0000 mg | ORAL_TABLET | Freq: Once | ORAL | Status: AC
  Administered 2018-04-08: 10 mg via ORAL
  Filled 2018-04-08: qty 1

## 2018-04-08 NOTE — ED Notes (Signed)
Pt not seen in lobby at this time.

## 2018-04-08 NOTE — Discharge Instructions (Addendum)
Your work-up in the emergency department was reassuring.  We recommend that you continue use of Tylenol, ibuprofen, or Aleve for management of pain.  Follow-up with a neurologist for further evaluation of symptoms.  You may also continue follow-up with your primary care doctor as scheduled.

## 2018-04-08 NOTE — ED Notes (Signed)
Patient verbalizes understanding of medications and discharge instructions. No further questions at this time. VSS and patient ambulatory at discharge.   

## 2018-04-08 NOTE — ED Provider Notes (Signed)
[facility]ERGENC[facility] DEPARTMENT Provider Note   CSN: [id number] Arrival date & time: 04/07/18  2110    History   Chief Complaint Chief Complaint  Patient presents with  . Headache    HPI Kristen [name] is a 38 y.o. female.  38 year old female presents to the emergency department for evaluation of right arm pain.  She states that she has been experiencing an aching pain in her arm intermittently for the past few months.  States that pain originates in her right hand and will radiate proximally to her shoulder.  Pain noted to improve with massaging of her R shoulder.  Over the past 2 days, patient feels that the pain has been worse.  When the pain radiates to her shoulder, she has noticed an aching, throbbing discomfort behind her right eye.  She has had improvement in her symptoms with Aleve.  Notes some sensitivity to light as well as nausea.  Denies any recent head injury or trauma.  No associated fevers, syncope, blurry vision or vision loss, tinnitus or hearing loss, new or worsening lower extremity numbness or paresthesias.  The history is provided by the patient. No language interpreter was used.  Headache    Past Medical History:  Diagnosis Date  . Alpha thalassemia ([place])   . Asthma    rescue inhaler- last use May 2017  . Complication of anesthesia   . Endometriosis   . Gestational diabetes    metformin and insulin  . Preterm labor with preterm delivery   . Tubular adenoma of colon     Patient Active Problem List   Diagnosis Date Noted  . 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 Morbid obesity with BMI of 40.0-44.9, adult ([place]) 10/22/2016  . Gestational diabetes mellitus (GDM) affecting fifth pregnancy 10/22/2016  . Unwanted fertility 09/17/2016  . Bowel trouble 02/07/2016  . Slow transit constipation 02/07/2016    Past Surgical History:  Procedure Laterality Date  . CESAREAN SECTION    . CESAREAN SECTION N/A 10/29/2015   Procedure:  CESAREAN SECTION;  Surgeon: Woodroe Mode, MD;  Location: [facility];  Service: Obstetrics;  Laterality: N/A;  . DILATION AND CURETTAGE OF UTERUS       OB History    Gravida  12   Para  5   Term  3   Preterm  2   AB  8   Living  5     SAB  0   TAB  0   Ectopic  0   Multiple  1   Live Births  5            Home Medications    Prior to Admission medications   Medication Sig Start Date End Date Taking? Authorizing Provider  albuterol (PROVENTIL HFA;VENTOLIN HFA) 108 (90 Base) MCG/ACT inhaler Inhale 2 puffs into the lungs every 4 (four) hours as needed for wheezing or shortness of breath. 03/18/18  Yes Rodriguez-Southworth, Sunday Spillers, PA-C  fluticasone (FLONASE) 50 MCG/ACT nasal spray Place 2 sprays into both nostrils daily. 03/18/18  Yes Rodriguez-Southworth, Sunday Spillers, PA-C  TURMERIC PO Take 1 tablet by mouth daily.   Yes [provider]  vitamin B-12 (CYANOCOBALAMIN) 1000 MCG tablet Take 500 mcg by mouth daily.   Yes [provider]  buPROPion (WELLBUTRIN) 100 MG tablet Take 1 tablet (100 mg total) by mouth 2 (two) times daily. Patient not taking: Reported on 04/08/2018 03/17/18   Rodriguez-Southworth, Sunday Spillers, PA-C  Fluticasone-Salmeterol (ADVAIR DISKUS) 100-50 MCG/DOSE AEPB Inhale 1 puff  into the lungs 2 (two) times daily. Patient not taking: Reported on 04/08/2018 03/18/18 03/18/19  Rodriguez-Southworth, Sunday Spillers, PA-C    Family History Family History  Problem Relation Age of Onset  . HIV Father   . Diabetes Maternal Grandmother   . Diabetes Paternal Grandmother   . Colon cancer Maternal Uncle   . Stomach cancer Neg Hx   . Rectal cancer Neg Hx   . Esophageal cancer Neg Hx   . Liver cancer Neg Hx     Social History Social History   Tobacco Use  . Smoking status: Former Smoker    Types: Cigars  . Smokeless tobacco: Never Used  . Tobacco comment: occ  Substance Use Topics  . Alcohol use: No    Alcohol/week: 0.0 standard drinks  . Drug  use: No     Allergies   Doxycycline; Hydrocodone; Morphine and related; Penicillins; and Percocet [oxycodone-acetaminophen]   Review of Systems Review of Systems  Neurological: Positive for headaches.  Ten systems reviewed and are negative for acute change, except as noted in the HPI.    Physical Exam Updated Vital Signs BP (!) 141/97   Pulse 73   Temp 98.5 F (36.9 C) (Oral)   Resp 16   Ht 5\' 3"  (1.6 m)   Wt 84.8 kg   LMP 03/12/2018   SpO2 99%   BMI 33.13 kg/m   Physical Exam Vitals signs and nursing note reviewed.  Constitutional:      General: She is not in acute distress.    Appearance: She is well-developed. She is not diaphoretic.     Comments: Nontoxic appearing and in NAD  HENT:     Head: Normocephalic and atraumatic.     Right Ear: Tympanic membrane, ear canal and external ear normal.     Left Ear: Tympanic membrane, ear canal and external ear normal.     Mouth/Throat:     Mouth: Mucous membranes are moist.     Comments: Symmetric rise of the uvula with phonation. Eyes:     General: No scleral icterus.    Extraocular Movements: Extraocular movements intact.     Conjunctiva/sclera: Conjunctivae normal.     Pupils: Pupils are equal, round, and reactive to light.     Comments: No nystagmus  Neck:     Musculoskeletal: Normal range of motion.     Comments: No meningismus Cardiovascular:     Rate and Rhythm: Normal rate and regular rhythm.     Pulses: Normal pulses.  Pulmonary:     Effort: Pulmonary effort is normal. No respiratory distress.     Breath sounds: No stridor.     Comments: Respirations even and unlabored Musculoskeletal: Normal range of motion.  Skin:    General: Skin is warm and dry.     Coloration: Skin is not pale.     Findings: No erythema or rash.  Neurological:     Mental Status: She is alert and oriented to person, place, and time.     Coordination: Coordination normal.     Comments: GCS 15. Speech is goal oriented. No cranial  nerve deficits appreciated; symmetric eyebrow raise, no facial drooping, tongue midline. Patient has equal grip strength bilaterally with 5/5 strength against resistance in all major muscle groups bilaterally. Sensation to light touch intact. Patient moves extremities without ataxia. Patient ambulatory with steady gait.   Psychiatric:        Behavior: Behavior normal.      ED Treatments / Results  Labs (all labs  ordered are listed, but only abnormal results are displayed) Labs Reviewed  CBC - Abnormal; Notable for the following components:      Result Value   WBC 11.5 (*)    Hemoglobin 11.8 (*)    HCT 35.8 (*)    MCV 72.6 (*)    MCH 23.9 (*)    All other components within normal limits  COMPREHENSIVE METABOLIC PANEL - Abnormal; Notable for the following components:   Calcium 8.5 (*)    All other components within normal limits  PROTIME-INR  APTT  DIFFERENTIAL  I-STAT BETA HCG BLOOD, ED ([facility], [facility], [facility] ONLY)    EKG None  Radiology Ct Head Wo Contrast  Result Date: 04/07/2018 CLINICAL DATA:  Headaches for several hours EXAM: CT HEAD WITHOUT CONTRAST TECHNIQUE: Contiguous axial images were obtained from the base of the skull through the vertex without intravenous contrast. COMPARISON:  None. FINDINGS: Brain: No evidence of acute infarction, hemorrhage, hydrocephalus, extra-axial collection or mass lesion/mass effect. Vascular: No hyperdense vessel or unexpected calcification. Skull: Normal. Negative for fracture or focal lesion. Sinuses/Orbits: No acute finding. Other: None. IMPRESSION: Normal head CT Electronically Signed   By: Inez Catalina M.D.   On: 04/07/2018 21:45    Procedures Procedures (including critical care time)  Medications Ordered in ED Medications  sodium chloride flush (NS) 0.9 % injection 3 mL (has no administration in time range)  metoCLOPramide (REGLAN) tablet 10 mg (10 mg Oral Given 04/08/18 0309)     Initial Impression / Assessment and Plan / ED Course  I  have reviewed the triage vital signs and the nursing notes.  Pertinent labs & imaging results that were available during my care of the patient were reviewed by me and considered in my medical decision making (see chart for details).     38 year old female presents to the emergency department for evaluation of right arm pain as well as a right periorbital pain consistent with ocular migraine.  Her arm pain has been ongoing for many months.  Notes improvement to her symptoms with massage of her right shoulder.  Feels as though her symptoms have been subjectively worse over the past 2 days.  Is neurovascularly intact on exam.  She denies sensation deficits during my assessment.  No weakness or associated neck pain.  With respect to the patient's headache, her neurologic exam is nonfocal.  Symptoms have improved following Aleve.  She underwent head CT which shows no acute or emergent intracranial process.  Laboratory evaluation reassuring and consistent with baseline.  Given chronicity of arm pain, will refer to neurology as patient may benefit from a nerve conduction study.  She notes history of similar paresthesias in her bilateral lower extremities, though these have been present since childhood.  Encouraged continued home use of NSAIDs PRN.  Return precautions discussed and provided. Patient discharged in stable condition with no unaddressed concerns.   Final Clinical Impressions(s) / ED Diagnoses   Final diagnoses:  Pain in right arm  Ocular migraine    ED Discharge Orders    None       Antonietta Breach, PA-C 04/08/18 0340    Ezequiel Essex, MD 04/08/18 [phone]

## 2018-10-26 ENCOUNTER — Emergency Department (HOSPITAL_COMMUNITY)
Admission: EM | Admit: 2018-10-26 | Discharge: 2018-10-27 | Disposition: A | Discharge status: Home / Self Care | Payer: Medicaid Other | Attending: Emergency Medicine | Admitting: Emergency Medicine

## 2018-10-26 ENCOUNTER — Encounter (HOSPITAL_COMMUNITY): Payer: Self-pay

## 2018-10-26 DIAGNOSIS — Y9301 Activity, walking, marching and hiking: Secondary | ICD-10-CM | POA: Diagnosis not present

## 2018-10-26 DIAGNOSIS — J45909 Unspecified asthma, uncomplicated: Secondary | ICD-10-CM | POA: Diagnosis not present

## 2018-10-26 DIAGNOSIS — Y929 Unspecified place or not applicable: Secondary | ICD-10-CM | POA: Insufficient documentation

## 2018-10-26 DIAGNOSIS — Y999 Unspecified external cause status: Secondary | ICD-10-CM | POA: Insufficient documentation

## 2018-10-26 DIAGNOSIS — S99921A Unspecified injury of right foot, initial encounter: Secondary | ICD-10-CM | POA: Diagnosis not present

## 2018-10-26 DIAGNOSIS — Z79899 Other long term (current) drug therapy: Secondary | ICD-10-CM | POA: Diagnosis not present

## 2018-10-26 DIAGNOSIS — X509XXA Other and unspecified overexertion or strenuous movements or postures, initial encounter: Secondary | ICD-10-CM | POA: Insufficient documentation

## 2018-10-26 DIAGNOSIS — M7989 Other specified soft tissue disorders: Secondary | ICD-10-CM | POA: Diagnosis not present

## 2018-10-26 DIAGNOSIS — M79671 Pain in right foot: Secondary | ICD-10-CM

## 2018-10-26 DIAGNOSIS — Z87891 Personal history of nicotine dependence: Secondary | ICD-10-CM | POA: Diagnosis not present

## 2018-10-26 NOTE — ED Triage Notes (Signed)
Pt states that she was walking and rolled her R ankle, painful to walk, no swelling or deformity noted.

## 2018-10-27 ENCOUNTER — Emergency Department (HOSPITAL_COMMUNITY): Payer: Medicaid Other

## 2018-10-27 DIAGNOSIS — M7989 Other specified soft tissue disorders: Secondary | ICD-10-CM | POA: Diagnosis not present

## 2018-10-27 MED ORDER — KETOROLAC TROMETHAMINE 30 MG/ML IJ SOLN
30.0000 mg | Freq: Once | INTRAMUSCULAR | Status: AC
  Administered 2018-10-27: 01:00:00 30 mg via INTRAMUSCULAR
  Filled 2018-10-27: qty 1

## 2018-10-27 NOTE — Discharge Instructions (Signed)
Thank you for allowing me to care for you today in the Emergency Department.  Use the crutches until you can easily put weight on your right foot without uncontrollable pain.  Elevate your right leg so that your toes are at or above the level of your nose to help with pain.  Apply an ice pack to areas that are sore for 15 to 20 minutes as frequently as needed.  Take 650 mg of Tylenol or 600 mg of ibuprofen with food every 6 hours for pain.  You can alternate between these 2 medications every 3 hours if your pain returns.  For instance, you can take Tylenol at noon, followed by a dose of ibuprofen at 3, followed by second dose of Tylenol and 6.  At night, you can try wrapping your foot in an Ace wrap as compression will start to help the pain.   Follow-up with primary care if your symptoms do not start to significantly improve in the next week.  Return to the emergency department if you develop high fevers, chills, if the foot gets red hot to the touch and you develop red streaking up the leg, if your toes turn blue, or if your lower leg and calf get very tight and swollen, or if you develop other new, concerning symptoms.

## 2018-10-27 NOTE — ED Provider Notes (Signed)
[facility] EMERGENCY DEPARTMENT Provider Note   CSN: [id number] Arrival date & time: 10/26/18  2342     History   Chief Complaint Chief Complaint  Patient presents with  . Ankle Pain    HPI Kristen [name] is a 38 y.o. female with a history of alpha thalassemia and asthma who presents to the emergency department with a chief complaint of right foot pain.  The patient reports that she was walking when she rolled her right ankle earlier tonight.  She reports severe pain with walking and attempting to weight-bear.  She reports the pain is diffuse over the right lateral aspect of her foot.  No numbness, weakness, right ankle pain.  No history of right ankle or foot pain or surgery.  She took Tylenol PM prior to arrival with minimal improvement in her symptoms.     The history is provided by the patient. No language interpreter was used.    Past Medical History:  Diagnosis Date  . Alpha thalassemia ([place])   . Asthma    rescue inhaler- last use May 2017  . Complication of anesthesia   . Endometriosis   . Gestational diabetes    metformin and insulin  . Preterm labor with preterm delivery   . Tubular adenoma of colon     Patient Active Problem List   Diagnosis Date Noted  . 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 Morbid obesity with BMI of 40.0-44.9, adult ([place]) 10/22/2016  . Gestational diabetes mellitus (GDM) affecting fifth pregnancy 10/22/2016  . Unwanted fertility 09/17/2016  . Bowel trouble 02/07/2016  . Slow transit constipation 02/07/2016    Past Surgical History:  Procedure Laterality Date  . CESAREAN SECTION    . CESAREAN SECTION N/A 10/29/2015   Procedure: CESAREAN SECTION;  Surgeon: Woodroe Mode, MD;  Location: [facility];  Service: Obstetrics;  Laterality: N/A;  . DILATION AND CURETTAGE OF UTERUS       OB History    Gravida  12   Para  5   Term  3   Preterm  2   AB  8   Living  5     SAB  0   TAB  0   Ectopic   0   Multiple  1   Live Births  5            Home Medications    Prior to Admission medications   Medication Sig Start Date End Date Taking? Authorizing Provider  albuterol (PROVENTIL HFA;VENTOLIN HFA) 108 (90 Base) MCG/ACT inhaler Inhale 2 puffs into the lungs every 4 (four) hours as needed for wheezing or shortness of breath. 03/18/18   Rodriguez-Southworth, Sunday Spillers, PA-C  buPROPion (WELLBUTRIN) 100 MG tablet Take 1 tablet (100 mg total) by mouth 2 (two) times daily. Patient not taking: Reported on 04/08/2018 03/17/18   Rodriguez-Southworth, Sunday Spillers, PA-C  fluticasone [facility]) 50 MCG/ACT nasal spray Place 2 sprays into both nostrils daily. 03/18/18   Rodriguez-Southworth, Sunday Spillers, PA-C  Fluticasone-Salmeterol (ADVAIR DISKUS) 100-50 MCG/DOSE AEPB Inhale 1 puff into the lungs 2 (two) times daily. Patient not taking: Reported on 04/08/2018 03/18/18 03/18/19  Rodriguez-Southworth, Sunday Spillers, PA-C  TURMERIC PO Take 1 tablet by mouth daily.    [provider]  vitamin B-12 (CYANOCOBALAMIN) 1000 MCG tablet Take 500 mcg by mouth daily.    [provider]    Family History Family History  Problem Relation Age of Onset  . HIV Father   . Diabetes Maternal Grandmother   .  Diabetes Paternal Grandmother   . Colon cancer Maternal Uncle   . Stomach cancer Neg Hx   . Rectal cancer Neg Hx   . Esophageal cancer Neg Hx   . Liver cancer Neg Hx     Social History Social History   Tobacco Use  . Smoking status: Former Smoker    Types: Cigars  . Smokeless tobacco: Never Used  . Tobacco comment: occ  Substance Use Topics  . Alcohol use: No    Alcohol/week: 0.0 standard drinks  . Drug use: No     Allergies   Doxycycline, Hydrocodone, Morphine and related, Penicillins, and Percocet [oxycodone-acetaminophen]   Review of Systems Review of Systems  Constitutional: Negative for activity change.  Respiratory: Negative for shortness of breath.   Cardiovascular: Negative for chest  pain.  Gastrointestinal: Negative for abdominal pain.  Musculoskeletal: Positive for arthralgias, gait problem and myalgias. Negative for back pain and joint swelling.  Skin: Negative for rash.  Neurological: Negative for weakness and numbness.     Physical Exam Updated Vital Signs BP (!) 143/92 (BP Location: Left Arm)   Pulse 62   Temp 98.7 F (37.1 C) (Oral)   Resp 20   SpO2 100%   Physical Exam Vitals signs and nursing note reviewed.  Constitutional:      General: She is not in acute distress. HENT:     Head: Normocephalic.  Eyes:     Conjunctiva/sclera: Conjunctivae normal.  Neck:     Musculoskeletal: Neck supple.  Cardiovascular:     Rate and Rhythm: Normal rate and regular rhythm.     Heart sounds: No murmur. No friction rub. No gallop.   Pulmonary:     Effort: Pulmonary effort is normal. No respiratory distress.  Abdominal:     General: There is no distension.     Palpations: Abdomen is soft.  Musculoskeletal:     Comments: Full active and passive range of motion of the right ankle and knee without tenderness to palpation, particularly over the lateral malleolus of the right ankle.  She has diffusely tender to palpation to the lateral aspect and sole of the right foot with no focal tenderness.  Minimal associated swelling.  She is able to bear weight on the bilateral lower extremities, but it is painful.  Good capillary refill of all digits of the right foot.  5-5 strength against resistance and sensation is intact and equal throughout.  Skin:    General: Skin is warm.     Findings: No rash.  Neurological:     Mental Status: She is alert.  Psychiatric:        Behavior: Behavior normal.      ED Treatments / Results  Labs (all labs ordered are listed, but only abnormal results are displayed) Labs Reviewed - No data to display  EKG None  Radiology Dg Ankle Complete Right  Result Date: 10/27/2018 CLINICAL DATA:  Rolled right ankle, no swelling or deformity  EXAM: RIGHT ANKLE - COMPLETE 3+ VIEW COMPARISON:  None. FINDINGS: Appearance of a tiny nondisplaced fracture lucency present through the tip of the lateral malleolus with minimal overlying swelling. No sizable ankle effusion. There is no evidence of arthropathy or other focal bone abnormality. Soft tissues are otherwise unremarkable. IMPRESSION: Suspect a tiny nondisplaced fracture through the tip of the lateral malleolus with minimal swelling and no effusion. Correlate for point tenderness. Electronically Signed   By: Lovena Le M.D.   On: 10/27/2018 00:44    Procedures Procedures (including  critical care time)  Medications Ordered in ED Medications  ketorolac (TORADOL) 30 MG/ML injection 30 mg (30 mg Intramuscular Given 10/27/18 0128)     Initial Impression / Assessment and Plan / ED Course  I have reviewed the triage vital signs and the nursing notes.  Pertinent labs & imaging results that were available during my care of the patient were reviewed by me and considered in my medical decision making (see chart for details).        38 year old female with a history of alpha thalassemia and asthma presenting with right foot pain after she rolled her ankle while walking earlier tonight.  X-ray of the right ankle was ordered by triage staff, which was concerning for an avulsion fracture to the lateral malleolus of the right ankle.  No corresponding tenderness on exam and patient reports a history of a right ankle fracture when she was 18.  On exam, she has mild diffuse tenderness throughout the right lateral foot into the sole of the foot.  No pinpoint tenderness.  There is minimal associated swelling.  Patient is also able to bear weight on the bilateral lower extremities and ambulate, but has antalgic gait.  Although right foot x-ray was not ordered, have a low suspicion for fracture given the mechanism of injury and physical exam.  Will order crutches and postop shoe.  The patient was encouraged  to use Ace wrap at home for compression to help with pain.  She can discontinue crutches when she cannot weight-bear without significant pain as well as RICE therapy discharge instructions.  She is hemodynamically stable and in no acute distress.  ER return precautions given.  Safe for discharge home with outpatient follow-up only as needed.  Final Clinical Impressions(s) / ED Diagnoses   Final diagnoses:  Injury of right foot, initial encounter  Right foot pain    ED Discharge Orders    None       ,  A, PA-C 10/27/18 0340    Ward, Delice Bison, DO 10/27/18 0405

## 2018-10-27 NOTE — ED Notes (Signed)
Patient verbalizes understanding of discharge instructions. Opportunity for questioning and answers were provided. Armband removed by staff, pt discharged from ED.  

## 2018-11-15 ENCOUNTER — Ambulatory Visit (HOSPITAL_COMMUNITY)
Admission: EM | Admit: 2018-11-15 | Discharge: 2018-11-15 | Disposition: A | Discharge status: Home / Self Care | Payer: Medicaid Other | Attending: Family Medicine | Admitting: Family Medicine

## 2018-11-15 ENCOUNTER — Other Ambulatory Visit: Payer: Self-pay

## 2018-11-15 ENCOUNTER — Encounter (HOSPITAL_COMMUNITY): Payer: Self-pay | Admitting: Emergency Medicine

## 2018-11-15 DIAGNOSIS — H11002 Unspecified pterygium of left eye: Secondary | ICD-10-CM | POA: Diagnosis not present

## 2018-11-15 NOTE — ED Provider Notes (Signed)
[place]    CSN: [id number] Arrival date & time: 11/15/18  [id number]      History   Chief Complaint Chief Complaint  Patient presents with  . Eye Pain    HPI Kristen [name] is a 38 y.o. female.   This is an initial visit for this 38 year old woman presenting to [facility] urgent care.  She is complaining about discoloration of her left eye for several weeks.  She does remember something flying in the eye when she was driving couple weeks ago.  Initially the area was red but then it became somewhat raised with a white center.  Patient helps woman following delivery.     Past Medical History:  Diagnosis Date  . Alpha thalassemia ([place])   . Asthma    rescue inhaler- last use May 2017  . Complication of anesthesia   . Endometriosis   . Gestational diabetes    metformin and insulin  . Preterm labor with preterm delivery   . Tubular adenoma of colon     Patient Active Problem List   Diagnosis Date Noted  . 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 Morbid obesity with BMI of 40.0-44.9, adult ([place]) 10/22/2016  . Gestational diabetes mellitus (GDM) affecting fifth pregnancy 10/22/2016  . Unwanted fertility 09/17/2016  . Bowel trouble 02/07/2016  . Slow transit constipation 02/07/2016    Past Surgical History:  Procedure Laterality Date  . CESAREAN SECTION    . CESAREAN SECTION N/A 10/29/2015   Procedure: CESAREAN SECTION;  Surgeon: Woodroe Mode, MD;  Location: [place];  Service: Obstetrics;  Laterality: N/A;  . DILATION AND CURETTAGE OF UTERUS      OB History    Gravida  12   Para  5   Term  3   Preterm  2   AB  8   Living  5     SAB  0   TAB  0   Ectopic  0   Multiple  1   Live Births  5            Home Medications    Prior to Admission medications   Medication Sig Start Date End Date Taking? Authorizing Provider  albuterol (PROVENTIL HFA;VENTOLIN HFA) 108 (90 Base) MCG/ACT inhaler Inhale 2 puffs into the  lungs every 4 (four) hours as needed for wheezing or shortness of breath. 03/18/18   Rodriguez-Southworth, Sunday Spillers, PA-C  fluticasone (FLONASE) 50 MCG/ACT nasal spray Place 2 sprays into both nostrils daily. 03/18/18   Rodriguez-Southworth, Sunday Spillers, PA-C  TURMERIC PO Take 1 tablet by mouth daily.    [provider]  vitamin B-12 (CYANOCOBALAMIN) 1000 MCG tablet Take 500 mcg by mouth daily.    [provider]  buPROPion (WELLBUTRIN) 100 MG tablet Take 1 tablet (100 mg total) by mouth 2 (two) times daily. Patient not taking: Reported on 04/08/2018 03/17/18 11/15/18  Rodriguez-Southworth, Sunday Spillers, PA-C  Fluticasone-Salmeterol (ADVAIR DISKUS) 100-50 MCG/DOSE AEPB Inhale 1 puff into the lungs 2 (two) times daily. Patient not taking: Reported on 04/08/2018 03/18/18 11/15/18  Rodriguez-Southworth, Sunday Spillers, PA-C    Family History Family History  Problem Relation Age of Onset  . HIV Father   . Diabetes Maternal Grandmother   . Diabetes Paternal Grandmother   . Colon cancer Maternal Uncle   . Stomach cancer Neg Hx   . Rectal cancer Neg Hx   . Esophageal cancer Neg Hx   . Liver cancer Neg Hx     Social History Social History  Tobacco Use  . Smoking status: Former Smoker    Types: Cigars  . Smokeless tobacco: Never Used  . Tobacco comment: occ  Substance Use Topics  . Alcohol use: No    Alcohol/week: 0.0 standard drinks  . Drug use: No     Allergies   Doxycycline, Hydrocodone, Morphine and related, Penicillins, and Percocet [oxycodone-acetaminophen]   Review of Systems Review of Systems   Physical Exam Triage Vital Signs ED Triage Vitals [11/15/18 1017]  Enc Vitals Group     BP 135/85     Pulse Rate 85     Resp 18     Temp 98.4 F (36.9 C)     Temp Source Oral     SpO2 100 %     Weight      Height      Head Circumference      Peak Flow      Pain Score 4     Pain Loc      Pain Edu?      Excl. in [place]?    No data found.  Updated Vital Signs BP 135/85 (BP  Location: Right Arm)   Pulse 85   Temp 98.4 F (36.9 C) (Oral)   Resp 18   SpO2 100%    Physical Exam Vitals signs and nursing note reviewed.  Constitutional:      Appearance: Normal appearance.  Eyes:     Comments: Injected medial left conjunctiva  Neck:     Musculoskeletal: Normal range of motion and neck supple.  Cardiovascular:     Rate and Rhythm: Normal rate.  Pulmonary:     Effort: Pulmonary effort is normal.  Musculoskeletal: Normal range of motion.  Skin:    General: Skin is warm and dry.  Neurological:     General: No focal deficit present.     Mental Status: She is alert.  Psychiatric:        Mood and Affect: Mood normal.        UC Treatments / Results  Labs (all labs ordered are listed, but only abnormal results are displayed) Labs Reviewed - No data to display  EKG   Radiology No results found.  Procedures Procedures (including critical care time)  Medications Ordered in UC Medications - No data to display  Initial Impression / Assessment and Plan / UC Course  I have reviewed the triage vital signs and the nursing notes.  Pertinent labs & imaging results that were available during my care of the patient were reviewed by me and considered in my medical decision making (see chart for details).    Final Clinical Impressions(s) / UC Diagnoses   Final diagnoses:  Pterygium eye, left     Discharge Instructions     A pterygium is usually benign and does not require drops or surgery.  I would like you to follow-up with your eye doctor to make sure that there is no foreign body causing this.    ED Prescriptions    None     I have reviewed the PDMP during this encounter.   Robyn Haber, MD 11/15/18 1044

## 2018-11-15 NOTE — ED Triage Notes (Signed)
Pt here with discoloration to left eye x weeks per pt

## 2018-11-15 NOTE — Discharge Instructions (Addendum)
A pterygium is usually benign and does not require drops or surgery.  I would like you to follow-up with your eye doctor to make sure that there is no foreign body causing this.

## 2019-06-08 DIAGNOSIS — F331 Major depressive disorder, recurrent, moderate: Secondary | ICD-10-CM | POA: Diagnosis not present

## 2019-09-28 ENCOUNTER — Emergency Department (HOSPITAL_COMMUNITY)
Admission: EM | Admit: 2019-09-28 | Discharge: 2019-09-29 | Disposition: A | Discharge status: Home / Self Care | Payer: Medicaid Other | Attending: Emergency Medicine | Admitting: Emergency Medicine

## 2019-09-28 ENCOUNTER — Encounter (HOSPITAL_COMMUNITY): Payer: Self-pay | Admitting: Emergency Medicine

## 2019-09-28 ENCOUNTER — Other Ambulatory Visit: Payer: Self-pay

## 2019-09-28 DIAGNOSIS — Z87891 Personal history of nicotine dependence: Secondary | ICD-10-CM | POA: Diagnosis not present

## 2019-09-28 DIAGNOSIS — E119 Type 2 diabetes mellitus without complications: Secondary | ICD-10-CM | POA: Insufficient documentation

## 2019-09-28 DIAGNOSIS — R109 Unspecified abdominal pain: Secondary | ICD-10-CM | POA: Diagnosis present

## 2019-09-28 DIAGNOSIS — R1030 Lower abdominal pain, unspecified: Secondary | ICD-10-CM | POA: Diagnosis not present

## 2019-09-28 DIAGNOSIS — R102 Pelvic and perineal pain unspecified side: Secondary | ICD-10-CM

## 2019-09-28 DIAGNOSIS — N739 Female pelvic inflammatory disease, unspecified: Secondary | ICD-10-CM | POA: Diagnosis not present

## 2019-09-28 DIAGNOSIS — N838 Other noninflammatory disorders of ovary, fallopian tube and broad ligament: Secondary | ICD-10-CM | POA: Diagnosis not present

## 2019-09-28 DIAGNOSIS — Z7951 Long term (current) use of inhaled steroids: Secondary | ICD-10-CM | POA: Insufficient documentation

## 2019-09-28 DIAGNOSIS — A599 Trichomoniasis, unspecified: Secondary | ICD-10-CM

## 2019-09-28 DIAGNOSIS — J45909 Unspecified asthma, uncomplicated: Secondary | ICD-10-CM | POA: Insufficient documentation

## 2019-09-28 LAB — I-STAT BETA HCG BLOOD, ED (MC, WL, AP ONLY): I-stat hCG, quantitative: <5 m[IU]/mL (ref <5)

## 2019-09-28 LAB — URINALYSIS, ROUTINE W REFLEX MICROSCOPIC
Bilirubin Urine: NEGATIVE
Glucose, UA: NEGATIVE mg/dL
Hgb urine dipstick: NEGATIVE
Ketones, ur: NEGATIVE mg/dL
Leukocytes,Ua: NEGATIVE
Nitrite: NEGATIVE
Protein, ur: NEGATIVE mg/dL
Specific Gravity, Urine: 1.014 (ref 1.005–1.030)
pH: 7 (ref 5.0–8.0)

## 2019-09-28 LAB — COMPREHENSIVE METABOLIC PANEL
ALT: 29 U/L (ref 0–44)
AST: 41 U/L (ref 15–41)
Albumin: 3.8 g/dL (ref 3.5–5.0)
Alkaline Phosphatase: 57 U/L (ref 38–126)
Anion gap: 8 (ref 5–15)
BUN: 9 mg/dL (ref 6–20)
CO2: 23 mmol/L (ref 22–32)
Calcium: 8.7 mg/dL — ABNORMAL LOW (ref 8.9–10.3)
Chloride: 101 mmol/L (ref 98–111)
Creatinine, Ser: 0.86 mg/dL (ref 0.44–1.00)
GFR calc Af Amer: >60 mL/min (ref >60)
GFR calc non Af Amer: >60 mL/min (ref >60)
Glucose, Bld: 88 mg/dL (ref 70–99)
Potassium: 4.2 mmol/L (ref 3.5–5.1)
Sodium: 132 mmol/L — ABNORMAL LOW (ref 135–145)
Total Bilirubin: 0.6 mg/dL (ref 0.3–1.2)
Total Protein: 6.9 g/dL (ref 6.5–8.1)

## 2019-09-28 LAB — CBC
HCT: 38 % (ref 36.0–46.0)
Hemoglobin: 12.2 g/dL (ref 12.0–15.0)
MCH: 23.9 pg — ABNORMAL LOW (ref 26.0–34.0)
MCHC: 32.1 g/dL (ref 30.0–36.0)
MCV: 74.4 fL — ABNORMAL LOW (ref 80.0–100.0)
Platelets: 172 10*3/uL (ref 150–400)
RBC: 5.11 MIL/uL (ref 3.87–5.11)
RDW: 15.8 % — ABNORMAL HIGH (ref 11.5–15.5)
WBC: 10.4 10*3/uL (ref 4.0–10.5)
nRBC: 0 % (ref 0.0–0.2)

## 2019-09-28 LAB — LIPASE, BLOOD: Lipase: 47 U/L (ref 11–51)

## 2019-09-28 MED ORDER — SODIUM CHLORIDE 0.9% FLUSH: 3.0000 mL | Freq: Once | INTRAVENOUS | Status: DC

## 2019-09-28 NOTE — ED Triage Notes (Addendum)
Pt presents to ED POV. Pt c/o RLQ&LLQ abd pain since thurs. Pt also c/o n/v since sat. Pt reports pain gets intermittently worse and radiates upward. Pt reports emesis x3 today. Denies bowel and bladder complaints

## 2019-09-29 ENCOUNTER — Encounter (HOSPITAL_COMMUNITY): Payer: Self-pay | Admitting: Student

## 2019-09-29 ENCOUNTER — Emergency Department (HOSPITAL_COMMUNITY): Payer: Medicaid Other

## 2019-09-29 DIAGNOSIS — N838 Other noninflammatory disorders of ovary, fallopian tube and broad ligament: Secondary | ICD-10-CM | POA: Diagnosis not present

## 2019-09-29 DIAGNOSIS — R102 Pelvic and perineal pain: Secondary | ICD-10-CM | POA: Diagnosis not present

## 2019-09-29 LAB — WET PREP, GENITAL
Sperm: NONE SEEN
Yeast Wet Prep HPF POC: NONE SEEN

## 2019-09-29 LAB — CBG MONITORING, ED: Glucose-Capillary: 90 mg/dL (ref 70–99)

## 2019-09-29 MED ORDER — CEFTRIAXONE SODIUM 500 MG IJ SOLR
500.0000 mg | Freq: Once | INTRAMUSCULAR | Status: AC
  Administered 2019-09-29: 500 mg via INTRAMUSCULAR
  Filled 2019-09-29: qty 500

## 2019-09-29 MED ORDER — ONDANSETRON HCL 4 MG/2ML IJ SOLN
4.0000 mg | Freq: Once | INTRAMUSCULAR | Status: AC
  Administered 2019-09-29: 4 mg via INTRAVENOUS
  Filled 2019-09-29: qty 2

## 2019-09-29 MED ORDER — DOXYCYCLINE HYCLATE 100 MG PO TABS
100.0000 mg | ORAL_TABLET | Freq: Once | ORAL | Status: AC
  Administered 2019-09-29: 100 mg via ORAL
  Filled 2019-09-29: qty 1

## 2019-09-29 MED ORDER — FENTANYL CITRATE (PF) 100 MCG/2ML IJ SOLN
50.0000 ug | Freq: Once | INTRAMUSCULAR | Status: AC
  Administered 2019-09-29: 50 ug via INTRAVENOUS
  Filled 2019-09-29: qty 2

## 2019-09-29 MED ORDER — KETOROLAC TROMETHAMINE 15 MG/ML IJ SOLN
15.0000 mg | Freq: Once | INTRAMUSCULAR | Status: AC
  Administered 2019-09-29: 15 mg via INTRAVENOUS
  Filled 2019-09-29: qty 1

## 2019-09-29 MED ORDER — METRONIDAZOLE 500 MG PO TABS
500.0000 mg | ORAL_TABLET | Freq: Two times a day (BID) | ORAL | 0 refills | Status: DC
Start: 2019-09-29 — End: 2020-01-23

## 2019-09-29 MED ORDER — DOXYCYCLINE HYCLATE 100 MG PO CAPS
100.0000 mg | ORAL_CAPSULE | Freq: Two times a day (BID) | ORAL | 0 refills | Status: DC
Start: 2019-09-29 — End: 2020-01-23

## 2019-09-29 MED ORDER — NAPROXEN 500 MG PO TABS
500.0000 mg | ORAL_TABLET | Freq: Two times a day (BID) | ORAL | 0 refills | Status: DC | PRN
Start: 2019-09-29 — End: 2020-01-23

## 2019-09-29 MED ORDER — ONDANSETRON 4 MG PO TBDP
4.0000 mg | ORAL_TABLET | Freq: Three times a day (TID) | ORAL | 0 refills | Status: DC | PRN
Start: 2019-09-29 — End: 2020-01-23

## 2019-09-29 MED ORDER — STERILE WATER FOR INJECTION IJ SOLN
INTRAMUSCULAR | Status: AC
  Filled 2019-09-29: qty 10

## 2019-09-29 NOTE — ED Notes (Signed)
Pt transported to US

## 2019-09-29 NOTE — ED Provider Notes (Signed)
Pt is a 39 year old female whose care was transferred to me at shift change from [facility], PA-C. Her HPI is below:  Kristen [name] is a 39 y.o. female with a history of alpha thalassemia, asthma, constipation, and prior abdominal surgeries including C-section and D&C who presents to the emergency department with complaints of abdominal pain for the past 1 week.  Patient states the pain is located to her lower abdomen, is a constant dull ache with intermittent significant increases in pain with sharp spasms that have been increasing in frequency since onset of pain, now occurring at least once per hour.  No alleviating or aggravating factors to her discomfort.  She has had some nausea with associated episodes of emesis but has been able to keep some things down. She denies fever, chills, hematemesis, melena, hematochezia, diarrhea, constipation, dysuria, or atypical vaginal bleeding/discharge LMP July 13th. Sexually active with 1 partner, monogamous, no concern for STI.   Physical Exam  BP 105/83 (BP Location: Left Arm)   Pulse 61   Temp 98.4 F (36.9 C) (Oral)   Resp 16   Ht 5\' 3"  (1.6 m)   Wt 74.8 kg   SpO2 100%   BMI 29.23 kg/m   Physical Exam Vitals and nursing note reviewed. Exam conducted with a chaperone present.  Constitutional:      General: She is not in acute distress.    Appearance: She is well-developed. She is not toxic-appearing.  HENT:     Head: Normocephalic and atraumatic.  Eyes:     General:        Right eye: No discharge.        Left eye: No discharge.     Conjunctiva/sclera: Conjunctivae normal.  Cardiovascular:     Rate and Rhythm: Normal rate and regular rhythm.  Pulmonary:     Effort: Pulmonary effort is normal. No respiratory distress.     Breath sounds: Normal breath sounds. No wheezing, rhonchi or rales.  Abdominal:     General: There is no distension.     Palpations: Abdomen is soft.     Tenderness: There is abdominal tenderness in the  suprapubic area. There is no guarding or rebound. Negative signs include Murphy's sign and McBurney's sign.  Genitourinary:    Comments: Patient has thin white discharge present.  She is tender to palpation throughout bimanual exam.  Palpable masses. Musculoskeletal:     Cervical back: Neck supple.  Skin:    General: Skin is warm and dry.     Findings: No rash.  Neurological:     Mental Status: She is alert.     Comments: Clear speech.   Psychiatric:        Behavior: Behavior normal.   ED Course/Procedures     Procedures  MDM  Patient is a 39 year old female is care was transferred to me at shift change from [facility].  Patient was diagnosed with PID.  There was concern that patient has an allergy to doxycycline.  This was discussed with pharmacy who recommended clarifying the allergy with the patient and giving her a dose in the ED and monitoring her.  Patient given a dose of Rocephin and doxycycline here in the emergency department and was closely monitored for the past hour. Patient seemed to tolerate this well and has had no obvious reaction.  Patient will be discharged at this time with 2 weeks of doxycycline as well as zofran, flagyl, and naproxen.  She understands to return to the ED  with new or worsening symptoms.  Recommended following up with her PCP regarding her symptoms.  Her diagnosis was discussed with her in detail and her questions were answered.  She was amicable at the time of discharge.  Her vital signs are stable.  Note: Portions of this report may have been transcribed using voice recognition software. Every effort was made to ensure accuracy; however, inadvertent computerized transcription errors may be present.       Rayna Sexton, PA-C 09/29/19 1729    Virgel Manifold, MD 09/29/19 [phone]

## 2019-09-29 NOTE — ED Provider Notes (Signed)
[facility] EMERGENCY DEPARTMENT Provider Note   CSN: [id number] Arrival date & time: 09/28/19  1949     History Chief Complaint  Patient presents with   Abdominal Pain    Kristen [name] is a 39 y.o. female with a history of alpha thalassemia, asthma, constipation, and prior abdominal surgeries including C-section and D&C who presents to the emergency department with complaints of abdominal pain for the past 1 week.  Patient states the pain is located to her lower abdomen, is a constant dull ache with intermittent significant increases in pain with sharp spasms that have been increasing in frequency since onset of pain, now occurring at least once per hour.  No alleviating or aggravating factors to her discomfort.  She has had some nausea with associated episodes of emesis but has been able to keep some things down. She denies fever, chills, hematemesis, melena, hematochezia, diarrhea, constipation, dysuria, or atypical vaginal bleeding/discharge LMP July 13th. Sexually active with 1 partner, monogamous, no concern for STI.   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   Morbid obesity with BMI of 40.0-44.9, adult ([place]) 10/22/2016   Gestational diabetes mellitus (GDM) affecting fifth pregnancy 10/22/2016   Unwanted fertility 09/17/2016   Bowel trouble 02/07/2016   Slow transit constipation 02/07/2016    Past Surgical History:  Procedure Laterality Date   CESAREAN SECTION     CESAREAN SECTION N/A 10/29/2015   Procedure: CESAREAN SECTION;  Surgeon: Woodroe Mode, MD;  Location: [place];  Service:  Obstetrics;  Laterality: N/A;   DILATION AND CURETTAGE OF UTERUS       OB History    Gravida  12   Para  5   Term  3   Preterm  2   AB  8   Living  5     SAB  0   TAB  0   Ectopic  0   Multiple  1   Live Births  5           Family History  Problem Relation Age of Onset   HIV Father    Diabetes Maternal Grandmother    Diabetes Paternal Grandmother    Colon cancer Maternal Uncle    Stomach cancer Neg Hx    Rectal cancer Neg Hx    Esophageal cancer Neg Hx    Liver cancer Neg Hx     Social History   Tobacco Use   Smoking status: Former Smoker    Types: Cigars   Smokeless tobacco: Never Used   Tobacco comment: occ  Vaping Use   Vaping Use: Never used  Substance Use Topics   Alcohol use: No    Alcohol/week: 0.0 standard drinks   Drug use: No    Home Medications Prior to Admission medications   Medication Sig Start Date End Date Taking? Authorizing Provider  albuterol (PROVENTIL HFA;VENTOLIN HFA) 108 (90 Base) MCG/ACT inhaler Inhale 2 puffs into the lungs every 4 (four) hours as needed for wheezing or shortness of breath. 03/18/18   Rodriguez-Southworth, Sunday Spillers, PA-C  fluticasone (  FLONASE) 50 MCG/ACT nasal spray Place 2 sprays into both nostrils daily. 03/18/18   Rodriguez-Southworth, Sunday Spillers, PA-C  TURMERIC PO Take 1 tablet by mouth daily.    [provider]  vitamin B-12 (CYANOCOBALAMIN) 1000 MCG tablet Take 500 mcg by mouth daily.    [provider]  buPROPion (WELLBUTRIN) 100 MG tablet Take 1 tablet (100 mg total) by mouth 2 (two) times daily. Patient not taking: Reported on 04/08/2018 03/17/18 11/15/18  Rodriguez-Southworth, Sunday Spillers, PA-C  Fluticasone-Salmeterol (ADVAIR DISKUS) 100-50 MCG/DOSE AEPB Inhale 1 puff into the lungs 2 (two) times daily. Patient not taking: Reported on 04/08/2018 03/18/18 11/15/18  Rodriguez-Southworth, Sunday Spillers, PA-C    Allergies    Doxycycline, Hydrocodone, Morphine and related, Penicillins, and  Percocet [oxycodone-acetaminophen]  Review of Systems   Review of Systems  Constitutional: Negative for chills and fever.  Respiratory: Negative for shortness of breath.   Cardiovascular: Negative for chest pain.  Gastrointestinal: Positive for abdominal pain, nausea and vomiting. Negative for anal bleeding, blood in stool, constipation and diarrhea.  Genitourinary: Negative for dysuria, vaginal bleeding and vaginal discharge.  All other systems reviewed and are negative.   Physical Exam Updated Vital Signs BP 104/66 (BP Location: Right Arm)    Pulse (!) 56    Temp 98.4 F (36.9 C) (Oral)    Resp 12    SpO2 99%   Physical Exam Vitals and nursing note reviewed. Exam conducted with a chaperone present.  Constitutional:      General: She is not in acute distress.    Appearance: She is well-developed. She is not toxic-appearing.  HENT:     Head: Normocephalic and atraumatic.  Eyes:     General:        Right eye: No discharge.        Left eye: No discharge.     Conjunctiva/sclera: Conjunctivae normal.  Cardiovascular:     Rate and Rhythm: Normal rate and regular rhythm.  Pulmonary:     Effort: Pulmonary effort is normal. No respiratory distress.     Breath sounds: Normal breath sounds. No wheezing, rhonchi or rales.  Abdominal:     General: There is no distension.     Palpations: Abdomen is soft.     Tenderness: There is abdominal tenderness in the suprapubic area. There is no guarding or rebound. Negative signs include Murphy's sign and McBurney's sign.  Genitourinary:    Comments: Patient has thin white discharge present.  She is tender to palpation throughout bimanual exam.  Palpable masses. Musculoskeletal:     Cervical back: Neck supple.  Skin:    General: Skin is warm and dry.     Findings: No rash.  Neurological:     Mental Status: She is alert.     Comments: Clear speech.   Psychiatric:        Behavior: Behavior normal.    ED Results / Procedures / Treatments     Labs (all labs ordered are listed, but only abnormal results are displayed) Labs Reviewed  WET PREP, GENITAL - Abnormal; Notable for the following components:      Result Value   Trich, Wet Prep PRESENT (*)    Clue Cells Wet Prep HPF POC PRESENT (*)    WBC, Wet Prep HPF POC MANY (*)    All other components within normal limits  COMPREHENSIVE METABOLIC PANEL - Abnormal; Notable for the following components:   Sodium 132 (*)    Calcium 8.7 (*)    All other components within  normal limits  CBC - Abnormal; Notable for the following components:   MCV 74.4 (*)    MCH 23.9 (*)    RDW 15.8 (*)    All other components within normal limits  LIPASE, BLOOD  URINALYSIS, ROUTINE W REFLEX MICROSCOPIC  I-STAT BETA HCG BLOOD, ED ([facility], [facility], [facility] ONLY)  CBG MONITORING, ED  GC/CHLAMYDIA PROBE AMP ([place]) NOT AT [facility]    EKG None  Radiology US PELVIC COMPLETE W TRANSVAGINAL AND TORSION R/O  Result Date: 09/29/2019 CLINICAL DATA:  Pelvic pain EXAM: TRANSABDOMINAL AND TRANSVAGINAL ULTRASOUND OF PELVIS DOPPLER ULTRASOUND OF OVARIES TECHNIQUE: Study was performed transabdominally to optimize pelvic field of view evaluation and transvaginally to optimize internal visceral architecture evaluation. Color and duplex Doppler ultrasound was utilized to evaluate blood flow to the ovaries. COMPARISON:  Jul 21, 2013 FINDINGS: Uterus Measurements: 10.5 x 5.4 x 5.8 cm = volume: 172.9 mL. Subtle calcification in the anterior uterus is less apparent currently than on previous study. A well-defined mass within the uterus is not seen. The uterine echotexture is mildly inhomogeneous which may indicate a degree of underlying leiomyomatous change. Note that there are subcentimeter cervical nabothian cysts. Endometrium Thickness: 7 mm.  No focal abnormality identified. Right ovary Measurements: 3.7 x 1.9 x 2.6 cm = volume: 9.5 mL. There is a somewhat complex mass arising in the right ovary measuring 1.5 x 1.8 x 1.8 cm, a  probable hemorrhagic cyst/follicle. No other extrauterine pelvic mass on the right. Left ovary Measurements: 3.0 x 2.1 x 1.9 cm = volume: 6.2 mL. Normal appearance/no adnexal mass. Pulsed Doppler evaluation of both ovaries demonstrates normal low-resistance arterial and venous waveforms. Other findings No abnormal free fluid. IMPRESSION: 1. The uterus has a somewhat inhomogeneous echotexture pattern with ill-defined calcification anteriorly, less apparent than on the 2015 study. Question underlying degree of leiomyomatous change within the uterus, although a well-defined leiomyoma is not appreciable on this study within the uterus. 2. Complex mass in the right ovary measuring 1.5 x 1.8 x 1.8 cm, a probable hemorrhagic follicle or cyst. No other extrauterine pelvic mass. No free pelvic fluid. Short-interval follow up ultrasound in 6-12 weeks is recommended, preferably during the week following the patient's normal menses. 3.  Subcentimeter cervical nabothian cysts noted. 4. No findings indicative of ovarian torsion. No enlargement of ovaries. Low resistance Doppler waveforms noted in each ovary. 5.  Normal appearing endometrium. Electronically Signed   By: Lowella Grip III M.D.   On: 09/29/2019 14:10    Procedures Procedures (including critical care time)  Medications Ordered in ED Medications  sodium chloride flush (NS) 0.9 % injection 3 mL (has no administration in time range)    ED Course  I have reviewed the triage vital signs and the nursing notes.  Pertinent labs & imaging results that were available during my care of the patient were reviewed by me and considered in my medical decision making (see chart for details).    MDM Rules/Calculators/A&P                         Patient presents to the ED with complaints of abdominal pain. Patient is nontoxic, resting comfortably, vitals without significant abnormality.  On exam patient has suprapubic abdominal tenderness without peritoneal  signs, she also has diffuse tenderness with bimanual exam.  Differential diagnosis includes: Pelvic inflammatory disease, ovarian cyst, ovarian torsion, UTI, appendicitis, diverticulitis, perforation, obstruction, ectopic pregnancy, IUP.  Additional history obtained:  Additional history obtained  from nursing note and chart review..  Lab Tests:  I reviewed & interpreted labs per triage protocol, which included:  CBC, CMP, lipase, pregnancy test, and urinalysis-mostly unremarkable.  No leukocytosis, UTI, or significant electrolyte derangement.  Liver function test and lipase are within normal limits.  Subsequently performed a pelvic exam and obtain GC/chlamydia swab as well as a wet prep, her wet prep shows findings of bacterial vaginosis as well as Trichomonas.  With a positive trichomonas test feel that PID is more likely at this time, will obtain ultrasound to further evaluate for possible tubo-ovarian abscess.  I have ordered fentanyl for pain and Zofran for nausea.  I have ordered, personally viewed, and interpreted pelvic ultrasound with Doppler, area the radiologist interpretation:1. The uterus has a somewhat inhomogeneous echotexture pattern with ill-defined calcification anteriorly, less apparent than on the 2015 study. Question underlying degree of leiomyomatous change within the uterus, although a well-defined leiomyoma is not appreciable on this study within the uterus. 2. Complex mass in the right ovary measuring 1.5 x 1.8 x 1.8 cm, a probable hemorrhagic follicle or cyst. No other extrauterine pelvic mass. No free pelvic fluid. Short-interval follow up ultrasound in 6-12 weeks is recommended, preferably during the week following the patient's normal menses. 3.  Subcentimeter cervical nabothian cysts noted. 4. No findings indicative of ovarian torsion. No enlargement of ovaries. Low resistance Doppler waveforms noted in each ovary. 5.  Normal appearing endometrium.  I called and spoke with  radiologist Dr. Jasmine December regarding patient's complex mass in the right ovary given our concern for PID, he states that he cannot definitively state that this is not a tubo-ovarian abscess but feels it is very unlikely and appears much more consistent with a hemorrhagic cyst.  I also called and spoke with OB/GYN Dr. Kennon Rounds who also felt this was very unlikely to be a tubo-ovarian abscess, recommended outpatient antibiotics and clinic follow-up, they will be sure to facilitate her repeat ultrasound.  Appreciate consultations.  Information relayed to the patient including need to inform all sexual partners regarding trichomonas diagnosis.  Will give IM Rocephin in the ED, she will be discharged with Flagyl, however given she is allergic to doxycycline I called and spoke with pharmacy team who recommended clarifying allergy with the patient and giving a dose in the emergency department with observation as there is not a particularly recommended outpatient tx for PID other than with doxycycline.  When I discussed with the patient she states that she did not know over the allergy to doxycycline, she does not specifically remember having a reaction to this, we will trial this as well as Rocephin in the ER and observed for 1 hour per discussion with supervising physician Dr. Johnney Killian who is in agreement.   15:30: Patient care signed out to [facility] PA-C at change of shift pending re-assessment S/p medications, if no signs of allergic reaction and patient feeling well would discharge home with strict return precautions, if she has reaction will need tx and possible discussion with OBGYN regarding tx regimen.   Findings and plan of care discussed with supervising physician Dr. Johnney Killian who is in agreement.   Portions of this note were generated with [employer]. Dictation errors may occur despite best attempts at proofreading.  Final Clinical Impression(s) / ED Diagnoses Final diagnoses:  Pelvic  inflammatory disease  Trichomonas infection    Rx / DC Orders ED Discharge Orders    None       Arliss Frisina R, PA-C  09/29/19 [place], MD 09/30/19 1515

## 2019-09-29 NOTE — Discharge Instructions (Addendum)
You were seen in the emergency department today for pelvic/abdominal pain.  Your labs are overall reassuring, however one of your pelvic samplings did show that you have trichomonas which is a sexually transmitted infection.  With your pain and this finding we are concerned that you have what is called pelvic inflammatory disease.  We are starting you on antibiotics including doxycycline and Flagyl, please take these as prescribed.  Do not drink alcohol when taking Flagyl as it can be should be dangerous.  We also send you home with naproxen to help with pain and Zofran to help with nausea and vomiting.   - Naproxen is a nonsteroidal anti-inflammatory medication that will help with pain and swelling. Be sure to take this medication as prescribed with food, 1 pill every 12 hours,  It should be taken with food, as it can cause stomach upset, and more seriously, stomach bleeding. Do not take other nonsteroidal anti-inflammatory medications with this such as Advil, Motrin, Aleve, Mobic, Goodie Powder, or Motrin.    - zofran-this is the antinausea medication, you may take this every 8 hours as needed for nausea and vomiting. You make take Tylenol per over the counter dosing with these medications.   We have prescribed you new medication(s) today. Discuss the medications prescribed today with your pharmacist as they can have adverse effects and interactions with your other medicines including over the counter and prescribed medications. Seek medical evaluation if you start to experience new or abnormal symptoms after taking one of these medicines, seek care immediately if you start to experience difficulty breathing, feeling of your throat closing, facial swelling, or rash as these could be indications of a more serious allergic reaction   With your trichomonas test being positive and this being a sexually transmitted infection is important that you inform all sexual partners.  Do not have intercourse again  until at least 1 week following last dose of antibiotics.  We would like you to follow-up with OB/GYN within 3 days for reevaluation.  Return to the ER for new or worsening symptoms including but not limited to increased pain, fever, inability to keep fluids down, or any other concerns.

## 2019-09-30 ENCOUNTER — Telehealth: Payer: Self-pay | Admitting: *Deleted

## 2019-09-30 LAB — GC/CHLAMYDIA PROBE AMP (~~LOC~~) NOT AT ARMC
Chlamydia: NEGATIVE
Comment: NEGATIVE
Comment: NORMAL
Neisseria Gonorrhea: NEGATIVE

## 2019-09-30 NOTE — Telephone Encounter (Signed)
Attempted to contact pt to complete transition of care assessment:  Transition Care Management Follow-up Telephone Call  . Medicaid Managed Care Transition Call Status:MM [facility] Call Made  . Date of discharge and from where: [facility], 09/29/19  . How have you been since you were released from the hospital? "ok"  . Any questions or concerns? No  Items Reviewed: Marland Kitchen Did the pt receive and understand the discharge instructions provided? Yes  . Medications obtained and verified? Yes  . Any new allergies since your discharge? No  . Dietary orders reviewed? Yes . Do you have support at home? Yes, family  Functional Questionnaire: (I = Independent and D = Dependent)  ADLs: Independent Bathing/Dressing:Independent Meal Prep: Independent Eating: Independent Maintaining continence: Independent Transferring/Ambulation: Independent Managing Meds: Independent   Follow up appointments reviewed:  PCP Hospital f/u appt confirmed? Yes  Scheduled to see  Minette Brine on 10/18/19 @ [facility] f/u appt confirmed? Pt to contact [facility] on 09/30/19 for follow up appt  Are transportation arrangements needed? No   If their condition worsens, is the pt aware to call PCP or go to the EmergencyDept.? Yes  Was the patient provided with contact information for the PCP's office or ED? yes  Was to pt encouraged to call back with questions or concerns? yes  Lenor Coffin, RN, BSN, [facility] [place] [phone]

## 2019-10-01 ENCOUNTER — Ambulatory Visit (HOSPITAL_COMMUNITY)
Admission: EM | Admit: 2019-10-01 | Discharge: 2019-10-01 | Disposition: A | Discharge status: Home / Self Care | Payer: Medicaid Other

## 2019-10-01 ENCOUNTER — Other Ambulatory Visit: Payer: Self-pay

## 2019-10-01 ENCOUNTER — Encounter (HOSPITAL_COMMUNITY): Payer: Self-pay | Admitting: Emergency Medicine

## 2019-10-01 DIAGNOSIS — T82898A Other specified complication of vascular prosthetic devices, implants and grafts, initial encounter: Secondary | ICD-10-CM

## 2019-10-01 NOTE — ED Triage Notes (Signed)
Patient reports having an iv on 09/28/2019.  Site was right ac.  Patient reports bruising and arm soreness.  Radial pulse 2 + (right).  Patient moves fingers without complaint

## 2019-10-01 NOTE — Discharge Instructions (Signed)
Ice Continue naprosyn Monitor for gradual improvement

## 2019-10-03 NOTE — ED Provider Notes (Signed)
[facility]    CSN: [id number] Arrival date & time: 10/01/19  1621      History   Chief Complaint Chief Complaint  Patient presents with  . Arm Problem    HPI Kristen [name] is a 39 y.o. female presenting today for evaluation of bruising and swelling around IV site.  Patient was in the emergency room on 8/4 and received a few medicines through IV.  Reports Toradol and fentanyl.  IV was in place for approximately 6 hours.  Since she has developed some bruising as well as soreness.  She denies any difficulty bending at right elbow.  Denies any paresthesias or numbness or tingling.  HPI  Past Medical History:  Diagnosis Date  . Alpha thalassemia ([place])   . Asthma    rescue inhaler- last use May 2017  . Complication of anesthesia   . Endometriosis   . Gestational diabetes    metformin and insulin  . Preterm labor with preterm delivery   . Tubular adenoma of colon     Patient Active Problem List   Diagnosis Date Noted  . 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 Morbid obesity with BMI of 40.0-44.9, adult ([place]) 10/22/2016  . Gestational diabetes mellitus (GDM) affecting fifth pregnancy 10/22/2016  . Unwanted fertility 09/17/2016  . Bowel trouble 02/07/2016  . Slow transit constipation 02/07/2016    Past Surgical History:  Procedure Laterality Date  . CESAREAN SECTION    . CESAREAN SECTION N/A 10/29/2015   Procedure: CESAREAN SECTION;  Surgeon: Woodroe Mode, MD;  Location: [place];  Service: Obstetrics;  Laterality: N/A;  . DILATION AND CURETTAGE OF UTERUS      OB History    Gravida  12   Para  5   Term  3   Preterm  2   AB  8   Living  5     SAB  0   TAB  0   Ectopic  0   Multiple  1   Live Births  5            Home Medications    Prior to Admission medications   Medication Sig Start Date End Date Taking? Authorizing Provider  doxycycline (VIBRAMYCIN) 100 MG capsule Take 1 capsule (100 mg total) by mouth 2 (two)  times daily. 09/29/19  Yes Petrucelli, Samantha R, PA-C  metroNIDAZOLE (FLAGYL) 500 MG tablet Take 1 tablet (500 mg total) by mouth 2 (two) times daily. 09/29/19  Yes Petrucelli, Samantha R, PA-C  naproxen (NAPROSYN) 500 MG tablet Take 1 tablet (500 mg total) by mouth 2 (two) times daily as needed for moderate pain. 09/29/19  Yes Petrucelli, Samantha R, PA-C  ondansetron (ZOFRAN ODT) 4 MG disintegrating tablet Take 1 tablet (4 mg total) by mouth every 8 (eight) hours as needed for nausea or vomiting. 09/29/19  Yes Petrucelli, Samantha R, PA-C  albuterol (PROVENTIL HFA;VENTOLIN HFA) 108 (90 Base) MCG/ACT inhaler Inhale 2 puffs into the lungs every 4 (four) hours as needed for wheezing or shortness of breath. 03/18/18   Rodriguez-Southworth, Sunday Spillers, PA-C  fluticasone (FLONASE) 50 MCG/ACT nasal spray Place 2 sprays into both nostrils daily. Patient not taking: Reported on 09/29/2019 03/18/18   Rodriguez-Southworth, Sunday Spillers, PA-C  buPROPion [facility]) 100 MG tablet Take 1 tablet (100 mg total) by mouth 2 (two) times daily. Patient not taking: Reported on 04/08/2018 03/17/18 11/15/18  Rodriguez-Southworth, Sunday Spillers, PA-C  Fluticasone-Salmeterol (ADVAIR DISKUS) 100-50 MCG/DOSE AEPB Inhale 1 puff into the lungs 2 (two) times daily.  Patient not taking: Reported on 04/08/2018 03/18/18 11/15/18  Rodriguez-Southworth, Sunday Spillers, PA-C    Family History Family History  Problem Relation Age of Onset  . HIV Father   . Diabetes Maternal Grandmother   . Diabetes Paternal Grandmother   . Colon cancer Maternal Uncle   . Stomach cancer Neg Hx   . Rectal cancer Neg Hx   . Esophageal cancer Neg Hx   . Liver cancer Neg Hx     Social History Social History   Tobacco Use  . Smoking status: Former Smoker    Types: Cigars  . Smokeless tobacco: Never Used  . Tobacco comment: occ  Vaping Use  . Vaping Use: Never used  Substance Use Topics  . Alcohol use: No    Alcohol/week: 0.0 standard drinks  . Drug use: No     Allergies    Hydrocodone, Morphine and related, Penicillins, and Percocet [oxycodone-acetaminophen]   Review of Systems Review of Systems  Constitutional: Negative for fatigue and fever.  Eyes: Negative for visual disturbance.  Respiratory: Negative for shortness of breath.   Cardiovascular: Negative for chest pain.  Gastrointestinal: Negative for abdominal pain, nausea and vomiting.  Musculoskeletal: Negative for arthralgias and joint swelling.  Skin: Positive for color change. Negative for rash and wound.  Neurological: Negative for dizziness, weakness, light-headedness and headaches.     Physical Exam Triage Vital Signs ED Triage Vitals  Enc Vitals Group     BP 10/01/19 1720 (!) 91/58     Pulse Rate 10/01/19 1720 64     Resp 10/01/19 1720 18     Temp 10/01/19 1720 98.3 F (36.8 C)     Temp Source 10/01/19 1720 Oral     SpO2 10/01/19 1720 99 %     Weight --      Height --      Head Circumference --      Peak Flow --      Pain Score 10/01/19 1716 4     Pain Loc --      Pain Edu? --      Excl. in [place]? --    No data found.  Updated Vital Signs BP (!) 91/58 (BP Location: Right Arm)   Pulse 64   Temp 98.3 F (36.8 C) (Oral)   Resp 18   SpO2 99%   Visual Acuity Right Eye Distance:   Left Eye Distance:   Bilateral Distance:    Right Eye Near:   Left Eye Near:    Bilateral Near:     Physical Exam Vitals and nursing note reviewed.  Constitutional:      Appearance: She is well-developed.     Comments: No acute distress  HENT:     Head: Normocephalic and atraumatic.     Nose: Nose normal.  Eyes:     Conjunctiva/sclera: Conjunctivae normal.  Cardiovascular:     Rate and Rhythm: Normal rate.  Pulmonary:     Effort: Pulmonary effort is normal. No respiratory distress.  Abdominal:     General: There is no distension.  Musculoskeletal:        General: Normal range of motion.     Cervical back: Neck supple.     Comments: Right elbow full active range of motion,  nontender to bony prominences Radial pulse 2+ Full active range of motion of wrist and fingers distally  Skin:    General: Skin is warm and dry.     Comments: Right AC with mild ecchymosis present, mild swelling, no  significant erythema or warmth  Neurological:     Mental Status: She is alert and oriented to person, place, and time.      UC Treatments / Results  Labs (all labs ordered are listed, but only abnormal results are displayed) Labs Reviewed - No data to display  EKG   Radiology No results found.  Procedures Procedures (including critical care time)  Medications Ordered in UC Medications - No data to display  Initial Impression / Assessment and Plan / UC Course  I have reviewed the triage vital signs and the nursing notes.  Pertinent labs & imaging results that were available during my care of the patient were reviewed by me and considered in my medical decision making (see chart for details).     Appears to have localized bruising around area of IV site, no signs of infection at this time, suggestive of likely soft tissue trauma causing bruising.  Recommended ice and anti-inflammatories and monitor for gradual improvement.  Discussed strict return precautions. Patient verbalized understanding and is agreeable with plan.  Final Clinical Impressions(s) / UC Diagnoses   Final diagnoses:  Hematoma of IV site, initial encounter [facility])     Discharge Instructions     Ice Continue naprosyn Monitor for gradual improvement   ED Prescriptions    None     PDMP not reviewed this encounter.   Janith Lima, PA-C 10/03/19 1719

## 2019-10-04 ENCOUNTER — Encounter: Payer: Self-pay | Admitting: Nurse Practitioner

## 2019-10-04 ENCOUNTER — Other Ambulatory Visit: Payer: Self-pay

## 2019-10-04 ENCOUNTER — Ambulatory Visit (INDEPENDENT_AMBULATORY_CARE_PROVIDER_SITE_OTHER): Payer: Medicaid Other | Admitting: Nurse Practitioner

## 2019-10-04 VITALS — BP 118/76 | HR 78 | Temp 97.9°F | Ht 63.6 in | Wt 165.4 lb

## 2019-10-04 DIAGNOSIS — Z113 Encounter for screening for infections with a predominantly sexual mode of transmission: Secondary | ICD-10-CM | POA: Diagnosis not present

## 2019-10-04 DIAGNOSIS — R1030 Lower abdominal pain, unspecified: Secondary | ICD-10-CM | POA: Diagnosis not present

## 2019-10-04 DIAGNOSIS — Z8639 Personal history of other endocrine, nutritional and metabolic disease: Secondary | ICD-10-CM | POA: Diagnosis not present

## 2019-10-04 DIAGNOSIS — R490 Dysphonia: Secondary | ICD-10-CM

## 2019-10-04 DIAGNOSIS — R935 Abnormal findings on diagnostic imaging of other abdominal regions, including retroperitoneum: Secondary | ICD-10-CM

## 2019-10-04 DIAGNOSIS — Z8709 Personal history of other diseases of the respiratory system: Secondary | ICD-10-CM | POA: Diagnosis not present

## 2019-10-04 DIAGNOSIS — Z1159 Encounter for screening for other viral diseases: Secondary | ICD-10-CM

## 2019-10-04 MED ORDER — OMEPRAZOLE 20 MG PO CPDR: 20.0000 mg | DELAYED_RELEASE_CAPSULE | Freq: Every day | ORAL | 1 refills | Status: DC

## 2019-10-04 MED ORDER — ALBUTEROL SULFATE HFA 108 (90 BASE) MCG/ACT IN AERS: 2.0000 | INHALATION_SPRAY | RESPIRATORY_TRACT | 1 refills | Status: DC | PRN

## 2019-10-04 NOTE — Progress Notes (Signed)
I,Yamilka Roman [employer] as a Education administrator for [employer], FNP.,have documented all relevant documentation on the behalf of Minette Brine, FNP,as directed by  Minette Brine, FNP while in the presence of Minette Brine, [place].  This visit occurred during the SARS-CoV-2 public health emergency.  Safety protocols were in place, including screening questions prior to the visit, additional usage of staff PPE, and extensive cleaning of exam room while observing appropriate contact time as indicated for disinfecting solutions.  Subjective:     Patient ID: Kristen [name] , female    DOB: Jan 15, 1981 , 39 y.o.   MRN: [id number]   Chief Complaint  Patient presents with  . ER F/U    patient stated she went to the ER for abdominal pain she stated her stomach hurts sometimes    HPI  Here for ER follow up for abdominal pain. She was going to see Dr. Jodi Mourning who has since retired.  Continues to have lower abdomen pain but is better. She continues to have upper abdomen pain. She had an ultrasound that reported she had a possible cyst.  She has had a colonoscopy in 2018 and to return in 2022.  She reports she is drinking well.  She also gets overheated as well.    Wt Readings from Last 3 Encounters: 10/04/19 : 165 lb 6.4 oz (75 kg) 09/29/19 : 165 lb (74.8 kg) 04/08/18 : 187 lb (84.8 kg)  She reports she was up to 251 lbs and began decreasing her intake of carbs and exercising more.      Past Medical History:  Diagnosis Date  . Alpha thalassemia ([place])   . Asthma    rescue inhaler- last use May 2017  . Complication of anesthesia   . Endometriosis   . Gestational diabetes    metformin and insulin  . Preterm labor with preterm delivery   . Tubular adenoma of colon      Family History  Problem Relation Age of Onset  . HIV Father   . Diabetes Maternal Grandmother   . Diabetes Paternal Grandmother   . Colon cancer Maternal Uncle   . Stomach cancer Neg Hx   . Rectal cancer Neg Hx   .  Esophageal cancer Neg Hx   . Liver cancer Neg Hx      Current Outpatient Medications:  .  albuterol (VENTOLIN HFA) 108 (90 Base) MCG/ACT inhaler, Inhale 2 puffs into the lungs every 4 (four) hours as needed for wheezing or shortness of breath., Disp: 18 g, Rfl: 1 .  doxycycline (VIBRAMYCIN) 100 MG capsule, Take 1 capsule (100 mg total) by mouth 2 (two) times daily., Disp: 28 capsule, Rfl: 0 .  metroNIDAZOLE (FLAGYL) 500 MG tablet, Take 1 tablet (500 mg total) by mouth 2 (two) times daily., Disp: 28 tablet, Rfl: 0 .  naproxen (NAPROSYN) 500 MG tablet, Take 1 tablet (500 mg total) by mouth 2 (two) times daily as needed for moderate pain., Disp: 15 tablet, Rfl: 0 .  fluticasone (FLONASE) 50 MCG/ACT nasal spray, Place 2 sprays into both nostrils daily. (Patient not taking: Reported on 09/29/2019), Disp: 9.9 g, Rfl: 2 .  omeprazole (PRILOSEC) 20 MG capsule, Take 1 capsule (20 mg total) by mouth daily., Disp: 30 capsule, Rfl: 1 .  ondansetron (ZOFRAN ODT) 4 MG disintegrating tablet, Take 1 tablet (4 mg total) by mouth every 8 (eight) hours as needed for nausea or vomiting. (Patient not taking: Reported on 10/04/2019), Disp: 5 tablet, Rfl: 0   Allergies  Allergen Reactions  .  Hydrocodone Shortness Of Breath  . Morphine And Related Shortness Of Breath and Itching    Burning  . Penicillins Hives    Has patient had a PCN reaction causing immediate rash, facial/tongue/throat swelling, SOB or lightheadedness with hypotension: Yes Has patient had a PCN reaction causing severe rash involving mucus membranes or skin necrosis: Yes Has patient had a PCN reaction that required hospitalization: No Has patient had a PCN reaction occurring within the last 10 years: Yes If all of the above answers are "NO", then may proceed with Cephalosporin use.   Marland Kitchen Percocet [Oxycodone-Acetaminophen] Anaphylaxis    Pt has taken hydromorphone without complications in the past     Review of Systems  Constitutional: Negative.    Respiratory: Negative.  Negative for cough.   Cardiovascular: Negative.  Negative for chest pain, palpitations and leg swelling.  Gastrointestinal: Negative.   Neurological: Negative for dizziness and headaches.  Psychiatric/Behavioral: Negative.      Today's Vitals   10/04/19 1009  BP: 118/76  Pulse: 78  Temp: 97.9 F (36.6 C)  TempSrc: Oral  Weight: 165 lb 6.4 oz (75 kg)  Height: 5' 3.6" (1.615 m)   Body mass index is 28.75 kg/m.   Objective:  Physical Exam Constitutional:      General: She is not in acute distress.    Appearance: Normal appearance.  Cardiovascular:     Rate and Rhythm: Normal rate and regular rhythm.     Pulses: Normal pulses.     Heart sounds: Normal heart sounds. No murmur heard.   Pulmonary:     Effort: Pulmonary effort is normal. No respiratory distress.  Skin:    Capillary Refill: Capillary refill takes less than 2 seconds.  Neurological:     General: No focal deficit present.     Mental Status: She is alert and oriented to person, place, and time.  Psychiatric:        Mood and Affect: Mood normal.        Behavior: Behavior normal.        Thought Content: Thought content normal.        Judgment: Judgment normal.         Assessment And Plan:     1. Lower abdominal pain  Seen in ER and her ultrasound showed a possible cyst will refer to GYN for further evaluation  - Ambulatory referral to Obstetrics / Gynecology  2. Abnormal abdominal ultrasound - Ambulatory referral to Obstetrics / Gynecology  3. History of diabetes mellitus, type II  Will check HgbA1c not seen in over 1 year - Hemoglobin A1c  4. Encounter for hepatitis C screening test for low risk patient  Will check Hepatitis C screening due to recent recommendations to screen all adults 18 years and older - Hepatitis C antibody  5. Hoarseness  She does describe chest burning and recent hoarseness  Will treat with omeprazole as this can be indicative of reflux. -  omeprazole (PRILOSEC) 20 MG capsule; Take 1 capsule (20 mg total) by mouth daily.  Dispense: 30 capsule; Refill: 1  6. History of asthma - albuterol (VENTOLIN HFA) 108 (90 Base) MCG/ACT inhaler; Inhale 2 puffs into the lungs every 4 (four) hours as needed for wheezing or shortness of breath.  Dispense: 18 g; Refill: 1  7. Screening for STD (sexually transmitted disease) - HIV antibody (with reflex)     Patient was given opportunity to ask questions. Patient verbalized understanding of the plan and was able to repeat key elements  of the plan. All questions were answered to their satisfaction.  Minette Brine, FNP   I, Minette Brine, FNP, have reviewed all documentation for this visit. The documentation on 10/05/19 for the exam, diagnosis, procedures, and orders are all accurate and complete.  THE PATIENT IS ENCOURAGED TO PRACTICE SOCIAL DISTANCING DUE TO THE COVID-19 PANDEMIC.

## 2019-10-04 NOTE — Patient Instructions (Addendum)
COVID-19 Vaccine Information can be found at: [email or web address].uk For questions related to vaccine distribution or appointments, please email [email or web address] or call [phone].   Discussed risk of covid 91 and if she changes her mind about the vaccine to call the office.  Encouraged to take multivitamin, vitamin d, vitamin c and zinc to increase immune system. Aware can call office if would like to have vaccine here at office.

## 2019-10-05 LAB — HEMOGLOBIN A1C
Est. average glucose Bld gHb Est-mCnc: 105 mg/dL
Hgb A1c MFr Bld: 5.3 % (ref 4.8–5.6)

## 2019-10-05 LAB — HEPATITIS C ANTIBODY: Hep C Virus Ab: <0.1 s/co ratio (ref 0.0–0.9)

## 2019-10-05 LAB — HIV ANTIBODY (ROUTINE TESTING W REFLEX): HIV Screen 4th Generation wRfx: NONREACTIVE

## 2019-10-18 ENCOUNTER — Ambulatory Visit: Payer: Self-pay | Admitting: Nurse Practitioner

## 2020-01-22 ENCOUNTER — Emergency Department (HOSPITAL_COMMUNITY): Payer: Medicaid Other

## 2020-01-22 ENCOUNTER — Encounter (HOSPITAL_COMMUNITY): Payer: Self-pay

## 2020-01-22 ENCOUNTER — Emergency Department (HOSPITAL_COMMUNITY)
Admission: EM | Admit: 2020-01-22 | Discharge: 2020-01-23 | Disposition: A | Discharge status: Home / Self Care | Payer: Medicaid Other | Attending: Emergency Medicine | Admitting: Emergency Medicine

## 2020-01-22 ENCOUNTER — Other Ambulatory Visit: Payer: Self-pay

## 2020-01-22 DIAGNOSIS — Z7951 Long term (current) use of inhaled steroids: Secondary | ICD-10-CM | POA: Insufficient documentation

## 2020-01-22 DIAGNOSIS — S29019A Strain of muscle and tendon of unspecified wall of thorax, initial encounter: Secondary | ICD-10-CM | POA: Diagnosis not present

## 2020-01-22 DIAGNOSIS — Y92410 Unspecified street and highway as the place of occurrence of the external cause: Secondary | ICD-10-CM | POA: Diagnosis not present

## 2020-01-22 DIAGNOSIS — R52 Pain, unspecified: Secondary | ICD-10-CM | POA: Diagnosis not present

## 2020-01-22 DIAGNOSIS — S199XXA Unspecified injury of neck, initial encounter: Secondary | ICD-10-CM | POA: Diagnosis present

## 2020-01-22 DIAGNOSIS — S161XXA Strain of muscle, fascia and tendon at neck level, initial encounter: Secondary | ICD-10-CM

## 2020-01-22 DIAGNOSIS — M542 Cervicalgia: Secondary | ICD-10-CM | POA: Diagnosis not present

## 2020-01-22 DIAGNOSIS — S29012A Strain of muscle and tendon of back wall of thorax, initial encounter: Secondary | ICD-10-CM | POA: Diagnosis not present

## 2020-01-22 DIAGNOSIS — Z87891 Personal history of nicotine dependence: Secondary | ICD-10-CM | POA: Insufficient documentation

## 2020-01-22 DIAGNOSIS — J45909 Unspecified asthma, uncomplicated: Secondary | ICD-10-CM | POA: Diagnosis not present

## 2020-01-22 DIAGNOSIS — M546 Pain in thoracic spine: Secondary | ICD-10-CM | POA: Diagnosis not present

## 2020-01-22 MED ORDER — NAPROXEN 500 MG PO TABS
500.0000 mg | ORAL_TABLET | Freq: Once | ORAL | Status: AC
  Administered 2020-01-23: 500 mg via ORAL
  Filled 2020-01-22: qty 1

## 2020-01-22 NOTE — ED Notes (Signed)
Pt repositioned and given a pillow for comfort.
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
## 2020-01-23 DIAGNOSIS — M542 Cervicalgia: Secondary | ICD-10-CM | POA: Diagnosis not present

## 2020-01-23 DIAGNOSIS — M546 Pain in thoracic spine: Secondary | ICD-10-CM | POA: Diagnosis not present

## 2020-01-23 MED ORDER — NAPROXEN 375 MG PO TABS: ORAL_TABLET | ORAL | 0 refills | Status: DC

## 2020-01-23 MED ORDER — CYCLOBENZAPRINE HCL 10 MG PO TABS: 10.0000 mg | ORAL_TABLET | Freq: Three times a day (TID) | ORAL | 0 refills | Status: DC | PRN

## 2020-01-23 MED ORDER — CYCLOBENZAPRINE HCL 10 MG PO TABS
10.0000 mg | ORAL_TABLET | Freq: Once | ORAL | Status: AC
  Administered 2020-01-23: 10 mg via ORAL
  Filled 2020-01-23: qty 1

## 2020-01-23 MED ORDER — NAPROXEN 500 MG PO TABS: 500.0000 mg | ORAL_TABLET | Freq: Once | ORAL | Status: DC

## 2020-01-25 ENCOUNTER — Encounter: Payer: Self-pay | Admitting: Nurse Practitioner

## 2020-02-01 ENCOUNTER — Encounter: Payer: Medicaid Other | Admitting: Nurse Practitioner

## 2020-03-20 DIAGNOSIS — Z23 Encounter for immunization: Secondary | ICD-10-CM | POA: Diagnosis not present

## 2020-06-26 ENCOUNTER — Ambulatory Visit: Payer: Medicaid Other | Admitting: Obstetrics

## 2020-06-29 ENCOUNTER — Other Ambulatory Visit (HOSPITAL_COMMUNITY)
Admission: RE | Admit: 2020-06-29 | Discharge: 2020-06-29 | Disposition: A | Discharge status: Home / Self Care | Payer: BC Managed Care – PPO | Source: Ambulatory Visit | Attending: Obstetrics | Admitting: Obstetrics

## 2020-06-29 ENCOUNTER — Ambulatory Visit (INDEPENDENT_AMBULATORY_CARE_PROVIDER_SITE_OTHER): Payer: BC Managed Care – PPO | Admitting: Obstetrics

## 2020-06-29 ENCOUNTER — Encounter: Payer: Self-pay | Admitting: Obstetrics

## 2020-06-29 ENCOUNTER — Other Ambulatory Visit: Payer: Self-pay

## 2020-06-29 VITALS — BP 118/84 | HR 67 | Wt 162.0 lb

## 2020-06-29 DIAGNOSIS — Z01419 Encounter for gynecological examination (general) (routine) without abnormal findings: Secondary | ICD-10-CM

## 2020-06-29 DIAGNOSIS — Z113 Encounter for screening for infections with a predominantly sexual mode of transmission: Secondary | ICD-10-CM

## 2020-06-29 DIAGNOSIS — Z3169 Encounter for other general counseling and advice on procreation: Secondary | ICD-10-CM | POA: Diagnosis not present

## 2020-06-29 DIAGNOSIS — N898 Other specified noninflammatory disorders of vagina: Secondary | ICD-10-CM | POA: Insufficient documentation

## 2020-06-29 LAB — POCT URINALYSIS DIPSTICK
Bilirubin, UA: NEGATIVE
Glucose, UA: NEGATIVE
Ketones, UA: NEGATIVE
Leukocytes, UA: NEGATIVE
Nitrite, UA: NEGATIVE
Protein, UA: POSITIVE — AB
Spec Grav, UA: 1.025 (ref 1.010–1.025)
Urobilinogen, UA: 0.2 E.U./dL
pH, UA: 6.5 (ref 5.0–8.0)

## 2020-06-29 NOTE — Progress Notes (Signed)
RGYN presents for problem visit today. Pt last seen 2018   Last pap: 2019 with primary .   LMP: 06/25/20-06/26/20 medium flow per  CC: Right sided Pelvic pain.and break through bleeding throughout the month.  During ovulation pt notes spotting  Vaginal discharge pt wants STD screening.

## 2020-06-29 NOTE — Progress Notes (Signed)
Subjective:        Kristen [name] is a 40 y.o. female here for a routine exam.  Current complaints: Vaginal discharge.  Lower abdominal pain when ovulating - RLQ.Marland Kitchen    Personal health questionnaire:  Is patient Ashkenazi Jewish, have a family history of breast and/or ovarian cancer: no Is there a family history of uterine cancer diagnosed at age < 9, gastrointestinal cancer, urinary tract cancer, family member who is a Field seismologist syndrome-associated carrier: no Is the patient overweight and hypertensive, family history of diabetes, personal history of gestational diabetes, preeclampsia or PCOS: no Is patient over 90, have PCOS,  family history of premature CHD under age 96, diabetes, smoke, have hypertension or peripheral artery disease:  no At any time, has a partner hit, kicked or otherwise hurt or frightened you?: no Over the past 2 weeks, have you felt down, depressed or hopeless?: no Over the past 2 weeks, have you felt little interest or pleasure in doing things?:no   Gynecologic History No LMP recorded. Contraception: none Last Pap: 2017. Results were: normal Last mammogram: n/a. Results were: n/a  Obstetric History OB History  Gravida Para Term Preterm AB Living  12 5 3 2 8 5   SAB IAB Ectopic Multiple Live Births  0 0 0 1 5    # Outcome Date GA Lbr Len/2nd Weight Sex Delivery Anes PTL Lv  12 Term 10/29/15 [redacted]w[redacted]d  5 lb 2.2 oz (2.33 kg) M CS-LTranv Spinal  LIV  11 Term 05/07/10 [redacted]w[redacted]d  7 lb 5 oz (3.317 kg) F Vag-Spont None N LIV  10 AB 01/2009 [redacted]w[redacted]d         9 Term 08/05/08 [redacted]w[redacted]d  7 lb 6 oz (3.345 kg) M Vag-Spont None N LIV  8 AB 09/2007 [redacted]w[redacted]d         7 AB 03/2006 [redacted]w[redacted]d         6 AB 06/2005 [redacted]w[redacted]d         5 AB 02/2005 [redacted]w[redacted]d         4 Preterm 02/22/04 [redacted]w[redacted]d  3 lb 11 oz (1.673 kg) F CS-LTranv Spinal Y LIV  3 AB 07/2002 [redacted]w[redacted]d    SAB     2 AB 04/23/02 [redacted]w[redacted]d    SAB     1A Preterm 07/17/01 [redacted]w[redacted]d  1 lb 5 oz (0.595 kg) F Vag-Spont None Y LIV     Complications: Preterm  contractions, second trimester  1B AB 04/2001 [redacted]w[redacted]d   M SAB       Past Medical History:  Diagnosis Date  . Alpha thalassemia ([place])   . Asthma    rescue inhaler- last use May 2017  . Complication of anesthesia   . Endometriosis   . Gestational diabetes    metformin and insulin  . Preterm labor with preterm delivery   . Tubular adenoma of colon     Past Surgical History:  Procedure Laterality Date  . CESAREAN SECTION    . CESAREAN SECTION N/A 10/29/2015   Procedure: CESAREAN SECTION;  Surgeon: Woodroe Mode, MD;  Location: [facility];  Service: Obstetrics;  Laterality: N/A;  . DILATION AND CURETTAGE OF UTERUS       Current Outpatient Medications:  .  albuterol (VENTOLIN HFA) 108 (90 Base) MCG/ACT inhaler, Inhale 2 puffs into the lungs every 4 (four) hours as needed for wheezing or shortness of breath. (Patient not taking: Reported on 06/29/2020), Disp: 18 g, Rfl: 1 .  cyclobenzaprine (FLEXERIL) 10 MG tablet, Take 1 tablet (  10 mg total) by mouth 3 (three) times daily as needed for muscle spasms. (Patient not taking: Reported on 06/29/2020), Disp: 20 tablet, Rfl: 0 .  naproxen (NAPROSYN) 375 MG tablet, Take 1 tablet twice daily as needed for pain. (Patient not taking: Reported on 06/29/2020), Disp: 20 tablet, Rfl: 0 .  omeprazole (PRILOSEC) 20 MG capsule, Take 1 capsule (20 mg total) by mouth daily. (Patient not taking: Reported on 06/29/2020), Disp: 30 capsule, Rfl: 1 Allergies  Allergen Reactions  . Hydrocodone Shortness Of Breath  . Morphine And Related Shortness Of Breath and Itching    Burning  . Penicillins Hives    Has patient had a PCN reaction causing immediate rash, facial/tongue/throat swelling, SOB or lightheadedness with hypotension: Yes Has patient had a PCN reaction causing severe rash involving mucus membranes or skin necrosis: Yes Has patient had a PCN reaction that required hospitalization: No Has patient had a PCN reaction occurring within the last 10 years:  Yes If all of the above answers are "NO", then may proceed with Cephalosporin use.   Marland Kitchen Percocet [Oxycodone-Acetaminophen] Anaphylaxis    Pt has taken hydromorphone without complications in the past    Social History   Tobacco Use  . Smoking status: Former Smoker    Types: Cigars  . Smokeless tobacco: Never Used  . Tobacco comment: occ  Substance Use Topics  . Alcohol use: No    Alcohol/week: 0.0 standard drinks    Family History  Problem Relation Age of Onset  . HIV Father   . Diabetes Maternal Grandmother   . Diabetes Paternal Grandmother   . Colon cancer Maternal Uncle   . Stomach cancer Neg Hx   . Rectal cancer Neg Hx   . Esophageal cancer Neg Hx   . Liver cancer Neg Hx       Review of Systems  Constitutional: negative for fatigue and weight loss Respiratory: negative for cough and wheezing Cardiovascular: negative for chest pain, fatigue and palpitations Gastrointestinal: negative for abdominal pain and change in bowel habits Musculoskeletal:negative for myalgias Neurological: negative for gait problems and tremors Behavioral/Psych: negative for abusive relationship, depression Endocrine: negative for temperature intolerance    Genitourinary: positive for vaginal discharge.  negative for abnormal menstrual periods, genital lesions, hot flashes, sexual problems  Integument/breast: negative for breast lump, breast tenderness, nipple discharge and skin lesion(s)    Objective:       BP 118/84   Pulse 67   Wt 162 lb (73.5 kg)   BMI 28.16 kg/m  General:   alert and no distress  Skin:   no rash or abnormalities  Lungs:   clear to auscultation bilaterally  Heart:   regular rate and rhythm, S1, S2 normal, no murmur, click, rub or gallop  Breasts:   normal without suspicious masses, skin or nipple changes or axillary nodes  Abdomen:  normal findings: no organomegaly, soft, non-tender and no hernia  Pelvis:  External genitalia: normal general appearance Urinary  system: urethral meatus normal and bladder without fullness, nontender Vaginal: normal without tenderness, induration or masses Cervix: normal appearance Adnexa: normal bimanual exam Uterus: anteverted and non-tender, normal size   Lab Review Urine pregnancy test Labs reviewed yes Radiologic studies reviewed no  I have spent a total of 20 minutes of face-to-face time, excluding clinical staff time, reviewing notes and preparing to see patient, ordering tests and/or medications, and counseling the patient.  Assessment:     1. Encounter for gynecological examination with Papanicolaou smear of cervix Rx: -  POCT Urinalysis Dipstick - Cytology - PAP( [place])  2. Vaginal discharge Rx: - Cervicovaginal ancillary only( [place])  3. Screen for STD (sexually transmitted disease) Rx: - Hepatitis B surface antigen - Hepatitis C antibody - HIV Antibody (routine testing w rflx) - RPR  4. Encounter for preconception consultation - encouraged healthy lifestyle, diet and exercise in preparation for pregnancy - importance of folic acid intake prior to pregnancy stressed   Plan:   Patient would like to conceive over the next 2 years  Education reviewed: calcium supplements, depression evaluation, low fat, low cholesterol diet, safe sex/STD prevention, self breast exams and weight bearing exercise. Follow up in: 1 year.    Orders Placed This Encounter  Procedures  . Hepatitis B surface antigen  . Hepatitis C antibody  . HIV Antibody (routine testing w rflx)  . RPR  . POCT Urinalysis Dipstick    Shelly Bombard, MD 06/29/2020 11:37 AM

## 2020-06-29 NOTE — Patient Instructions (Signed)
Preparing for Pregnancy If you are planning to become pregnant, talk to your health care provider about preconception care. This type of care helps you prepare for a safe and healthy pregnancy. During this visit, your health care provider will:  Do a complete physical exam, including a Pap test.  Take your complete medical history.  Give you information, answer your questions, and help you resolve problems. Preconception checklist Medical history  Tell your health care provider about any medical conditions you have or have had. Your pregnancy or your ability to become pregnant may be affected by long-term (chronic) conditions, such as: ? Diabetes. ? High blood pressure (hypertension). ? Thyroid problems.  Tell your health care provider about your family's medical history and your partner's medical history.  Tell your health care provider if you have or have had any sexually transmitted infections, orSTIs. These can affect your pregnancy. In some cases, they can be passed to your baby.  If needed, discuss the benefits of genetic testing. This test checks for conditions that may be passed from parent to child.  Tell your health care provider about: ? Any problems you had getting pregnant or while pregnant. ? Any medicines you take. These include vitamins, herbal supplements, and over-the-counter medicines. ? Your history of getting vaccines. Discuss any vaccines that you may need. Diet  Ask your health care provider about what foods to eat in order to get a balance of nutrients. This is especially important when you are pregnant or preparing to become pregnant. It is recommended that women of childbearing age take a folic acid supplement of 400 mcg daily and eat foods rich in folic acid to prevent certain birth defects.  Ask your health care provider to help you reach a healthy weight before pregnancy. ? If you are overweight, you may have a higher risk for certain problems. These  include hypertension, diabetes, and early (preterm) birth. ? If you are underweight, you are more likely to have a baby who has a low birth weight. Lifestyle, work, and home Let your health care provider know about:  Any lifestyle habits that you have, such as use of alcohol, drugs, or tobacco products.  Fun and leisure activities that may put you at risk during pregnancy, such as downhill skiing and certain exercise programs.  Any plans to travel out of the country, especially to places with an active [place] virus outbreak.  Harmful substances that you may be exposed to at work or at home. These include chemicals, pesticides, radiation, and substances from cat litter boxes.  Any concerns you have for your safety at home. Mental health Tell your health care provider about:  Any history of mental health conditions, including feelings of depression, sadness, or anxiety.  Any medicines that you take for a mental health condition. These include herbs and supplements. How do I know that I am pregnant? You may be pregnant if you have been sexually active and you miss your period. Other symptoms of early pregnancy include:  Mild cramping.  Very light vaginal bleeding (spotting).  Feeling more tired than usual.  Nausea and vomiting. These may be signs of morning sickness. Take a home pregnancy test if you have any of these symptoms. This test checks for a hormone in your urine called human chorionic gonadotropin, or hCG. A woman's body begins to make this hormone during early pregnancy. These tests are very accurate. Wait until at least the first day after you miss your period to take a home pregnancy  home pregnancy test. If the test shows that you are pregnant, call your health care provider for a prenatal care visit. What should I do if I become pregnant?  Schedule a visit with your health care provider as soon as you suspect you are pregnant.  Talk to your health care provider if you are taking  prescription medicines to determine if they are safe to take during pregnancy.  You may continue to have sex if it does not cause pain or other problems, such as vaginal bleeding. Follow these instructions at home: Eating and drinking  Follow instructions from your health care provider about eating or drinking restrictions.  Drink enough fluid to keep your urine pale yellow.  Eat a balanced diet. This includes fresh fruits and vegetables, whole grains, lean meats, low-fat dairy products, healthy fats, and foods that are high in fiber. Ask to meet with a nutritionist or registered dietitian for help with meal planning and goals.  Avoid eating raw or undercooked meat and seafood.  Avoid eating or drinking unpasteurized dairy products.   Lifestyle  Get regular exercise. Try to be active for at least 30 minutes a day on most days of the week. Ask your health care provider which activities are safe during pregnancy.  Maintain a healthy weight.  Avoid toxic fumes and chemicals.  Avoid cleaning cat litter boxes. Cat feces may contain a harmful parasite called toxoplasma.  Avoid travel to countries where Zika virus is common.  Do not use any products that contain nicotine or tobacco, such as cigarettes, e-cigarettes, and chewing tobacco. If you need help quitting, ask your health care provider.  Do not drink alcohol or use drugs.      General instructions  Keep an accurate record of your menstrual periods. This makes it easier for your health care provider to determine your baby's due date.  Take over-the-counter and prescription medicines only as told by your health care provider.  Begin taking prenatal vitamins and folic acid supplements daily as directed.  Manage any chronic conditions, such as hypertension and diabetes, as told by your health care provider. This is important. Summary  If you are planning to become pregnant, talk to your health care provider about preconception  care. This is an important part of planning for a healthy pregnancy.  Women of childbearing age should take 400 mcg of folic acid daily in addition to eating a diet rich in folic acid. This will prevent certain birth defects.  Schedule a visit with your health care provider as soon as you suspect you are pregnant. Tell your health care provider about your medical history, lifestyle activities, home safety, and other things that may concern you. This information is not intended to replace advice given to you by your health care provider. Make sure you discuss any questions you have with your health care provider. Document Revised: 11/10/2018 Document Reviewed: 11/10/2018 Elsevier Patient Education  2021 Elsevier Inc.  

## 2020-06-30 LAB — HEPATITIS B SURFACE ANTIGEN: Hepatitis B Surface Ag: NEGATIVE

## 2020-06-30 LAB — HIV ANTIBODY (ROUTINE TESTING W REFLEX): HIV Screen 4th Generation wRfx: NONREACTIVE

## 2020-06-30 LAB — RPR: RPR Ser Ql: NONREACTIVE

## 2020-06-30 LAB — HEPATITIS C ANTIBODY: Hep C Virus Ab: <0.1 s/co ratio (ref 0.0–0.9)

## 2020-07-02 LAB — CERVICOVAGINAL ANCILLARY ONLY
Chlamydia: NEGATIVE
Comment: NEGATIVE
Comment: NEGATIVE
Comment: NORMAL
Neisseria Gonorrhea: NEGATIVE
Trichomonas: NEGATIVE

## 2020-07-02 LAB — CYTOLOGY - PAP
Comment: NEGATIVE
Diagnosis: NEGATIVE
High risk HPV: NEGATIVE

## 2021-04-19 ENCOUNTER — Telehealth: Payer: Self-pay | Admitting: *Deleted
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
## 2021-07-24 ENCOUNTER — Ambulatory Visit: Payer: BC Managed Care – PPO | Admitting: Obstetrics

## 2021-08-16 ENCOUNTER — Ambulatory Visit: Payer: BC Managed Care – PPO | Admitting: Obstetrics

## 2022-01-15 ENCOUNTER — Telehealth: Payer: Self-pay

## 2022-01-21 ENCOUNTER — Encounter: Payer: Self-pay | Admitting: Family Medicine

## 2022-01-21 ENCOUNTER — Ambulatory Visit (INDEPENDENT_AMBULATORY_CARE_PROVIDER_SITE_OTHER): Payer: BC Managed Care – PPO | Admitting: Family Medicine

## 2022-01-21 VITALS — BP 124/86 | HR 69 | Ht 63.5 in | Wt 202.2 lb

## 2022-01-21 DIAGNOSIS — Z1231 Encounter for screening mammogram for malignant neoplasm of breast: Secondary | ICD-10-CM | POA: Diagnosis not present

## 2022-01-21 DIAGNOSIS — Z01419 Encounter for gynecological examination (general) (routine) without abnormal findings: Secondary | ICD-10-CM

## 2022-01-21 NOTE — Patient Instructions (Signed)
Please call to schedule your mammogram.   [facility],  [place]  [phone]

## 2022-01-21 NOTE — Progress Notes (Signed)
Pt presents for annual and has questions regarding recurrent SAB's.

## 2022-01-21 NOTE — Progress Notes (Signed)
GYNECOLOGY OFFICE VISIT NOTE  History:   Kristen [name] is a 41 y.o. [id number] here today for her annual gynecological exam. She denies any abnormal vaginal discharge, bleeding, pelvic pain or other concerns.    She just turned 41 and is due for a mammogram. Normal pap smear about 1 year ago. Not currently on contraceptives and is ok with that. LMP started on 11/26. She still has some spotting and declines a pelvic exam today.  Past Medical History:  Diagnosis Date   Alpha thalassemia ([place])    Asthma    rescue inhaler- last use May 0254   Complication of anesthesia    Endometriosis    Gestational diabetes    metformin and insulin   Preterm labor with preterm delivery    Tubular adenoma of colon     Past Surgical History:  Procedure Laterality Date   CESAREAN SECTION     CESAREAN SECTION N/A 10/29/2015   Procedure: CESAREAN SECTION;  Surgeon: Woodroe Mode, MD;  Location: [facility];  Service: Obstetrics;  Laterality: N/A;   DILATION AND CURETTAGE OF UTERUS      The following portions of the patient's history were reviewed and updated as appropriate: allergies, current medications, past family history, past medical history, past social history, past surgical history and problem list.   Health Maintenance:  Normal pap and negative HRHPV on 06/2020. Yet to start mammograms..    Review of Systems:  Pertinent items noted in HPI and remainder of comprehensive ROS otherwise negative.  Physical Exam:  BP 124/86   Pulse 69   Ht 5' 3.5" (1.613 m)   Wt 202 lb 3.2 oz (91.7 kg)   LMP 01/19/2022   BMI 35.26 kg/m  CONSTITUTIONAL: Well-developed, well-nourished female in no acute distress.  HEENT:  Normocephalic, atraumatic. External right and left ear normal. No scleral icterus.  NECK: Normal range of motion, supple, no masses noted on observation SKIN: No rash noted. Not diaphoretic. No erythema. No pallor. MUSCULOSKELETAL: Normal range of motion. No edema  noted. NEUROLOGIC: Alert and oriented to person, place, and time. Normal muscle tone coordination. No cranial nerve deficit noted. PSYCHIATRIC: Normal mood and affect. Normal behavior. Normal judgment and thought content. CARDIOVASCULAR: Normal heart rate noted RESPIRATORY: Effort and breath sounds normal, no problems with respiration noted BREAST: Exam performed with a chaperone.  Breasts are symmetrical bilaterally.  No skin changes.  No abnormal lumps on palpation, no nipple discharge.  No lymph nodes within the bilateral axilla. ABDOMEN: No masses noted. No other overt distention noted.   PELVIC:  declined  Labs and Imaging No results found for this or any previous visit (from the past 168 hour(s)). No results found.     Assessment and Plan:    1. Women's annual routine gynecological examination Doing well overall. No concerns today. UTD on her health  maintenance.  Initial mammogram ordered today. Normal breast exam.  - MM 3D SCREEN BREAST BILATERAL; Future  2. Encounter for screening mammogram for malignant neoplasm of breast - MM 3D SCREEN BREAST BILATERAL; Future  Routine preventative health maintenance measures emphasized. Please refer to After Visit Summary for other counseling recommendations.   No follow-ups on file.    I spent 20 minutes dedicated to the care of this patient including pre-visit review of records, face to face time with the patient discussing her conditions and treatments and post visit orders.  Liliane Channel MD MPH OB Fellow, [facility] 01/21/2022

## 2022-03-19 ENCOUNTER — Ambulatory Visit
Admission: RE | Admit: 2022-03-19 | Discharge: 2022-03-19 | Disposition: A | Discharge status: Home / Self Care | Payer: BC Managed Care – PPO | Source: Ambulatory Visit | Attending: Family Medicine | Admitting: Family Medicine

## 2022-03-19 DIAGNOSIS — Z01419 Encounter for gynecological examination (general) (routine) without abnormal findings: Secondary | ICD-10-CM

## 2022-03-19 DIAGNOSIS — Z1231 Encounter for screening mammogram for malignant neoplasm of breast: Secondary | ICD-10-CM

## 2022-07-09 ENCOUNTER — Encounter: Payer: Self-pay | Admitting: Internal Medicine

## 2022-07-09 ENCOUNTER — Ambulatory Visit (INDEPENDENT_AMBULATORY_CARE_PROVIDER_SITE_OTHER): Payer: BC Managed Care – PPO | Admitting: Internal Medicine

## 2022-07-09 VITALS — BP 120/84 | HR 82 | Temp 98.4°F | Ht 63.5 in | Wt 196.6 lb

## 2022-07-09 DIAGNOSIS — O0940 Supervision of pregnancy with grand multiparity, unspecified trimester: Secondary | ICD-10-CM

## 2022-07-09 DIAGNOSIS — Z6834 Body mass index (BMI) 34.0-34.9, adult: Secondary | ICD-10-CM

## 2022-07-09 DIAGNOSIS — E66811 Obesity, class 1: Secondary | ICD-10-CM

## 2022-07-09 DIAGNOSIS — E669 Obesity, unspecified: Secondary | ICD-10-CM | POA: Diagnosis not present

## 2022-07-09 DIAGNOSIS — R739 Hyperglycemia, unspecified: Secondary | ICD-10-CM

## 2022-07-09 DIAGNOSIS — Z8632 Personal history of gestational diabetes: Secondary | ICD-10-CM | POA: Diagnosis not present

## 2022-07-09 DIAGNOSIS — J302 Other seasonal allergic rhinitis: Secondary | ICD-10-CM | POA: Diagnosis not present

## 2022-07-09 DIAGNOSIS — O24419 Gestational diabetes mellitus in pregnancy, unspecified control: Secondary | ICD-10-CM

## 2022-07-09 DIAGNOSIS — Z0000 Encounter for general adult medical examination without abnormal findings: Secondary | ICD-10-CM | POA: Diagnosis not present

## 2022-07-09 DIAGNOSIS — Z1211 Encounter for screening for malignant neoplasm of colon: Secondary | ICD-10-CM

## 2022-07-09 LAB — POCT GLYCOSYLATED HEMOGLOBIN (HGB A1C): Hemoglobin A1C: 5.4 % (ref 4.0–5.6)

## 2022-07-09 LAB — LIPID PANEL
Cholesterol: 199 mg/dL (ref 0–200)
HDL: 63.9 mg/dL (ref >39.00)
LDL Cholesterol: 117 mg/dL — ABNORMAL HIGH (ref 0–99)
NonHDL: 134.82
Total CHOL/HDL Ratio: 3
Triglycerides: 89 mg/dL (ref 0.0–149.0)
VLDL: 17.8 mg/dL (ref 0.0–40.0)

## 2022-07-09 LAB — COMPREHENSIVE METABOLIC PANEL
ALT: 19 U/L (ref 0–35)
AST: 24 U/L (ref 0–37)
Albumin: 4 g/dL (ref 3.5–5.2)
Alkaline Phosphatase: 63 U/L (ref 39–117)
BUN: 13 mg/dL (ref 6–23)
CO2: 27 mEq/L (ref 19–32)
Calcium: 9.1 mg/dL (ref 8.4–10.5)
Chloride: 104 mEq/L (ref 96–112)
Creatinine, Ser: 1.06 mg/dL (ref 0.40–1.20)
GFR: 65.69 mL/min (ref >60.00)
Glucose, Bld: 81 mg/dL (ref 70–99)
Potassium: 3.6 mEq/L (ref 3.5–5.1)
Sodium: 139 mEq/L (ref 135–145)
Total Bilirubin: 0.2 mg/dL (ref 0.2–1.2)
Total Protein: 7.4 g/dL (ref 6.0–8.3)

## 2022-07-09 LAB — VITAMIN B12: Vitamin B-12: 1113 pg/mL — ABNORMAL HIGH (ref 211–911)

## 2022-07-09 LAB — VITAMIN D 25 HYDROXY (VIT D DEFICIENCY, FRACTURES): VITD: 23.74 ng/mL — ABNORMAL LOW (ref 30.00–100.00)

## 2022-07-09 LAB — TSH: TSH: 0.46 u[IU]/mL (ref 0.35–5.50)

## 2022-07-09 LAB — MICROALBUMIN / CREATININE URINE RATIO
Creatinine,U: 64.5 mg/dL
Microalb Creat Ratio: 1.1 mg/g (ref 0.0–30.0)
Microalb, Ur: <0.7 mg/dL (ref 0.0–1.9)

## 2022-07-09 LAB — HEMOGLOBIN A1C: Hgb A1c MFr Bld: 5.8 % (ref 4.6–6.5)

## 2022-07-09 MED ORDER — CETIRIZINE HCL 10 MG PO TABS: 10.0000 mg | ORAL_TABLET | Freq: Every day | ORAL | 11 refills | Status: DC

## 2022-07-09 NOTE — Progress Notes (Signed)
New Patient Office Visit     CC/Reason for Visit: Establish care, annual preventive exam Previous PCP: None Last Visit: Unknown  HPI: Kristen [name] is a 42 y.o. female who is coming in today for the above mentioned reasons. Past Medical History is significant for: Gestational diabetes during her last pregnancy in 2017 as well as seasonal allergies.  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  She has 6 kids ages 30-6.  She was a smoker but quit 6 months ago, she drinks alcohol occasionally, her past surgical history significant for C-section x 2.  Family history is very positive for diabetes with parents and multiple grandparents with it.  She had a colonoscopy in 2018 and was due for 5-year follow-up due to polyps.  She follows routinely with GYN for mammograms and Pap smears.   Past Medical/Surgical History: Past Medical History:  Diagnosis Date   Alpha thalassemia ([facility])    Asthma    rescue inhaler- last use May 2017   Complication of anesthesia    Endometriosis    Gestational diabetes    metformin and insulin   Preterm labor with preterm delivery    Tubular adenoma of colon     Past Surgical History:  Procedure Laterality Date   CESAREAN SECTION     CESAREAN SECTION N/A 10/29/2015   Procedure: CESAREAN SECTION;  Surgeon: Adam Phenix, MD;  Location: [facility] BIRTHING SUIT[facility];  Service: Obstetrics;  Laterality: N/A;   DILATION AND CURETTAGE OF UTERUS      Social History:  reports that she has quit smoking. Her smoking use included cigars. She has never been exposed to tobacco smoke. She has never used smokeless tobacco. She reports that she does not drink alcohol and does not use drugs.  Allergies: Allergies  Allergen Reactions   Hydrocodone Shortness Of Breath   Morphine And Codeine Shortness Of Breath and Itching    Burning   Penicillins Hives    Has patient had a PCN reaction causing immediate rash, facial/tongue/throat swelling, SOB or lightheadedness with  hypotension: Yes Has patient had a PCN reaction causing severe rash involving mucus membranes or skin necrosis: Yes Has patient had a PCN reaction that required hospitalization: No Has patient had a PCN reaction occurring within the last 10 years: Yes If all of the above answers are "NO", then may proceed with Cephalosporin use.    Percocet [Oxycodone-Acetaminophen] Anaphylaxis    Pt has taken hydromorphone without complications in the past    Family History:  Family History  Problem Relation Age of Onset   HIV Father    Diabetes Maternal Grandmother    Diabetes Paternal Grandmother    Colon cancer Maternal Uncle    Stomach cancer Neg Hx    Rectal cancer Neg Hx    Esophageal cancer Neg Hx    Liver cancer Neg Hx      Current Outpatient Medications:    albuterol (VENTOLIN HFA) 108 (90 Base) MCG/ACT inhaler, Inhale 2 puffs into the lungs every 4 (four) hours as needed for wheezing or shortness of breath., Disp: 18 g, Rfl: 1   cetirizine (ZYRTEC) 10 MG tablet, Take 1 tablet (10 mg total) by mouth daily., Disp: 30 tablet, Rfl: 11  Review of Systems:  Negative except as indicated in HPI.   Physical Exam: Vitals:   07/09/22 1308  BP: 120/84  Pulse: 82  Temp: 98.4 F (36.9 C)  TempSrc: Oral  SpO2: 98%  Weight: 196 lb 9.6 oz (89.2  kg)  Height: 5' 3.5" (1.613 m)   Body mass index is 34.28 kg/m.  Physical Exam Vitals reviewed.  Constitutional:      General: She is not in acute distress.    Appearance: Normal appearance. She is not ill-appearing, toxic-appearing or diaphoretic.  HENT:     Head: Normocephalic.     Right Ear: Tympanic membrane, ear canal and external ear normal. There is no impacted cerumen.     Left Ear: Tympanic membrane, ear canal and external ear normal. There is no impacted cerumen.     Nose: Nose normal.     Mouth/Throat:     Mouth: Mucous membranes are moist.     Pharynx: Oropharynx is clear. No oropharyngeal exudate or posterior oropharyngeal  erythema.  Eyes:     General: No scleral icterus.       Right eye: No discharge.        Left eye: No discharge.     Conjunctiva/sclera: Conjunctivae normal.     Pupils: Pupils are equal, round, and reactive to light.  Neck:     Vascular: No carotid bruit.  Cardiovascular:     Rate and Rhythm: Normal rate and regular rhythm.     Pulses: Normal pulses.     Heart sounds: Normal heart sounds.  Pulmonary:     Effort: Pulmonary effort is normal. No respiratory distress.     Breath sounds: Normal breath sounds.  Abdominal:     General: Abdomen is flat. Bowel sounds are normal.     Palpations: Abdomen is soft.  Musculoskeletal:        General: Normal range of motion.     Cervical back: Normal range of motion.  Skin:    General: Skin is warm and dry.  Neurological:     General: No focal deficit present.     Mental Status: She is alert and oriented to person, place, and time. Mental status is at baseline.  Psychiatric:        Mood and Affect: Mood normal.        Behavior: Behavior normal.        Thought Content: Thought content normal.        Judgment: Judgment normal.     Flowsheet Row Office Visit from 07/09/2022 in [facility] [place]  PHQ-9 Total Score 5        Impression and Plan:  Encounter for preventive health examination  Hyperglycemia -     POCT glycosylated hemoglobin (Hb A1C) -     Microalbumin / creatinine urine ratio  Screening for malignant neoplasm of colon -     Ambulatory referral to Gastroenterology  Gestational diabetes mellitus (GDM) affecting fifth pregnancy -     Hemoglobin A1c; Future  Obesity (BMI 30.0-34.9) -     CBC with Differential/Platelet; Future -     Comprehensive metabolic panel; Future -     Lipid panel; Future -     TSH; Future -     Vitamin B12; Future -     VITAMIN D 25 Hydroxy (Vit-D Deficiency, Fractures); Future  Seasonal allergies -     Cetirizine HCl; Take 1 tablet (10 mg total) by mouth daily.   Dispense: 30 tablet; Refill: 11    -Recommend routine eye and dental care. -Healthy lifestyle discussed in detail. -Labs to be updated today. -Prostate cancer screening: N/A Health Maintenance  Topic Date Due   COVID-19 Vaccine (1) Never done   Yearly kidney health urinalysis for diabetes  Never done   Complete foot exam   03/18/2019   Eye exam for diabetics  09/25/2022*   Yearly kidney function blood test for diabetes  10/09/2022*   Colon Cancer Screening  07/09/2023*   Flu Shot  09/25/2022   Hemoglobin A1C  01/09/2023   Pap Smear  06/30/2023   DTaP/Tdap/Td vaccine (2 - Td or Tdap) 10/31/2025   Hepatitis C Screening: USPSTF Recommendation to screen - Ages 15-79 yo.  Completed   HIV Screening  Completed   HPV Vaccine  Aged Out  *Topic was postponed. The date shown is not the original due date.    -Refer back to GI for screening colonoscopy.    Chaya Jan, MD [facility] at [facility]

## 2022-07-10 ENCOUNTER — Encounter: Payer: Self-pay | Admitting: Internal Medicine

## 2022-07-10 ENCOUNTER — Other Ambulatory Visit: Payer: Self-pay | Admitting: Internal Medicine

## 2022-07-10 ENCOUNTER — Ambulatory Visit (INDEPENDENT_AMBULATORY_CARE_PROVIDER_SITE_OTHER): Payer: BC Managed Care – PPO

## 2022-07-10 DIAGNOSIS — R718 Other abnormality of red blood cells: Secondary | ICD-10-CM

## 2022-07-10 DIAGNOSIS — E559 Vitamin D deficiency, unspecified: Secondary | ICD-10-CM

## 2022-07-10 DIAGNOSIS — D560 Alpha thalassemia: Secondary | ICD-10-CM | POA: Insufficient documentation

## 2022-07-10 LAB — IBC + FERRITIN
Ferritin: 20.8 ng/mL (ref 10.0–291.0)
Iron: 47 ug/dL (ref 42–145)
Saturation Ratios: 15.1 % — ABNORMAL LOW (ref 20.0–50.0)
TIBC: 310.8 ug/dL (ref 250.0–450.0)
Transferrin: 222 mg/dL (ref 212.0–360.0)

## 2022-07-10 LAB — CBC WITH DIFFERENTIAL/PLATELET
Basophils Absolute: 0.1 10*3/uL (ref 0.0–0.1)
Basophils Relative: 0.7 % (ref 0.0–3.0)
Eosinophils Absolute: 0.1 10*3/uL (ref 0.0–0.7)
Eosinophils Relative: 1.2 % (ref 0.0–5.0)
HCT: 38.9 % (ref 36.0–46.0)
Hemoglobin: 13 g/dL (ref 12.0–15.0)
Lymphocytes Relative: 29.6 % (ref 12.0–46.0)
Lymphs Abs: 2.4 10*3/uL (ref 0.7–4.0)
MCHC: 33.4 g/dL (ref 30.0–36.0)
MCV: 71.2 fl — ABNORMAL LOW (ref 78.0–100.0)
Monocytes Absolute: 0.6 10*3/uL (ref 0.1–1.0)
Monocytes Relative: 7.3 % (ref 3.0–12.0)
Neutro Abs: 4.9 10*3/uL (ref 1.4–7.7)
Neutrophils Relative %: 61.2 % (ref 43.0–77.0)
Platelets: 116 10*3/uL — ABNORMAL LOW (ref 150.0–400.0)
RBC: 5.47 Mil/uL — ABNORMAL HIGH (ref 3.87–5.11)
RDW: 14.6 % (ref 11.5–15.5)
WBC: 8 10*3/uL (ref 4.0–10.5)

## 2022-07-10 MED ORDER — VITAMIN D (ERGOCALCIFEROL) 1.25 MG (50000 UNIT) PO CAPS: 50000.0000 [IU] | ORAL_CAPSULE | ORAL | 0 refills | Status: AC

## 2022-08-13 ENCOUNTER — Ambulatory Visit (INDEPENDENT_AMBULATORY_CARE_PROVIDER_SITE_OTHER): Payer: BC Managed Care – PPO | Admitting: Internal Medicine

## 2022-08-13 ENCOUNTER — Encounter: Payer: Self-pay | Admitting: Internal Medicine

## 2022-08-13 VITALS — BP 120/84 | HR 90 | Temp 98.7°F | Wt 198.6 lb

## 2022-08-13 DIAGNOSIS — Z8709 Personal history of other diseases of the respiratory system: Secondary | ICD-10-CM

## 2022-08-13 DIAGNOSIS — S39011A Strain of muscle, fascia and tendon of abdomen, initial encounter: Secondary | ICD-10-CM

## 2022-08-13 DIAGNOSIS — L309 Dermatitis, unspecified: Secondary | ICD-10-CM

## 2022-08-13 MED ORDER — TRIAMCINOLONE ACETONIDE 0.1 % EX CREA: 1.0000 | TOPICAL_CREAM | Freq: Two times a day (BID) | CUTANEOUS | 0 refills | Status: DC

## 2022-08-13 MED ORDER — ALBUTEROL SULFATE HFA 108 (90 BASE) MCG/ACT IN AERS: 2.0000 | INHALATION_SPRAY | RESPIRATORY_TRACT | 1 refills | Status: AC | PRN

## 2022-08-13 NOTE — Progress Notes (Signed)
Established Patient Office Visit     CC/Reason for Visit: Right abdominal wall muscle strain  HPI: Kristen [name] is a 42 y.o. female who is coming in today for the above mentioned reasons.  She works at [employer].  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  She visited her Workmen's Comp. and was told she had some torn muscle fibers and was advised to take ibuprofen, Aleve and Tylenol.  She wonders if she can use an abdominal binder she has from her previous C-section.  While still painful with twisting movements of her torso, it has improved from last week.  She is also requesting refills of albuterol and triamcinolone cream.   Past Medical/Surgical History: Past Medical History:  Diagnosis Date   Alpha thalassemia ([facility])    Asthma    rescue inhaler- last use May 2017   Complication of anesthesia    Endometriosis    Gestational diabetes    metformin and insulin   Preterm labor with preterm delivery    Tubular adenoma of colon     Past Surgical History:  Procedure Laterality Date   CESAREAN SECTION     CESAREAN SECTION N/A 10/29/2015   Procedure: CESAREAN SECTION;  Surgeon: Adam Phenix, MD;  Location: [facility]IRTHING SUIT[facility];  Service: Obstetrics;  Laterality: N/A;   DILATION AND CURETTAGE OF UTERUS      Social History:  reports that she has quit smoking. Her smoking use included cigars. She has never been exposed to tobacco smoke. She has never used smokeless tobacco. She reports that she does not drink alcohol and does not use drugs.  Allergies: Allergies  Allergen Reactions   Hydrocodone Shortness Of Breath   Morphine And Codeine Shortness Of Breath and Itching    Burning   Penicillins Hives    Has patient had a PCN reaction causing immediate rash, facial/tongue/throat swelling, SOB or lightheadedness with hypotension: Yes Has patient had a PCN reaction  causing severe rash involving mucus membranes or skin necrosis: Yes Has patient had a PCN reaction that required hospitalization: No Has patient had a PCN reaction occurring within the last 10 years: Yes If all of the above answers are "NO", then may proceed with Cephalosporin use.    Percocet [Oxycodone-Acetaminophen] Anaphylaxis    Pt has taken hydromorphone without complications in the past    Family History:  Family History  Problem Relation Age of Onset   HIV Father    Diabetes Maternal Grandmother    Diabetes Paternal Grandmother    Colon cancer Maternal Uncle    Stomach cancer Neg Hx    Rectal cancer Neg Hx    Esophageal cancer Neg Hx    Liver cancer Neg Hx      Current Outpatient Medications:    cetirizine (ZYRTEC) 10 MG tablet, Take 1 tablet (10 mg total) by mouth daily., Disp: 30 tablet, Rfl: 11   triamcinolone cream (KENALOG) 0.1 %, Apply 1 Application topically 2 (two) times daily., Disp: 30 g, Rfl: 0   Vitamin D, Ergocalciferol, (DRISDOL) 1.25 MG (50000 UNIT) CAPS capsule, Take 1 capsule (50,000 Units total) by mouth every 7 (seven) days for 12 doses., Disp: 12 capsule, Rfl: 0   albuterol (VENTOLIN HFA) 108 (90 Base) MCG/ACT inhaler, Inhale 2 puffs into the lungs every 4 (four) hours as needed for wheezing or shortness of breath., Disp: 18 g, Rfl:  1  Review of Systems:  Negative unless indicated in HPI.   Physical Exam: Vitals:   08/13/22 1010  BP: 120/84  Pulse: 90  Temp: 98.7 F (37.1 C)  TempSrc: Oral  SpO2: 98%  Weight: 198 lb 9.6 oz (90.1 kg)    Body mass index is 34.63 kg/m.   Physical Exam Vitals reviewed.  Constitutional:      Appearance: Normal appearance.  HENT:     Head: Normocephalic and atraumatic.  Eyes:     Conjunctiva/sclera: Conjunctivae normal.     Pupils: Pupils are equal, round, and reactive to light.  Skin:    General: Skin is warm and dry.  Neurological:     General: No focal deficit present.     Mental Status: She is  alert and oriented to person, place, and time.  Psychiatric:        Mood and Affect: Mood normal.        Behavior: Behavior normal.        Thought Content: Thought content normal.        Judgment: Judgment normal.      Impression and Plan:  Strain of abdominal wall, initial encounter  History of asthma -     Albuterol Sulfate HFA; Inhale 2 puffs into the lungs every 4 (four) hours as needed for wheezing or shortness of breath.  Dispense: 18 g; Refill: 1  Eczema, unspecified type -     Triamcinolone Acetonide; Apply 1 Application topically 2 (two) times daily.  Dispense: 30 g; Refill: 0  -Continue use of Tylenol and Aleve over the next week or 2, can use abdominal binder as needed.  Hot water soaks might prove beneficial as well.   Time spent:23 minutes reviewing chart, interviewing and examining patient and formulating plan of care.     Chaya Jan, MD [facility] at [facility]

## 2022-08-15 ENCOUNTER — Encounter: Payer: Self-pay | Admitting: Internal Medicine

## 2022-08-15 DIAGNOSIS — S39011A Strain of muscle, fascia and tendon of abdomen, initial encounter: Secondary | ICD-10-CM

## 2022-09-01 ENCOUNTER — Encounter: Payer: Self-pay | Admitting: Internal Medicine

## 2022-09-01 ENCOUNTER — Ambulatory Visit (INDEPENDENT_AMBULATORY_CARE_PROVIDER_SITE_OTHER): Payer: BC Managed Care – PPO | Admitting: Internal Medicine

## 2022-09-01 VITALS — BP 110/84 | HR 70 | Temp 98.1°F | Wt 201.0 lb

## 2022-09-01 DIAGNOSIS — S39011D Strain of muscle, fascia and tendon of abdomen, subsequent encounter: Secondary | ICD-10-CM

## 2022-09-01 NOTE — Progress Notes (Signed)
Established Patient Office Visit     CC/Reason for Visit: Continued pain from abdominal wall strain.  HPI: Kristen [name] is a 42 y.o. female who is coming in today for the above mentioned reasons.  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  She works at [employer].  It appears that [employer]. denied her claim.  She does have short-term disability and needs paperwork filled out for this.  She has improved from onset.  She will be seeing physical therapy on July 11.   Past Medical/Surgical History: Past Medical History:  Diagnosis Date   Alpha thalassemia ([facility])    Asthma    rescue inhaler- last use May 2017   Complication of anesthesia    Endometriosis    Gestational diabetes    metformin and insulin   Preterm labor with preterm delivery    Tubular adenoma of colon     Past Surgical History:  Procedure Laterality Date   CESAREAN SECTION     CESAREAN SECTION N/A 10/29/2015   Procedure: CESAREAN SECTION;  Surgeon: Adam Phenix, MD;  Location: [facility]THING SU[facility];  Service: Obstetrics;  Laterality: N/A;   DILATION AND CURETTAGE OF UTERUS      Social History:  reports that she has quit smoking. Her smoking use included cigars. She has never been exposed to tobacco smoke. She has never used smokeless tobacco. She reports that she does not drink alcohol and does not use drugs.  Allergies: Allergies  Allergen Reactions   Hydrocodone Shortness Of Breath   Morphine And Codeine Shortness Of Breath and Itching    Burning   Penicillins Hives    Has patient had a PCN reaction causing immediate rash, facial/tongue/throat swelling, SOB or lightheadedness with hypotension: Yes Has patient had a PCN reaction causing severe rash involving mucus membranes or skin necrosis: Yes Has patient had a PCN reaction that required hospitalization: No Has patient had a PCN reaction occurring within the  last 10 years: Yes If all of the above answers are "NO", then may proceed with Cephalosporin use.    Percocet [Oxycodone-Acetaminophen] Anaphylaxis    Pt has taken hydromorphone without complications in the past    Family History:  Family History  Problem Relation Age of Onset   HIV Father    Diabetes Maternal Grandmother    Diabetes Paternal Grandmother    Colon cancer Maternal Uncle    Stomach cancer Neg Hx    Rectal cancer Neg Hx    Esophageal cancer Neg Hx    Liver cancer Neg Hx      Current Outpatient Medications:    albuterol (VENTOLIN HFA) 108 (90 Base) MCG/ACT inhaler, Inhale 2 puffs into the lungs every 4 (four) hours as needed for wheezing or shortness of breath., Disp: 18 g, Rfl: 1   cetirizine (ZYRTEC) 10 MG tablet, Take 1 tablet (10 mg total) by mouth daily., Disp: 30 tablet, Rfl: 11   triamcinolone cream (KENALOG) 0.1 %, Apply 1 Application topically 2 (two) times daily., Disp: 30 g, Rfl: 0   Vitamin D, Ergocalciferol, (DRISDOL) 1.25 MG (50000 UNIT) CAPS capsule, Take 1 capsule (50,000 Units total) by mouth every 7 (seven) days for 12 doses., Disp: 12 capsule, Rfl: 0  Review of Systems:  Negative unless indicated in HPI.   Physical Exam: Vitals:   09/01/22 0952  BP: 110/84  Pulse: 70  Temp: 98.1 F (  36.7 C)  TempSrc: Oral  SpO2: 99%  Weight: 201 lb (91.2 kg)    Body mass index is 35.05 kg/m.   Physical Exam Vitals reviewed.  Constitutional:      Appearance: Normal appearance.  HENT:     Head: Normocephalic and atraumatic.  Eyes:     Conjunctiva/sclera: Conjunctivae normal.     Pupils: Pupils are equal, round, and reactive to light.  Cardiovascular:     Rate and Rhythm: Normal rate and regular rhythm.  Pulmonary:     Effort: Pulmonary effort is normal.     Breath sounds: Normal breath sounds.  Skin:    General: Skin is warm and dry.  Neurological:     General: No focal deficit present.     Mental Status: She is alert and oriented to  person, place, and time.  Psychiatric:        Mood and Affect: Mood normal.        Behavior: Behavior normal.        Thought Content: Thought content normal.        Judgment: Judgment normal.      Impression and Plan:  Strain of abdominal wall, subsequent encounter   -She has her first appointment with physical therapy in 2 days.  I have completed short-term disability forms with an estimated return to work date of August 9.  This can be adjusted sooner or later depending on physical therapy evaluation. -She can continue to use anti-inflammatories and muscle relaxers as needed.  Time spent:31 minutes reviewing chart, interviewing and examining patient and formulating plan of care.     Chaya Jan, MD [facility] at [facility]

## 2022-09-02 NOTE — Therapy (Addendum)
OUTPATIENT PHYSICAL THERAPY THORACOLUMBAR EVALUATION   Patient Name: Kristen [name] MRN: [id number] DOB:1980-04-17, 42 y.o., female Today's Date: 09/04/2022  END OF SESSION:  PT End of Session - 09/04/22 1110     Visit Number 1    Number of Visits 17    Date for PT Re-Evaluation 11/07/22    Authorization Type BCBS COMM PPO/[place] MEDICAID HEALTHY BLUE    PT Start Time 1104    PT Stop Time 1150    PT Time Calculation (min) 46 min    Activity Tolerance Patient tolerated treatment well    Behavior During Therapy WFL for tasks assessed/performed             Past Medical History:  Diagnosis Date   Alpha thalassemia (HCC)    Asthma    rescue inhaler- last use May 2017   Complication of anesthesia    Endometriosis    Gestational diabetes    metformin and insulin   Preterm labor with preterm delivery    Tubular adenoma of colon    Past Surgical History:  Procedure Laterality Date   CESAREAN SECTION     CESAREAN SECTION N/A 10/29/2015   Procedure: CESAREAN SECTION;  Surgeon: Adam Phenix, MD;  Location: [facility] BIRTHING SUITES;  Service: Obstetrics;  Laterality: N/A;   DILATION AND CURETTAGE OF UTERUS     Patient Active Problem List   Diagnosis Date Noted   Vitamin D deficiency 07/10/2022   Microcytosis 07/10/2022   Alpha thalassemia (HCC) 07/10/2022   Obesity (BMI 30.0-34.9) 07/09/2022   Hypnagogic hallucinations 10/22/2016   Gestational diabetes mellitus (GDM) affecting fifth pregnancy 10/22/2016   Unwanted fertility 09/17/2016   Bowel trouble 02/07/2016   Slow transit constipation 02/07/2016    PCP: Philip Aspen, Limmie Patricia, MD   REFERRING PROVIDER: Philip Aspen, Limmie Patricia, MD   REFERRING DIAG: [phone] (ICD-10-CM) - Strain of abdominal wall, initial encounter   Rationale for Evaluation and Treatment: Rehabilitation  THERAPY DIAG:  Pain in thoracic spine  Cramp and spasm  ONSET DATE: June 14th, 2024  SUBJECTIVE:                                                                                                                                                                                            SUBJECTIVE STATEMENT: Pt reports she strained her R ant/lat chest/abdominal area when lifting a package at [employer]. Pt notes the pain can radiate to her R back and her R upper shoulder hurts as well. At the time of the injury, the pt states the pain was intense and she felt like she was going to throw up. Pt indicates  there was an area of swelling which ran along her ant/lat chest from under her R breast to her last rib. Following the injury, she worked on [employer] duty (small sort) for 1 week. Currently she is out of work on [employer]. She can return to work on light duty when she can tolerate lifting 15 lbs for 3.5 hours. In addition to pain, pt reports muscle spasms in the area. Pt has 6 children.  Pt is Rt handed  PERTINENT HISTORY:  NA  PAIN:  Are you having pain? Yes: NPRS scale: 4/10 Pain location: R ant/lat chest/abdominal area, radiates to R back, R upper shoulder Pain description: sharp, ache , throb, burning, spasms Aggravating factors: Driving Relieving factors: Cold pack, hot bath soaks Pain range on eval: 3-8/10  PRECAUTIONS: None  WEIGHT BEARING RESTRICTIONS: No  FALLS:  Has patient fallen in last 6 months? 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 Pt's need to be able to lift 75# to return to full duty  PLOF: Independent  PATIENT GOALS: Pain relief and to return to work  OBJECTIVE:   DIAGNOSTIC FINDINGS:  NA  PATIENT SURVEYS:  FOTO: Perceived function   31%, predicted   57%   SCREENING FOR RED FLAGS: Bowel or bladder incontinence:   COGNITION: Overall cognitive status: Within functional limits for tasks  assessed     SENSATION: WFL  MUSCLE LENGTH: Hamstrings: Right NT deg; Left NT deg Maisie Fus test: Right NT deg; Left NT deg  POSTURE: rounded shoulders and forward head  PALPATION: TTP to the ant/lat R rib cage from the 5th to 12th ribs and of the R upper trap  UE MMT:  MMT Right 09/04/2022 Left 09/04/2022  Shoulder flexion 4p 5  Shoulder extension 4p 5  Shoulder abduction 4p 5  Shoulder adduction 4p 5  Shoulder extension 4p 5  Shoulder internal rotation 4p 5  Shoulder external rotation 4p 5  Elbow flexion 5 5  Elbow extension 5 5  Wrist flexion    Wrist extension    Wrist ulnar deviation    Wrist radial deviation    Wrist pronation    Wrist supination     (Blank rows = not tested) P=denotes concordant pain  UE ROM:  AROM Right 09/04/2022 Left 09/04/2022  Shoulder flexion 100 150  Shoulder extension    Shoulder abduction 100 150  Shoulder adduction    Shoulder extension    Shoulder internal rotation    Shoulder external rotation    Middle trapezius    Lower trapezius    Elbow flexion    Elbow extension    Wrist flexion    Wrist extension    Wrist ulnar deviation    Wrist radial deviation    Wrist pronation    Wrist supination    Grip strength     (Blank rows = not tested)  TODAY'S TREATMENT:      [facility] Adult PT Treatment:                                                DATE: 09/04/22 Therapeutic Exercise: Developed, instructed in, and pt completed therex as noted in HEP  Self Care: Use of cold pack as needed for  pain relief                                                                                                                        PATIENT EDUCATION:  Education details: Eval findings, POC, HEP, self care  Person educated: Patient Education method: Explanation, Demonstration, Tactile cues, Verbal cues, and Handouts Education comprehension: verbalized understanding, returned demonstration, verbal cues required, tactile cues required, and needs  further education  HOME EXERCISE PROGRAM: Access Code: [email or web address] URL: [email or web address] Date: 09/04/2022 Prepared by: Joellyn Rued  Exercises - Standing 'L' Stretch at Counter  - 2 x daily - 7 x weekly - 1 sets - 10 reps - 5-20 hold - Seated Shoulder Flexion Towel Slide at Table Top Full Range of Motion  - 2 x daily - 7 x weekly - 1 sets - 10 reps - 5-20 hold  ASSESSMENT:  CLINICAL IMPRESSION: Patient is a 42 y.o. female who was seen today for physical therapy evaluation and treatment for S39.011A (ICD-10-CM) - Strain of abdominal wall, initial encounter.   OBJECTIVE IMPAIRMENTS: decreased activity tolerance, decreased ROM, decreased strength, increased muscle spasms, and pain.   ACTIVITY LIMITATIONS: carrying, lifting, dressing, reach over head, and caring for others  PARTICIPATION LIMITATIONS: meal prep, cleaning, laundry, driving, occupation, and yard work  PERSONAL FACTORS: Time since onset of injury/illness/exacerbation are also affecting patient's functional outcome.   REHAB POTENTIAL: Excellent  CLINICAL DECISION MAKING: Stable/uncomplicated  EVALUATION COMPLEXITY: Low   GOALS:  SHORT TERM GOALS: Target date: 09/26/22  Pt will be Ind in an initial HEP  Baseline: started Goal status: INITIAL  2.  Pt will voice understanding of measures to assist in pain reduction  Baseline: started Goal status: INITIAL  LONG TERM GOALS: Target date: 11/07/22  Pt will be Ind in a final HEP to maintain achieved LOF  Baseline: started Goal status: INITIAL  2.  Increase R shoulder flexion and abd to 145d for appropriate upper body function Baseline: 100d for both Goal status: INITIAL  3.  Pt will report a decrease in pain to 0-3/10 with daily and work related activities form improved QOL and to return back to work Baseline: 3-8/10 Goal status: INITIAL  4.  Increase R shoulder strength to 4+/5 or greater for improved R UE function for return to work  Baseline:  100d for both Goal status: INITIAL  5.  Pt will be able to handle 15#, lifting from floor to waist height and from one waist height surface to another for 2 set of 10 each Baseline: not tested Goal status: INITIAL  6.  Pt will be able to handle 75#, lifting from floor to waist height and from one waist height surface to another for 2 sets of 3 each Baseline: not tested Goal status: INITIAL  7.  Pt's FOTO score will improved to the predicted value of 57% as indication of improved function  Baseline: 31% Goal status: INITIAL  PLAN:  PT FREQUENCY: 2x/week  PT DURATION: 8 weeks  PLANNED INTERVENTIONS: Therapeutic exercises, Therapeutic activity, Patient/Family education, Self Care, Joint mobilization, Dry Needling, Electrical stimulation, Cryotherapy, Moist heat, Taping, Ultrasound, Ionotophoresis 4mg /ml Dexamethasone, Manual therapy, and Re-evaluation.  PLAN FOR NEXT SESSION: Review FOTO; assess response to HEP; progress therex as indicated; use of modalities, manual therapy; and TPDN as indicated.  Aden Youngman MS, PT 09/04/22 3:28 PM   Check all possible CPT codes: 16109 - PT Re-evaluation, 97110- Therapeutic Exercise, 97140 - Manual Therapy, 97530 - Therapeutic Activities, 97535 - Self Care, [phone] - Electrical stimulation (Manual), [id number] - Ultrasound, and [id number] - Aquatic therapy    Check all conditions that are expected to impact treatment: {Conditions expected to impact treatment:Morbid obesity and Musculoskeletal disorders   If treatment provided at initial evaluation, no treatment charged due to lack of authorization.    Gareth Fitzner MS, PT 09/13/22 10:26 PM

## 2022-09-04 ENCOUNTER — Ambulatory Visit: Payer: BC Managed Care – PPO | Attending: Internal Medicine

## 2022-09-04 ENCOUNTER — Other Ambulatory Visit: Payer: Self-pay

## 2022-09-04 DIAGNOSIS — R252 Cramp and spasm: Secondary | ICD-10-CM | POA: Diagnosis not present

## 2022-09-04 DIAGNOSIS — M546 Pain in thoracic spine: Secondary | ICD-10-CM

## 2022-09-04 DIAGNOSIS — M6281 Muscle weakness (generalized): Secondary | ICD-10-CM | POA: Insufficient documentation

## 2022-09-04 DIAGNOSIS — S39011A Strain of muscle, fascia and tendon of abdomen, initial encounter: Secondary | ICD-10-CM | POA: Insufficient documentation

## 2022-09-09 ENCOUNTER — Encounter: Payer: Self-pay | Admitting: Physical Therapy

## 2022-09-10 ENCOUNTER — Ambulatory Visit: Payer: Self-pay

## 2022-09-10 ENCOUNTER — Ambulatory Visit: Payer: BC Managed Care – PPO

## 2022-09-10 DIAGNOSIS — M6281 Muscle weakness (generalized): Secondary | ICD-10-CM | POA: Diagnosis not present

## 2022-09-10 DIAGNOSIS — R252 Cramp and spasm: Secondary | ICD-10-CM

## 2022-09-10 DIAGNOSIS — M546 Pain in thoracic spine: Secondary | ICD-10-CM

## 2022-09-10 DIAGNOSIS — S39011A Strain of muscle, fascia and tendon of abdomen, initial encounter: Secondary | ICD-10-CM | POA: Diagnosis not present

## 2022-09-10 NOTE — Therapy (Signed)
OUTPATIENT PHYSICAL THERAPY THORACOLUMBAR EVALUATION   Patient Name: Kristen [name] MRN: [id number] DOB:April 16, 1980, 42 y.o., female Today's Date: 09/10/2022  END OF SESSION:  PT End of Session - 09/10/22 1030     Visit Number 2    Number of Visits 17    Date for PT Re-Evaluation 11/07/22    Authorization Type BCBS COMM PPO/[place] MEDICAID HEALTHY BLUE    PT Start Time 1025    PT Stop Time 1105    PT Time Calculation (min) 40 min    Activity Tolerance Patient tolerated treatment well    Behavior During Therapy WFL for tasks assessed/performed              Past Medical History:  Diagnosis Date   Alpha thalassemia (HCC)    Asthma    rescue inhaler- last use May 2017   Complication of anesthesia    Endometriosis    Gestational diabetes    metformin and insulin   Preterm labor with preterm delivery    Tubular adenoma of colon    Past Surgical History:  Procedure Laterality Date   CESAREAN SECTION     CESAREAN SECTION N/A 10/29/2015   Procedure: CESAREAN SECTION;  Surgeon: Adam Phenix, MD;  Location: [facility] BIRTHING SUITES;  Service: Obstetrics;  Laterality: N/A;   DILATION AND CURETTAGE OF UTERUS     Patient Active Problem List   Diagnosis Date Noted   Vitamin D deficiency 07/10/2022   Microcytosis 07/10/2022   Alpha thalassemia (HCC) 07/10/2022   Obesity (BMI 30.0-34.9) 07/09/2022   Hypnagogic hallucinations 10/22/2016   Gestational diabetes mellitus (GDM) affecting fifth pregnancy 10/22/2016   Unwanted fertility 09/17/2016   Bowel trouble 02/07/2016   Slow transit constipation 02/07/2016    PCP: Philip Aspen, Limmie Patricia, MD   REFERRING PROVIDER: Philip Aspen, Limmie Patricia, MD   REFERRING DIAG: [phone] (ICD-10-CM) - Strain of abdominal wall, initial encounter   Rationale for Evaluation and Treatment: Rehabilitation  THERAPY DIAG:  Pain in thoracic spine  Cramp and spasm  ONSET DATE: June 14th, 2024  SUBJECTIVE:                                                                                                                                                                                            SUBJECTIVE STATEMENT: The pain is about the same. I've been more active with household responsibilities.  EVAL: Pt reports she strained her R ant/lat chest/abdominal area when lifting a package at [employer]. Pt notes the pain can radiate to her R back and her R upper shoulder hurts as well. At the time of the injury, the pt states  the pain was intense and she felt like she was going to throw up. Pt indicates there was an area of swelling which ran along her ant/lat chest from under her R breast to her last rib. Following the injury, she worked on [employer] duty (small sort) for 1 week. Currently she is out of work on [employer]. She can return to work on light duty when she can tolerate lifting 15 lbs for 3.5 hours. In addition to pain, pt reports muscle spasms in the area. Pt has 6 children.  Pt is Rt handed  PERTINENT HISTORY:  NA  PAIN:  Are you having pain? Yes: NPRS scale: 5/10 Pain location: R ant/lat chest/abdominal area, radiates to R back, R upper shoulder Pain description: sharp, ache , throb, burning, spasms Aggravating factors: Driving Relieving factors: Cold pack, hot bath soaks Pain range on eval: 3-8/10  PRECAUTIONS: None  WEIGHT BEARING RESTRICTIONS: No  FALLS:  Has patient fallen in last 6 months? 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 Pt's need to be able to lift 75# to return to full duty  PLOF: Independent  PATIENT GOALS: Pain relief and to return to work  OBJECTIVE:   DIAGNOSTIC FINDINGS:  NA  PATIENT SURVEYS:  FOTO: Perceived function   31%, predicted   57%   SCREENING FOR RED FLAGS: Bowel or bladder incontinence:    COGNITION: Overall cognitive status: Within functional limits for tasks assessed     SENSATION: WFL  MUSCLE LENGTH: Hamstrings: Right NT deg; Left NT deg Maisie Fus test: Right NT deg; Left NT deg  POSTURE: rounded shoulders and forward head  PALPATION: TTP to the ant/lat R rib cage from the 5th to 12th ribs and of the R upper trap  UE MMT:  MMT Right 09/10/2022 Left 09/10/2022  Shoulder flexion 4p 5  Shoulder extension 4p 5  Shoulder abduction 4p 5  Shoulder adduction 4p 5  Shoulder extension 4p 5  Shoulder internal rotation 4p 5  Shoulder external rotation 4p 5  Elbow flexion 5 5  Elbow extension 5 5  Wrist flexion    Wrist extension    Wrist ulnar deviation    Wrist radial deviation    Wrist pronation    Wrist supination     (Blank rows = not tested) P=denotes concordant pain  UE ROM:  AROM Right 09/10/2022 Left 09/10/2022  Shoulder flexion 100 150  Shoulder extension    Shoulder abduction 100 150  Shoulder adduction    Shoulder extension    Shoulder internal rotation    Shoulder external rotation    Middle trapezius    Lower trapezius    Elbow flexion    Elbow extension    Wrist flexion    Wrist extension    Wrist ulnar deviation    Wrist radial deviation    Wrist pronation    Wrist supination    Grip strength     (Blank rows = not tested)  TODAY'S TREATMENT:    [facility] Adult PT Treatment:                                                DATE: 09/10/22 Therapeutic Exercise: Standing 'L' Stretch at doorway 5x 10"  Standing Shoulder Flexion 10x 3" PPT x15 3" Supine shoulder flex/ext c dowel c PPT Palloff off press c double RTB x10 each Self Care: Soft foam roller on wall massage Self massage c L hand    [facility] Adult PT Treatment:                                                DATE: 09/04/22 Therapeutic Exercise: Developed, instructed in, and pt completed therex as noted in HEP  Self Care: Use of cold pack as needed for pain relief                                                                                                                         PATIENT EDUCATION:  Education details: Eval findings, POC, HEP, self care  Person educated: Patient Education method: Explanation, Demonstration, Tactile cues, Verbal cues, and Handouts Education comprehension: verbalized understanding, returned demonstration, verbal cues required, tactile cues required, and needs further education  HOME EXERCISE PROGRAM: Access Code: [email or web address] URL: [email or web address] Date: 09/10/2022 Prepared by: Joellyn Rued  Exercises - Standing 'L' Stretch at Counter  - 2 x daily - 7 x weekly - 1 sets - 10 reps - 5-20 hold - Shoulder Flexion Wall Slide with Towel  - 2 x daily - 7 x weekly - 1 sets - 10 reps - 5=10 hold - Supine Posterior Pelvic Tilt  - 2 x daily - 7 x weekly - 1 sets - 10 reps - 3 hold - Supine Shoulder Flexion Extension AAROM with Dowel  - 2 x daily - 7 x weekly - 1 sets - 10 reps - Standing Anti-Rotation Press with Anchored Resistance  - 2 x daily - 7 x weekly - 1 sets - 10 reps - 3 hold  ASSESSMENT:  CLINICAL IMPRESSION: PT was completed for therex to address flexibility and light strengthening of the R abdominals. Max verbal and tactile cueing was needed for for pt to complete a PPT correctly. Pt's HEP was updated. Pt tolerated the prescribed therex today without adverse effects. Pt will continue to benefit from skilled PT to address impairments for improved function with less pain.    OBJECTIVE IMPAIRMENTS: decreased activity tolerance, decreased ROM, decreased strength, increased muscle spasms, and pain.   ACTIVITY LIMITATIONS: carrying, lifting, dressing, reach over head, and caring for others  PARTICIPATION LIMITATIONS: meal prep, cleaning, laundry, driving, occupation, and yard work  PERSONAL FACTORS: Time since onset of injury/illness/exacerbation are also affecting patient's functional outcome.   REHAB POTENTIAL:  Excellent  CLINICAL DECISION MAKING: Stable/uncomplicated  EVALUATION COMPLEXITY: Low   GOALS:  SHORT TERM GOALS: Target date: 09/26/22  Pt will be Ind in an initial HEP  Baseline: started Goal status: INITIAL  2.  Pt will voice understanding of measures to assist in pain reduction  Baseline: started Goal status: INITIAL  LONG TERM  GOALS: Target date: 11/07/22  Pt will be Ind in a final HEP to maintain achieved LOF  Baseline: started Goal status: INITIAL  2.  Increase R shoulder flexion and abd to 145d for appropriate upper body function Baseline: 100d for both Goal status: INITIAL  3.  Pt will report a decrease in pain to 0-3/10 with daily and work related activities form improved QOL and to return back to work Baseline: 3-8/10 Goal status: INITIAL  4.  Increase R shoulder strength to 4+/5 or greater for improved R UE function for return to work  Baseline: 100d for both Goal status: INITIAL  5.  Pt will be able to handle 15#, lifting from floor to waist height and from one waist height surface to another for 2 set of 10 each Baseline: not tested Goal status: INITIAL  6.  Pt will be able to handle 75#, lifting from floor to waist height and from one waist height surface to another for 2 sets of 3 each Baseline: not tested Goal status: INITIAL  7.  Pt's FOTO score will improved to the predicted value of 57% as indication of improved function  Baseline: 31% Goal status: INITIAL  PLAN:  PT FREQUENCY: 2x/week  PT DURATION: 8 weeks  PLANNED INTERVENTIONS: Therapeutic exercises, Therapeutic activity, Patient/Family education, Self Care, Joint mobilization, Dry Needling, Electrical stimulation, Cryotherapy, Moist heat, Taping, Ultrasound, Ionotophoresis 4mg /ml Dexamethasone, Manual therapy, and Re-evaluation.  PLAN FOR NEXT SESSION: Review FOTO; assess response to HEP; progress therex as indicated; use of modalities, manual therapy; and TPDN as indicated.  Salome Hautala  MS, PT 09/10/22 1:46 PM

## 2022-09-16 ENCOUNTER — Encounter: Payer: Self-pay | Admitting: Physical Therapy

## 2022-09-16 ENCOUNTER — Ambulatory Visit: Payer: BC Managed Care – PPO | Admitting: Physical Therapy

## 2022-09-16 DIAGNOSIS — M546 Pain in thoracic spine: Secondary | ICD-10-CM

## 2022-09-16 DIAGNOSIS — M6281 Muscle weakness (generalized): Secondary | ICD-10-CM | POA: Diagnosis not present

## 2022-09-16 DIAGNOSIS — R252 Cramp and spasm: Secondary | ICD-10-CM | POA: Diagnosis not present

## 2022-09-16 DIAGNOSIS — S39011A Strain of muscle, fascia and tendon of abdomen, initial encounter: Secondary | ICD-10-CM | POA: Diagnosis not present

## 2022-09-16 NOTE — Therapy (Signed)
OUTPATIENT PHYSICAL THERAPY THORACOLUMBAR EVALUATION   Patient Name: Kristen [name] MRN: [id number] DOB:November 22, 1980, 42 y.o., female Today's Date: 09/16/2022  END OF SESSION:  PT End of Session - 09/16/22 1151     Visit Number 3    Number of Visits 17    Date for PT Re-Evaluation 11/07/22    Authorization Type BCBS COMM PPO/[place] MEDICAID HEALTHY BLUE    PT Start Time 1148    PT Stop Time 1228    PT Time Calculation (min) 40 min              Past Medical History:  Diagnosis Date   Alpha thalassemia (HCC)    Asthma    rescue inhaler- last use May 2017   Complication of anesthesia    Endometriosis    Gestational diabetes    metformin and insulin   Preterm labor with preterm delivery    Tubular adenoma of colon    Past Surgical History:  Procedure Laterality Date   CESAREAN SECTION     CESAREAN SECTION N/A 10/29/2015   Procedure: CESAREAN SECTION;  Surgeon: Adam Phenix, MD;  Location: [facility] BIRTHING SUITES;  Service: Obstetrics;  Laterality: N/A;   DILATION AND CURETTAGE OF UTERUS     Patient Active Problem List   Diagnosis Date Noted   Vitamin D deficiency 07/10/2022   Microcytosis 07/10/2022   Alpha thalassemia (HCC) 07/10/2022   Obesity (BMI 30.0-34.9) 07/09/2022   Hypnagogic hallucinations 10/22/2016   Gestational diabetes mellitus (GDM) affecting fifth pregnancy 10/22/2016   Unwanted fertility 09/17/2016   Bowel trouble 02/07/2016   Slow transit constipation 02/07/2016    PCP: Philip Aspen, Limmie Patricia, MD   REFERRING PROVIDER: Philip Aspen, Limmie Patricia, MD   REFERRING DIAG: [phone] (ICD-10-CM) - Strain of abdominal wall, initial encounter   Rationale for Evaluation and Treatment: Rehabilitation  THERAPY DIAG:  Pain in thoracic spine  Cramp and spasm  ONSET DATE: June 14th, 2024  SUBJECTIVE:                                                                                                                                                                                            SUBJECTIVE STATEMENT: I've been in more pain at night. I have difficulty with sleep positions. I am trying to do more and I can feel more soreness. I had increased pain with driving to [place].    EVAL: Pt reports she strained her R ant/lat chest/abdominal area when lifting a package at [employer]. Pt notes the pain can radiate to her R back and her R upper shoulder hurts as well. At the time of the injury,  the pt states the pain was intense and she felt like she was going to throw up. Pt indicates there was an area of swelling which ran along her ant/lat chest from under her R breast to her last rib. Following the injury, she worked on [employer] duty (small sort) for 1 week. Currently she is out of work on [employer]. She can return to work on light duty when she can tolerate lifting 15 lbs for 3.5 hours. In addition to pain, pt reports muscle spasms in the area. Pt has 6 children.  Pt is Rt handed  PERTINENT HISTORY:  NA  PAIN:  Are you having pain? Yes: NPRS scale: 6-7/10 Pain location: R ant/lat chest/abdominal area, radiates to R back, R upper shoulder Pain description: sharp, ache , throb, burning, spasms Aggravating factors: Driving Relieving factors: Cold pack, hot bath soaks Pain range on eval: 3-8/10  PRECAUTIONS: None  WEIGHT BEARING RESTRICTIONS: No  FALLS:  Has patient fallen in last 6 months? 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 Pt's need to be able to lift 75# to return to full duty  PLOF: Independent  PATIENT GOALS: Pain relief and to return to work  OBJECTIVE:   DIAGNOSTIC FINDINGS:  NA  PATIENT SURVEYS:  FOTO: Perceived function   31%, predicted   57%   SCREENING FOR RED FLAGS: Bowel or bladder incontinence:   COGNITION: Overall  cognitive status: Within functional limits for tasks assessed     SENSATION: WFL  MUSCLE LENGTH: Hamstrings: Right NT deg; Left NT deg Maisie Fus test: Right NT deg; Left NT deg  POSTURE: rounded shoulders and forward head  PALPATION: TTP to the ant/lat R rib cage from the 5th to 12th ribs and of the R upper trap  UE MMT:  MMT Right 09/16/2022 Left 09/16/2022  Shoulder flexion 4p 5  Shoulder extension 4p 5  Shoulder abduction 4p 5  Shoulder adduction 4p 5  Shoulder extension 4p 5  Shoulder internal rotation 4p 5  Shoulder external rotation 4p 5  Elbow flexion 5 5  Elbow extension 5 5  Wrist flexion    Wrist extension    Wrist ulnar deviation    Wrist radial deviation    Wrist pronation    Wrist supination     (Blank rows = not tested) P=denotes concordant pain  UE ROM:  AROM Right 09/16/2022 Left 09/16/2022  Shoulder flexion 100 150  Shoulder extension    Shoulder abduction 100 150  Shoulder adduction    Shoulder extension    Shoulder internal rotation    Shoulder external rotation    Middle trapezius    Lower trapezius    Elbow flexion    Elbow extension    Wrist flexion    Wrist extension    Wrist ulnar deviation    Wrist radial deviation    Wrist pronation    Wrist supination    Grip strength     (Blank rows = not tested)  TODAY'S TREATMENT:    [facility] Adult PT Treatment:                                                DATE: 09/16/22 Therapeutic Exercise: Seated upper trap stretch  Seated levator stretch  Shoulder flexion walk back  Shoulder flexion wall slides with towel  Palloff press green x 10 each  Supine side lying shoulder abduction Supine side lying shoulder flexion Hook lying dowel shoulder flexion  Self Care: Sleep positioning: Supine with legs elevated on tall bolster and right shoulder supported with pillow   [facility] Adult PT Treatment:                                                DATE: 09/10/22 Therapeutic Exercise: Standing 'L' Stretch  at doorway 5x 10" Standing Shoulder Flexion 10x 3" PPT x15 3" Supine shoulder flex/ext c dowel c PPT Palloff off press c double RTB x10 each Self Care: Soft foam roller on wall massage Self massage c L hand    [facility] Adult PT Treatment:                                                DATE: 09/04/22 Therapeutic Exercise: Developed, instructed in, and pt completed therex as noted in HEP  Self Care: Use of cold pack as needed for pain relief                                                                                                                        PATIENT EDUCATION:  Education details: Eval findings, POC, HEP, self care  Person educated: Patient Education method: Explanation, Demonstration, Tactile cues, Verbal cues, and Handouts Education comprehension: verbalized understanding, returned demonstration, verbal cues required, tactile cues required, and needs further education  HOME EXERCISE PROGRAM: Access Code: [email or web address] URL: [email or web address] Date: 09/10/2022 Prepared by: Joellyn Rued  Exercises - Standing 'L' Stretch at Counter  - 2 x daily - 7 x weekly - 1 sets - 10 reps - 5-20 hold - Shoulder Flexion Wall Slide with Towel  - 2 x daily - 7 x weekly - 1 sets - 10 reps - 5=10 hold - Supine Posterior Pelvic Tilt  - 2 x daily - 7 x weekly - 1 sets - 10 reps - 3 hold - Supine Shoulder Flexion Extension AAROM with Dowel  - 2 x daily - 7 x weekly - 1 sets - 10 reps - Standing Anti-Rotation Press with Anchored Resistance  - 2 x daily - 7 x weekly - 1 sets - 10 reps - 3 hold  ASSESSMENT:  CLINICAL IMPRESSION: Pt reports increased symptoms today and contributes it to exercise and increased activity. She reports increased right upper trap/neck and eye strain upon arrival. Added upper trap and levator stretches today with pt reporting decreased right neck and eye strain at end of session. Continued with shoulder ROM. She did well in side lying for shoulder ROM.   Pt  tolerated the prescribed therex today without adverse effects. Pt will continue to benefit from skilled PT to address impairments for improved function with less pain.    OBJECTIVE IMPAIRMENTS: decreased activity tolerance, decreased ROM, decreased strength, increased muscle spasms, and pain.   ACTIVITY LIMITATIONS: carrying, lifting, dressing, reach over head, and caring for others  PARTICIPATION LIMITATIONS: meal prep, cleaning, laundry, driving, occupation, and yard work  PERSONAL FACTORS: Time since onset of injury/illness/exacerbation are also affecting patient's functional outcome.   REHAB POTENTIAL: Excellent  CLINICAL DECISION MAKING: Stable/uncomplicated  EVALUATION COMPLEXITY: Low   GOALS:  SHORT TERM GOALS: Target date: 09/26/22  Pt will be Ind in an initial HEP  Baseline: started Goal status: INITIAL  2.  Pt will voice understanding of measures to assist in pain reduction  Baseline: started Goal status: INITIAL  LONG TERM GOALS: Target date: 11/07/22  Pt will be Ind in a final HEP to maintain achieved LOF  Baseline: started Goal status: INITIAL  2.  Increase R shoulder flexion and abd to 145d for appropriate upper body function Baseline: 100d for both Goal status: INITIAL  3.  Pt will report a decrease in pain to 0-3/10 with daily and work related activities form improved QOL and to return back to work Baseline: 3-8/10 Goal status: INITIAL  4.  Increase R shoulder strength to 4+/5 or greater for improved R UE function for return to work  Baseline: 100d for both Goal status: INITIAL  5.  Pt will be able to handle 15#, lifting from floor to waist height and from one waist height surface to another for 2 set of 10 each Baseline: not tested Goal status: INITIAL  6.  Pt will be able to handle 75#, lifting from floor to waist height and from one waist height surface to another for 2 sets of 3 each Baseline: not tested Goal status: INITIAL  7.  Pt's FOTO score  will improved to the predicted value of 57% as indication of improved function  Baseline: 31% Goal status: INITIAL  PLAN:  PT FREQUENCY: 2x/week  PT DURATION: 8 weeks  PLANNED INTERVENTIONS: Therapeutic exercises, Therapeutic activity, Patient/Family education, Self Care, Joint mobilization, Dry Needling, Electrical stimulation, Cryotherapy, Moist heat, Taping, Ultrasound, Ionotophoresis 4mg /ml Dexamethasone, Manual therapy, and Re-evaluation.  PLAN FOR NEXT SESSION: Review FOTO; assess response to HEP; progress therex as indicated; use of modalities, manual therapy; and TPDN as indicated.  Jannette Spanner, PTA 09/16/22 12:52 PM Phone: [phone] Fax: [phone]

## 2022-09-16 NOTE — Therapy (Signed)
OUTPATIENT PHYSICAL THERAPY THORACOLUMBAR TREATMENT NOTE   Patient Name: Kristen [name] MRN: [id number] DOB:1980-10-13, 42 y.o., female Today's Date: 09/18/2022  END OF SESSION:  PT End of Session - 09/18/22 1705     Visit Number 4    Number of Visits 17    Date for PT Re-Evaluation 11/07/22    Authorization Type BCBS COMM PPO/[place] MEDICAID HEALTHY BLUE    PT Start Time 1503    PT Stop Time 1550    PT Time Calculation (min) 47 min    Activity Tolerance Patient tolerated treatment well    Behavior During Therapy WFL for tasks assessed/performed               Past Medical History:  Diagnosis Date   Alpha thalassemia (HCC)    Asthma    rescue inhaler- last use May 2017   Complication of anesthesia    Endometriosis    Gestational diabetes    metformin and insulin   Preterm labor with preterm delivery    Tubular adenoma of colon    Past Surgical History:  Procedure Laterality Date   CESAREAN SECTION     CESAREAN SECTION N/A 10/29/2015   Procedure: CESAREAN SECTION;  Surgeon: Adam Phenix, MD;  Location: [facility] BIRTHING SUITES;  Service: Obstetrics;  Laterality: N/A;   DILATION AND CURETTAGE OF UTERUS     Patient Active Problem List   Diagnosis Date Noted   Vitamin D deficiency 07/10/2022   Microcytosis 07/10/2022   Alpha thalassemia (HCC) 07/10/2022   Obesity (BMI 30.0-34.9) 07/09/2022   Hypnagogic hallucinations 10/22/2016   Gestational diabetes mellitus (GDM) affecting fifth pregnancy 10/22/2016   Unwanted fertility 09/17/2016   Bowel trouble 02/07/2016   Slow transit constipation 02/07/2016    PCP: Philip Aspen, Limmie Patricia, MD   REFERRING PROVIDER: Philip Aspen, Limmie Patricia, MD   REFERRING DIAG: [phone] (ICD-10-CM) - Strain of abdominal wall, initial encounter   Rationale for Evaluation and Treatment: Rehabilitation  THERAPY DIAG:  Pain in thoracic spine  Cramp and spasm  ONSET DATE: June 14th, 2024  SUBJECTIVE:                                                                                                                                                                                            SUBJECTIVE STATEMENT: Pt reports she is frustrated that she is still in pain and she does not know what activities are going to set off the pain. Pt has obtained a knee bolster which she hopes will help her to sleep better. The upper shoulder exs are helping the tightness and pain there.Marland Kitchen  EVAL: Pt reports  she strained her R ant/lat chest/abdominal area when lifting a package at [employer]. Pt notes the pain can radiate to her R back and her R upper shoulder hurts as well. At the time of the injury, the pt states the pain was intense and she felt like she was going to throw up. Pt indicates there was an area of swelling which ran along her ant/lat chest from under her R breast to her last rib. Following the injury, she worked on [employer] duty (small sort) for 1 week. Currently she is out of work on [employer]. She can return to work on light duty when she can tolerate lifting 15 lbs for 3.5 hours. In addition to pain, pt reports muscle spasms in the area. Pt has 6 children.  Pt is Rt handed  PERTINENT HISTORY:  NA  PAIN:  Are you having pain? Yes: NPRS scale: 6/10 Pain location: R ant/lat chest/abdominal area, radiates to R back, R upper shoulder Pain description: sharp, ache , throb, burning, spasms Aggravating factors: Driving Relieving factors: Cold pack, hot bath soaks Pain range on eval: 3-8/10  PRECAUTIONS: None  WEIGHT BEARING RESTRICTIONS: No  FALLS:  Has patient fallen in last 6 months? 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 Pt's need to be able to lift 75# to return to full duty  PLOF: Independent  PATIENT GOALS: Pain relief and to return to  work  OBJECTIVE:   DIAGNOSTIC FINDINGS:  NA  PATIENT SURVEYS:  FOTO: Perceived function   31%, predicted   57%   SCREENING FOR RED FLAGS: Bowel or bladder incontinence:   COGNITION: Overall cognitive status: Within functional limits for tasks assessed     SENSATION: WFL  MUSCLE LENGTH: Hamstrings: Right NT deg; Left NT deg Maisie Fus test: Right NT deg; Left NT deg  POSTURE: rounded shoulders and forward head  PALPATION: TTP to the ant/lat R rib cage from the 5th to 12th ribs and of the R upper trap  UE MMT:  MMT Right 09/04/22 Left 09/04/22  Shoulder flexion 4p 5  Shoulder extension 4p 5  Shoulder abduction 4p 5  Shoulder adduction 4p 5  Shoulder extension 4p 5  Shoulder internal rotation 4p 5  Shoulder external rotation 4p 5  Elbow flexion 5 5  Elbow extension 5 5  Wrist flexion    Wrist extension    Wrist ulnar deviation    Wrist radial deviation    Wrist pronation    Wrist supination     (Blank rows = not tested) P=denotes concordant pain  UE ROM:  AROM Right 09/04/22 Left 09/04/22  Shoulder flexion 100 150  Shoulder extension    Shoulder abduction 100 150  Shoulder adduction    Shoulder extension    Shoulder internal rotation    Shoulder external rotation    Middle trapezius    Lower trapezius    Elbow flexion    Elbow extension    Wrist flexion    Wrist extension    Wrist ulnar deviation    Wrist radial deviation    Wrist pronation    Wrist supination    Grip strength     (Blank rows = not tested)  TODAY'S TREATMENT:  [facility] Adult PT Treatment:  DATE: 09/18/22 Therapeutic Exercise: PPT x10 3 sec Marching with PPT Supine shoulder flex/ext c dowel  Rib cage breathing Seated upper trap stretch  Seated levator stretch  Manual Therapy: STM c MTPR to the R upper trap and levator    [facility] Adult PT Treatment:                                                DATE: 09/16/22 Therapeutic  Exercise: Seated upper trap stretch  Seated levator stretch  Shoulder flexion walk back  Shoulder flexion wall slides with towel  Palloff press green x 10 each  Supine side lying shoulder abduction Supine side lying shoulder flexion Hook lying dowel shoulder flexion  Self Care: Sleep positioning: Supine with legs elevated on tall bolster and right shoulder supported with pillow   [facility] Adult PT Treatment:                                                DATE: 09/10/22 Therapeutic Exercise: Standing 'L' Stretch at doorway 5x 10" Standing Shoulder Flexion 10x 3" PPT x15 3" Supine shoulder flex/ext c dowel c PPT Palloff off press c double RTB x10 each Self Care: Soft foam roller on wall massage Self massage c L hand    [facility] Adult PT Treatment:                                                DATE: 09/04/22 Therapeutic Exercise: Developed, instructed in, and pt completed therex as noted in HEP  Self Care: Use of cold pack as needed for pain relief                                                                                                                        PATIENT EDUCATION:  Education details: Eval findings, POC, HEP, self care  Person educated: Patient Education method: Explanation, Demonstration, Tactile cues, Verbal cues, and Handouts Education comprehension: verbalized understanding, returned demonstration, verbal cues required, tactile cues required, and needs further education  HOME EXERCISE PROGRAM: Access Code: [email or web address] URL: [email or web address] Date: 09/18/2022 Prepared by: Joellyn Rued  Exercises - Standing 'L' Stretch at Counter  - 2 x daily - 7 x weekly - 1 sets - 10 reps - 5-20 hold - Shoulder Flexion Wall Slide with Towel  - 2 x daily - 7 x weekly - 1 sets - 10 reps - 5=10 hold - Supine Posterior Pelvic Tilt  - 2 x daily - 7 x weekly - 1 sets - 10 reps - 3 hold - Supine Shoulder Flexion Extension AAROM with Dowel  -  2 x daily - 7 x weekly - 1  sets - 10 reps - Standing Anti-Rotation Press with Anchored Resistance  - 2 x daily - 7 x weekly - 1 sets - 10 reps - 3 hold - Seated Upper Trapezius Stretch  - 1 x daily - 7 x weekly - 1 sets - 3 reps - 15-30 hold - Gentle Levator Scapulae Stretch  - 1 x daily - 7 x weekly - 1 sets - 3 reps - 15-30 hold - Hooklying Rib Cage Breathing  - 2 x daily - 7 x weekly - 2 sets - 5 reps - 5 hold - Supine March  - 2 x daily - 7 x weekly - 3 sets - 10 reps  ASSESSMENT:  CLINICAL IMPRESSION: Core strengthening was completed without a rotation component to see if the pt tolerates strengthening in this manner better. Manual therapy was completed as noted above. Taut muscle bands and TrPs were palpated. Discussed TPDN as an option for treating this area. Pt is considering. At the end of the session, pt reported both her R rib cage and upper shoulder felt more loose. Overall, the R upper shoulder pain is better, while the R rib cage continues to have periodds of higher level pain. Pt tolerated PT today without adverse effects. Pt will continue to benefit from skilled PT to address impairments for improved function with less pain.   OBJECTIVE IMPAIRMENTS: decreased activity tolerance, decreased ROM, decreased strength, increased muscle spasms, and pain.   ACTIVITY LIMITATIONS: carrying, lifting, dressing, reach over head, and caring for others  PARTICIPATION LIMITATIONS: meal prep, cleaning, laundry, driving, occupation, and yard work  PERSONAL FACTORS: Time since onset of injury/illness/exacerbation are also affecting patient's functional outcome.   REHAB POTENTIAL: Excellent  CLINICAL DECISION MAKING: Stable/uncomplicated  EVALUATION COMPLEXITY: Low   GOALS:  SHORT TERM GOALS: Target date: 09/26/22  Pt will be Ind in an initial HEP  Baseline: started Goal status: Ongoing  2.  Pt will voice understanding of measures to assist in pain reduction  Baseline: started Goal status: Ongoing  LONG TERM  GOALS: Target date: 11/07/22  Pt will be Ind in a final HEP to maintain achieved LOF  Baseline: started Goal status: INITIAL  2.  Increase R shoulder flexion and abd to 145d for appropriate upper body function Baseline: 100d for both Goal status: INITIAL  3.  Pt will report a decrease in pain to 0-3/10 with daily and work related activities form improved QOL and to return back to work Baseline: 3-8/10 Goal status: INITIAL  4.  Increase R shoulder strength to 4+/5 or greater for improved R UE function for return to work  Baseline: 100d for both Goal status: INITIAL  5.  Pt will be able to handle 15#, lifting from floor to waist height and from one waist height surface to another for 2 set of 10 each Baseline: not tested Goal status: INITIAL  6.  Pt will be able to handle 75#, lifting from floor to waist height and from one waist height surface to another for 2 sets of 3 each Baseline: not tested Goal status: INITIAL  7.  Pt's FOTO score will improved to the predicted value of 57% as indication of improved function  Baseline: 31% Goal status: INITIAL  PLAN:  PT FREQUENCY: 2x/week  PT DURATION: 8 weeks  PLANNED INTERVENTIONS: Therapeutic exercises, Therapeutic activity, Patient/Family education, Self Care, Joint mobilization, Dry Needling, Electrical stimulation, Cryotherapy, Moist heat, Taping, Ultrasound, Ionotophoresis 4mg /ml Dexamethasone, Manual therapy,  and Re-evaluation.  PLAN FOR NEXT SESSION: Review FOTO; assess response to HEP; progress therex as indicated; use of modalities, manual therapy; and TPDN as indicated.  Raymund Manrique MS, PT 09/18/22 5:22 PM

## 2022-09-18 ENCOUNTER — Ambulatory Visit: Payer: BC Managed Care – PPO

## 2022-09-18 DIAGNOSIS — M546 Pain in thoracic spine: Secondary | ICD-10-CM | POA: Diagnosis not present

## 2022-09-18 DIAGNOSIS — M6281 Muscle weakness (generalized): Secondary | ICD-10-CM | POA: Diagnosis not present

## 2022-09-18 DIAGNOSIS — R252 Cramp and spasm: Secondary | ICD-10-CM | POA: Diagnosis not present

## 2022-09-18 DIAGNOSIS — S39011A Strain of muscle, fascia and tendon of abdomen, initial encounter: Secondary | ICD-10-CM | POA: Diagnosis not present

## 2022-09-22 ENCOUNTER — Encounter: Payer: Self-pay | Admitting: Internal Medicine

## 2022-09-23 ENCOUNTER — Ambulatory Visit (INDEPENDENT_AMBULATORY_CARE_PROVIDER_SITE_OTHER): Payer: BC Managed Care – PPO | Admitting: Internal Medicine

## 2022-09-23 VITALS — BP 110/80 | HR 85 | Temp 98.7°F | Wt 205.9 lb

## 2022-09-23 DIAGNOSIS — S39011D Strain of muscle, fascia and tendon of abdomen, subsequent encounter: Secondary | ICD-10-CM

## 2022-09-23 DIAGNOSIS — S39011A Strain of muscle, fascia and tendon of abdomen, initial encounter: Secondary | ICD-10-CM | POA: Insufficient documentation

## 2022-09-23 DIAGNOSIS — E559 Vitamin D deficiency, unspecified: Secondary | ICD-10-CM | POA: Diagnosis not present

## 2022-09-23 LAB — VITAMIN D 25 HYDROXY (VIT D DEFICIENCY, FRACTURES): VITD: 33.9 ng/mL (ref 30.00–100.00)

## 2022-09-23 NOTE — Assessment & Plan Note (Signed)
Due for level recheck after completing 12 weeks of high-dose supplementation.

## 2022-09-23 NOTE — Assessment & Plan Note (Signed)
She continues to work with physical therapy.  Leave has been extended.  Forms filled out today.

## 2022-09-23 NOTE — Progress Notes (Signed)
Established Patient Office Visit     CC/Reason for Visit: Work forms filled out, follow-up vitamin D  HPI: Kristen [name] is a 42 y.o. female who is coming in today for the above mentioned reasons.  She has been dealing with an abdominal wall muscle strain that has been bothering her now for about a month.  Has attended about 6 sessions of PT with more planned.  Has not yet been cleared to work.  Needs to work forms extended.  She is also due to recheck vitamin D level today.   Past Medical/Surgical History: Past Medical History:  Diagnosis Date   Alpha thalassemia (HCC)    Asthma    rescue inhaler- last use May 2017   Complication of anesthesia    Endometriosis    Gestational diabetes    metformin and insulin   Preterm labor with preterm delivery    Tubular adenoma of colon     Past Surgical History:  Procedure Laterality Date   CESAREAN SECTION     CESAREAN SECTION N/A 10/29/2015   Procedure: CESAREAN SECTION;  Surgeon: Adam Phenix, MD;  Location: [facility] BIRTHING SUITES;  Service: Obstetrics;  Laterality: N/A;   DILATION AND CURETTAGE OF UTERUS      Social History:  reports that she has quit smoking. Her smoking use included cigars. She has never been exposed to tobacco smoke. She has never used smokeless tobacco. She reports that she does not drink alcohol and does not use drugs.  Allergies: Allergies  Allergen Reactions   Hydrocodone Shortness Of Breath   Morphine And Codeine Shortness Of Breath and Itching    Burning   Penicillins Hives    Has patient had a PCN reaction causing immediate rash, facial/tongue/throat swelling, SOB or lightheadedness with hypotension: Yes Has patient had a PCN reaction causing severe rash involving mucus membranes or skin necrosis: Yes Has patient had a PCN reaction that required hospitalization: No Has patient had a PCN reaction occurring within the last 10 years: Yes If all of the above answers are "NO", then may proceed  with Cephalosporin use.    Percocet [Oxycodone-Acetaminophen] Anaphylaxis    Pt has taken hydromorphone without complications in the past    Family History:  Family History  Problem Relation Age of Onset   HIV Father    Diabetes Maternal Grandmother    Diabetes Paternal Grandmother    Colon cancer Maternal Uncle    Stomach cancer Neg Hx    Rectal cancer Neg Hx    Esophageal cancer Neg Hx    Liver cancer Neg Hx      Current Outpatient Medications:    cetirizine (ZYRTEC) 10 MG tablet, Take 1 tablet (10 mg total) by mouth daily., Disp: 30 tablet, Rfl: 11   triamcinolone cream (KENALOG) 0.1 %, Apply 1 Application topically 2 (two) times daily., Disp: 30 g, Rfl: 0   albuterol (VENTOLIN HFA) 108 (90 Base) MCG/ACT inhaler, Inhale 2 puffs into the lungs every 4 (four) hours as needed for wheezing or shortness of breath., Disp: 18 g, Rfl: 1   Vitamin D, Ergocalciferol, (DRISDOL) 1.25 MG (50000 UNIT) CAPS capsule, Take 1 capsule (50,000 Units total) by mouth every 7 (seven) days for 12 doses. (Patient not taking: Reported on 09/23/2022), Disp: 12 capsule, Rfl: 0  Review of Systems:  Negative unless indicated in HPI.   Physical Exam: Vitals:   09/23/22 1106  BP: 110/80  Pulse: 85  Temp: 98.7 F (37.1 C)  TempSrc: Oral  SpO2: 98%  Weight: 205 lb 14.4 oz (93.4 kg)    Body mass index is 35.9 kg/m.   Physical Exam Vitals reviewed.  Constitutional:      Appearance: Normal appearance.  HENT:     Head: Normocephalic and atraumatic.  Eyes:     Conjunctiva/sclera: Conjunctivae normal.     Pupils: Pupils are equal, round, and reactive to light.  Skin:    General: Skin is warm and dry.  Neurological:     General: No focal deficit present.     Mental Status: She is alert and oriented to person, place, and time.  Psychiatric:        Mood and Affect: Mood normal.        Behavior: Behavior normal.        Thought Content: Thought content normal.        Judgment: Judgment normal.       Impression and Plan:  Strain of abdominal wall, subsequent encounter Assessment & Plan: She continues to work with physical therapy.  Leave has been extended.  Forms filled out today.   Vitamin D deficiency Assessment & Plan: Due for level recheck after completing 12 weeks of high-dose supplementation.  Orders: -     VITAMIN D 25 Hydroxy (Vit-D Deficiency, Fractures)     Time spent:22 minutes reviewing chart, interviewing and examining patient and formulating plan of care.     Chaya Jan, MD [facility]

## 2022-09-24 NOTE — Therapy (Signed)
OUTPATIENT PHYSICAL THERAPY THORACOLUMBAR TREATMENT NOTE   Patient Name: Kristen [name] MRN: [id number] DOB:12/14/1980, 42 y.o., female Today's Date: 09/25/2022  END OF SESSION:  PT End of Session - 09/25/22 1019     Visit Number 5    Number of Visits 17    Date for PT Re-Evaluation 11/07/22    Authorization Type BCBS COMM PPO/[place] MEDICAID HEALTHY BLUE    PT Start Time 1018    PT Stop Time 1100    PT Time Calculation (min) 42 min    Activity Tolerance Patient tolerated treatment well    Behavior During Therapy WFL for tasks assessed/performed                Past Medical History:  Diagnosis Date   Alpha thalassemia (HCC)    Asthma    rescue inhaler- last use May 2017   Complication of anesthesia    Endometriosis    Gestational diabetes    metformin and insulin   Preterm labor with preterm delivery    Tubular adenoma of colon    Past Surgical History:  Procedure Laterality Date   CESAREAN SECTION     CESAREAN SECTION N/A 10/29/2015   Procedure: CESAREAN SECTION;  Surgeon: Adam Phenix, MD;  Location: [facility] BIRTHING SUITES;  Service: Obstetrics;  Laterality: N/A;   DILATION AND CURETTAGE OF UTERUS     Patient Active Problem List   Diagnosis Date Noted   Sprain of abdominal wall 09/23/2022   Vitamin D deficiency 07/10/2022   Microcytosis 07/10/2022   Alpha thalassemia (HCC) 07/10/2022   Obesity (BMI 30.0-34.9) 07/09/2022   Hypnagogic hallucinations 10/22/2016   Gestational diabetes mellitus (GDM) affecting fifth pregnancy 10/22/2016   Unwanted fertility 09/17/2016   Bowel trouble 02/07/2016   Slow transit constipation 02/07/2016    PCP: Philip Aspen, Limmie Patricia, MD   REFERRING PROVIDER: Philip Aspen, Limmie Patricia, MD   REFERRING DIAG: [phone] (ICD-10-CM) - Strain of abdominal wall, initial encounter   Rationale for Evaluation and Treatment: Rehabilitation  THERAPY DIAG:  Pain in thoracic spine  Cramp and spasm  ONSET DATE: June 14th,  2024  SUBJECTIVE:                                                                                                                                                                                           SUBJECTIVE STATEMENT: Pt reports she has started to experience less pain. She notes the knee bolster has been very helpful with her sleeping better.  EVAL: Pt reports she strained her R ant/lat chest/abdominal area when lifting a package at [employer]. Pt notes the pain can radiate to her  R back and her R upper shoulder hurts as well. At the time of the injury, the pt states the pain was intense and she felt like she was going to throw up. Pt indicates there was an area of swelling which ran along her ant/lat chest from under her R breast to her last rib. Following the injury, she worked on [employer] duty (small sort) for 1 week. Currently she is out of work on [employer]. She can return to work on light duty when she can tolerate lifting 15 lbs for 3.5 hours. In addition to pain, pt reports muscle spasms in the area. Pt has 6 children.  Pt is Rt handed  PERTINENT HISTORY:  NA  PAIN:  Are you having pain? Yes: NPRS scale: 4/10 Pain location: R ant/lat chest/abdominal area, radiates to R back, R upper shoulder Pain description: sharp, ache , throb, burning, spasms Aggravating factors: Driving Relieving factors: Cold pack, hot bath soaks Pain range on eval: 3-8/10  PRECAUTIONS: None  WEIGHT BEARING RESTRICTIONS: No  FALLS:  Has patient fallen in last 6 months? 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 Pt's need to be able to lift 75# to return to full duty  PLOF: Independent  PATIENT GOALS: Pain relief and to return to work  OBJECTIVE:   DIAGNOSTIC FINDINGS:  NA  PATIENT SURVEYS:  FOTO: Perceived  function   31%, predicted   57%   SCREENING FOR RED FLAGS: Bowel or bladder incontinence:   COGNITION: Overall cognitive status: Within functional limits for tasks assessed     SENSATION: WFL  MUSCLE LENGTH: Hamstrings: Right NT deg; Left NT deg Maisie Fus test: Right NT deg; Left NT deg  POSTURE: rounded shoulders and forward head  PALPATION: TTP to the ant/lat R rib cage from the 5th to 12th ribs and of the R upper trap  UE MMT:  MMT Right 09/04/22 Left 09/04/22  Shoulder flexion 4p 5  Shoulder extension 4p 5  Shoulder abduction 4p 5  Shoulder adduction 4p 5  Shoulder extension 4p 5  Shoulder internal rotation 4p 5  Shoulder external rotation 4p 5  Elbow flexion 5 5  Elbow extension 5 5  Wrist flexion    Wrist extension    Wrist ulnar deviation    Wrist radial deviation    Wrist pronation    Wrist supination     (Blank rows = not tested) P=denotes concordant pain  UE ROM:  AROM Right 09/04/22 Left 09/04/22  Shoulder flexion 100 150  Shoulder extension    Shoulder abduction 100 150  Shoulder adduction    Shoulder extension    Shoulder internal rotation    Shoulder external rotation    Middle trapezius    Lower trapezius    Elbow flexion    Elbow extension    Wrist flexion    Wrist extension    Wrist ulnar deviation    Wrist radial deviation    Wrist pronation    Wrist supination    Grip strength     (Blank rows = not tested)  TODAY'S TREATMENT:  [facility] Adult PT Treatment:  DATE: 09/25/22 Therapeutic Exercise: Nustep 8 mins L5 UE/LE Supine shoulder flex/ext c dowel: Full motion x10 s wt. To 90d c 5lbs x10. To 90d for serratus c GTB shoulder abd set Seated upper trap stretch Seated levator stretch Manual Therapy: STM to the R upper trap Slilled palpation to identify TrPs and taut muscle bands  Trigger Point Dry Needling Treatment: Pre-treatment instruction: Patient instructed on dry needling rationale,  procedures, and possible side effects including pain during treatment (achy,cramping feeling), bruising, drop of blood, lightheadedness, nausea, sweating. Patient Consent Given: Yes Education handout provided: Yes Muscles treated: R upper trap  Needle size and number: .25x19mm x 1 Electrical stimulation performed: No Parameters: N/A Treatment response/outcome: Twitch response elicited Post-treatment instructions: Patient instructed to expect possible mild to moderate muscle soreness later today and/or tomorrow. Patient instructed in methods to reduce muscle soreness and to continue prescribed HEP. If patient was dry needled over the lung field, patient was instructed on signs and symptoms of pneumothorax and, however unlikely, to see immediate medical attention should they occur. Patient was also educated on signs and symptoms of infection and to seek medical attention should they occur. Patient verbalized understanding of these instructions and education.   [facility] Adult PT Treatment:                                                DATE: 09/18/22 Therapeutic Exercise: PPT x10 3 sec Marching with PPT Supine shoulder flex/ext c dowel  Rib cage breathing Seated upper trap stretch  Seated levator stretch  Manual Therapy: STM c MTPR to the R upper trap and levator    [facility] Adult PT Treatment:                                                DATE: 09/16/22 Therapeutic Exercise: Seated upper trap stretch  Seated levator stretch  Shoulder flexion walk back  Shoulder flexion wall slides with towel  Palloff press green x 10 each  Supine side lying shoulder abduction Supine side lying shoulder flexion Hook lying dowel shoulder flexion  Self Care: Sleep positioning: Supine with legs elevated on tall bolster and right shoulder supported with pillow                                                                                                                         PATIENT EDUCATION:  Education  details: Eval findings, POC, HEP, self care  Person educated: Patient Education method: Explanation, Demonstration, Tactile cues, Verbal cues, and Handouts Education comprehension: verbalized understanding, returned demonstration, verbal cues required, tactile cues required, and needs further education  HOME EXERCISE PROGRAM: Access Code: [email or web address] URL: [email or web address] Date: 09/18/2022 Prepared by: Joellyn Rued  Exercises - Standing '  L' Stretch at Counter  - 2 x daily - 7 x weekly - 1 sets - 10 reps - 5-20 hold - Shoulder Flexion Wall Slide with Towel  - 2 x daily - 7 x weekly - 1 sets - 10 reps - 5=10 hold - Supine Posterior Pelvic Tilt  - 2 x daily - 7 x weekly - 1 sets - 10 reps - 3 hold - Supine Shoulder Flexion Extension AAROM with Dowel  - 2 x daily - 7 x weekly - 1 sets - 10 reps - Standing Anti-Rotation Press with Anchored Resistance  - 2 x daily - 7 x weekly - 1 sets - 10 reps - 3 hold - Seated Upper Trapezius Stretch  - 1 x daily - 7 x weekly - 1 sets - 3 reps - 15-30 hold - Gentle Levator Scapulae Stretch  - 1 x daily - 7 x weekly - 1 sets - 3 reps - 15-30 hold - Hooklying Rib Cage Breathing  - 2 x daily - 7 x weekly - 2 sets - 5 reps - 5 hold - Supine March  - 2 x daily - 7 x weekly - 3 sets - 10 reps  ASSESSMENT:  CLINICAL IMPRESSION: Pt reports noticing some improvement with her R ant/lat chest wall pain. PT was completed for STM to the R upper shoulder region f/b TPDN to the R upper trap. Muscle twitch responses were elicited. TPDN was f/b therex for gentle core strengthening and for stretching of the R upper shoulder muscles. Pt experienced the anticipated soreness of the upper trap following TPDN, but other wise tolerated the session well. Pt will continue to benefit from skilled PT to address impairments for improved function with less pain.   OBJECTIVE IMPAIRMENTS: decreased activity tolerance, decreased ROM, decreased strength, increased muscle spasms,  and pain.   ACTIVITY LIMITATIONS: carrying, lifting, dressing, reach over head, and caring for others  PARTICIPATION LIMITATIONS: meal prep, cleaning, laundry, driving, occupation, and yard work  PERSONAL FACTORS: Time since onset of injury/illness/exacerbation are also affecting patient's functional outcome.   REHAB POTENTIAL: Excellent  CLINICAL DECISION MAKING: Stable/uncomplicated  EVALUATION COMPLEXITY: Low   GOALS:  SHORT TERM GOALS: Target date: 09/26/22  Pt will be Ind in an initial HEP  Baseline: started Goal status: Ongoing  2.  Pt will voice understanding of measures to assist in pain reduction  Baseline: started 09/25/22: use of cold pack, moist heat, knee bolster for sleep position Goal status: Ongoing  LONG TERM GOALS: Target date: 11/07/22  Pt will be Ind in a final HEP to maintain achieved LOF  Baseline: started Goal status: INITIAL  2.  Increase R shoulder flexion and abd to 145d for appropriate upper body function Baseline: 100d for both Goal status: INITIAL  3.  Pt will report a decrease in pain to 0-3/10 with daily and work related activities form improved QOL and to return back to work Baseline: 3-8/10 Goal status: INITIAL  4.  Increase R shoulder strength to 4+/5 or greater for improved R UE function for return to work  Baseline: 100d for both Goal status: INITIAL  5.  Pt will be able to handle 15#, lifting from floor to waist height and from one waist height surface to another for 2 set of 10 each Baseline: not tested Goal status: INITIAL  6.  Pt will be able to handle 75#, lifting from floor to waist height and from one waist height surface to another for 2 sets of 3 each  Baseline: not tested Goal status: INITIAL  7.  Pt's FOTO score will improved to the predicted value of 57% as indication of improved function  Baseline: 31% Goal status: INITIAL  PLAN:  PT FREQUENCY: 2x/week  PT DURATION: 8 weeks  PLANNED INTERVENTIONS: Therapeutic  exercises, Therapeutic activity, Patient/Family education, Self Care, Joint mobilization, Dry Needling, Electrical stimulation, Cryotherapy, Moist heat, Taping, Ultrasound, Ionotophoresis 4mg /ml Dexamethasone, Manual therapy, and Re-evaluation.  PLAN FOR NEXT SESSION: Review FOTO; assess response to HEP; progress therex as indicated; use of modalities, manual therapy; and TPDN as indicated.  Loveah Like MS, PT 09/25/22 12:56 PM

## 2022-09-25 ENCOUNTER — Ambulatory Visit: Payer: BC Managed Care – PPO | Attending: Internal Medicine

## 2022-09-25 DIAGNOSIS — M546 Pain in thoracic spine: Secondary | ICD-10-CM | POA: Insufficient documentation

## 2022-09-25 DIAGNOSIS — R252 Cramp and spasm: Secondary | ICD-10-CM | POA: Diagnosis not present

## 2022-09-30 ENCOUNTER — Ambulatory Visit: Payer: BC Managed Care – PPO

## 2022-09-30 NOTE — Therapy (Signed)
OUTPATIENT PHYSICAL THERAPY THORACOLUMBAR TREATMENT NOTE   Patient Name: Kristen [name] MRN: [id number] DOB:March 05, 1980, 42 y.o., female Today's Date: 10/03/2022  END OF SESSION:  PT End of Session - 10/03/22 1104     Visit Number 6    Number of Visits 17    Date for PT Re-Evaluation 11/07/22    Authorization Type BCBS COMM PPO/[place] MEDICAID HE[employer]Y BLUE    PT Start Time 1103    PT Stop Time 1144    PT Time Calculation (min) 41 min    Activity Tolerance Patient tolerated treatment well    Behavior During Therapy WFL for tasks assessed/performed                 Past Medical History:  Diagnosis Date   Alpha thalassemia (HCC)    Asthma    uses inhaler daily/as needed   Complication of anesthesia    reaction to medication in epidural -morphine   Endometriosis    Preterm labor with preterm delivery    Seasonal allergies    Tubular adenoma of colon    Past Surgical History:  Procedure Laterality Date   CESAREAN SECTION     CESAREAN SECTION N/A 10/29/2015   Procedure: CESAREAN SECTION;  Surgeon: Adam Phenix, MD;  Location: [facility];  Service: Obstetrics;  Laterality: N/A;   DILATION AND CURETTAGE OF UTERUS     WISDOM TOOTH EXTRACTION  2014   Patient Active Problem List   Diagnosis Date Noted   Sprain of abdominal wall 09/23/2022   Vitamin D deficiency 07/10/2022   Microcytosis 07/10/2022   Alpha thalassemia (HCC) 07/10/2022   Obesity (BMI 30.0-34.9) 07/09/2022   Hypnagogic hallucinations 10/22/2016   Gestational diabetes mellitus (GDM) affecting fifth pregnancy 10/22/2016   Unwanted fertility 09/17/2016   Bowel trouble 02/07/2016   Slow transit constipation 02/07/2016    PCP: Philip Aspen, Limmie Patricia, MD   REFERRING PROVIDER: Philip Aspen, Limmie Patricia, MD   REFERRING DIAG: [phone] (ICD-10-CM) - Strain of abdominal wall, initial encounter   Rationale for Evaluation and Treatment: Rehabilitation  THERAPY DIAG:  Pain in thoracic  spine  Cramp and spasm  ONSET DATE: June 14th, 2024  SUBJECTIVE:                                                                                                                                                                                           SUBJECTIVE STATEMENT: Pt reports she is experiencing more soreness than pain due to being more active. Pt feels she is improving. Pt notes the TPDN was helpful with her r upper shoulder/neck pain  EVAL: Pt reports she strained her  R ant/lat chest/abdominal area when lifting a package at [employer]. Pt notes the pain can radiate to her R back and her R upper shoulder hurts as well. At the time of the injury, the pt states the pain was intense and she felt like she was going to throw up. Pt indicates there was an area of swelling which ran along her ant/lat chest from under her R breast to her last rib. Following the injury, she worked on [employer] duty (small sort) for 1 week. Currently she is out of work on [employer]. She can return to work on light duty when she can tolerate lifting 15 lbs for 3.5 hours. In addition to pain, pt reports muscle spasms in the area. Pt has 6 children.  Pt is Rt handed  PERTINENT HISTORY:  NA  PAIN:  Are you having pain? Yes: NPRS scale: 5/10 Pain location: R ant/lat chest/abdominal area, radiates to R back, R upper shoulder Pain description: sharp, ache , throb, burning, spasms Aggravating factors: Driving Relieving factors: Cold pack, hot bath soaks Pain range on eval: 3-8/10  PRECAUTIONS: None  WEIGHT BEARING RESTRICTIONS: No  FALLS:  Has patient fallen in last 6 months? 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 Pt's need to be able to lift 75# to return to full duty  PLOF: Independent  PATIENT GOALS: Pain relief and to return  to work  OBJECTIVE:   DIAGNOSTIC FINDINGS:  NA  PATIENT SURVEYS:  FOTO: Perceived function   31%, predicted   57%   SCREENING FOR RED FLAGS: Bowel or bladder incontinence:   COGNITION: Overall cognitive status: Within functional limits for tasks assessed     SENSATION: WFL  MUSCLE LENGTH: Hamstrings: Right NT deg; Left NT deg Maisie Fus test: Right NT deg; Left NT deg  POSTURE: rounded shoulders and forward head  PALPATION: TTP to the ant/lat R rib cage from the 5th to 12th ribs and of the R upper trap  UE MMT:  MMT Right 09/04/22 Left 09/04/22  Shoulder flexion 4p 5  Shoulder extension 4p 5  Shoulder abduction 4p 5  Shoulder adduction 4p 5  Shoulder extension 4p 5  Shoulder internal rotation 4p 5  Shoulder external rotation 4p 5  Elbow flexion 5 5  Elbow extension 5 5  Wrist flexion    Wrist extension    Wrist ulnar deviation    Wrist radial deviation    Wrist pronation    Wrist supination     (Blank rows = not tested) P=denotes concordant pain  UE ROM:  AROM Right 09/04/22 Left 09/04/22  Shoulder flexion 100 150  Shoulder extension    Shoulder abduction 100 150  Shoulder adduction    Shoulder extension    Shoulder internal rotation    Shoulder external rotation    Middle trapezius    Lower trapezius    Elbow flexion    Elbow extension    Wrist flexion    Wrist extension    Wrist ulnar deviation    Wrist radial deviation    Wrist pronation    Wrist supination    Grip strength     (Blank rows = not tested)  TODAY'S TREATMENT:  [facility] Adult PT Treatment:  DATE: 10/03/22 Therapeutic Exercise: Nustep 8 mins L5 UE/LE Supine shoulder flex/ext c dowel: Full motion x10 s wt. To 90d c 5lbs x10. At 90d for serratus press 5 lbs PPS x10 3 set Abdominal set c marching 2x10 LTR c swiss ball abdominal work x10 STS x10 3lbs each hand  Shoulder ext c RTB 2x10  [facility] Adult PT Treatment:                                                 DATE: 09/25/22 Therapeutic Exercise: Nustep 8 mins L5 UE/LE Supine shoulder flex/ext c dowel: Full motion x10 s wt. To 90d c 5lbs x10. To 90d for serratus c GTB shoulder abd set Seated upper trap stretch Seated levator stretch Manual Therapy: STM to the R upper trap Slilled palpation to identify TrPs and taut muscle bands  Trigger Point Dry Needling Treatment: Pre-treatment instruction: Patient instructed on dry needling rationale, procedures, and possible side effects including pain during treatment (achy,cramping feeling), bruising, drop of blood, lightheadedness, nausea, sweating. Patient Consent Given: Yes Education handout provided: Yes Muscles treated: R upper trap  Needle size and number: .25x40mm x 1 Electrical stimulation performed: No Parameters: N/A Treatment response/outcome: Twitch response elicited Post-treatment instructions: Patient instructed to expect possible mild to moderate muscle soreness later today and/or tomorrow. Patient instructed in methods to reduce muscle soreness and to continue prescribed HEP. If patient was dry needled over the lung field, patient was instructed on signs and symptoms of pneumothorax and, however unlikely, to see immediate medical attention should they occur. Patient was also educated on signs and symptoms of infection and to seek medical attention should they occur. Patient verbalized understanding of these instructions and education.   [facility] Adult PT Treatment:                                                DATE: 09/18/22 Therapeutic Exercise: PPT x10 3 sec Marching with PPT Supine shoulder flex/ext c dowel  Rib cage breathing Seated upper trap stretch  Seated levator stretch  Manual Therapy: STM c MTPR to the R upper trap and levator                                                                                                                           PATIENT EDUCATION:  Education details: Eval findings, POC, HEP,  self care  Person educated: Patient Education method: Explanation, Demonstration, Tactile cues, Verbal cues, and Handouts Education comprehension: verbalized understanding, returned demonstration, verbal cues required, tactile cues required, and needs further education  HOME EXERCISE PROGRAM: Access Code: [email or web address] URL: [email or web address] Date: 09/18/2022 Prepared by: Joellyn Rued  Exercises - Standing 'L' Stretch at Counter  - 2  x daily - 7 x weekly - 1 sets - 10 reps - 5-20 hold - Shoulder Flexion Wall Slide with Towel  - 2 x daily - 7 x weekly - 1 sets - 10 reps - 5=10 hold - Supine Posterior Pelvic Tilt  - 2 x daily - 7 x weekly - 1 sets - 10 reps - 3 hold - Supine Shoulder Flexion Extension AAROM with Dowel  - 2 x daily - 7 x weekly - 1 sets - 10 reps - Standing Anti-Rotation Press with Anchored Resistance  - 2 x daily - 7 x weekly - 1 sets - 10 reps - 3 hold - Seated Upper Trapezius Stretch  - 1 x daily - 7 x weekly - 1 sets - 3 reps - 15-30 hold - Gentle Levator Scapulae Stretch  - 1 x daily - 7 x weekly - 1 sets - 3 reps - 15-30 hold - Hooklying Rib Cage Breathing  - 2 x daily - 7 x weekly - 2 sets - 5 reps - 5 hold - Supine March  - 2 x daily - 7 x weekly - 3 sets - 10 reps  ASSESSMENT:  CLINICAL IMPRESSION: PT was completed for core and shoulder strengthening. Demand for the therex as increased and pt tolerated the prescribed exs without adverse effects. Due to the the degree of injury the pt sustained, anticipate slower progress. Pt will continue to benefit from skilled PT to address impairments for improved function with less pain.   OBJECTIVE IMPAIRMENTS: decreased activity tolerance, decreased ROM, decreased strength, increased muscle spasms, and pain.   ACTIVITY LIMITATIONS: carrying, lifting, dressing, reach over head, and caring for others  PARTICIPATION LIMITATIONS: meal prep, cleaning, laundry, driving, occupation, and yard work  PERSONAL FACTORS:  Time since onset of injury/illness/exacerbation are also affecting patient's functional outcome.   REHAB POTENTIAL: Excellent  CLINICAL DECISION MAKING: Stable/uncomplicated  EVALUATION COMPLEXITY: Low   GOALS:  SHORT TERM GOALS: Target date: 09/26/22  Pt will be Ind in an initial HEP  Baseline: started Goal status: MET 2.  Pt will voice understanding of measures to assist in pain reduction  Baseline: started 09/25/22: use of cold pack, moist heat, knee bolster for sleep position Goal status: MET   LONG TERM GOALS: Target date: 11/07/22  Pt will be Ind in a final HEP to maintain achieved LOF  Baseline: started Goal status: INITIAL  2.  Increase R shoulder flexion and abd to 145d for appropriate upper body function Baseline: 100d for both Goal status: INITIAL  3.  Pt will report a decrease in pain to 0-3/10 with daily and work related activities form improved QOL and to return back to work Baseline: 3-8/10 Goal status: INITIAL  4.  Increase R shoulder strength to 4+/5 or greater for improved R UE function for return to work  Baseline: 100d for both Goal status: INITIAL  5.  Pt will be able to handle 15#, lifting from floor to waist height and from one waist height surface to another for 2 set of 10 each Baseline: not tested Goal status: INITIAL  6.  Pt will be able to handle 75#, lifting from floor to waist height and from one waist height surface to another for 2 sets of 3 each Baseline: not tested Goal status: INITIAL  7.  Pt's FOTO score will improved to the predicted value of 57% as indication of improved function  Baseline: 31% Goal status: INITIAL  PLAN:  PT FREQUENCY: 2x/week  PT DURATION: 8  weeks  PLANNED INTERVENTIONS: Therapeutic exercises, Therapeutic activity, Patient/Family education, Self Care, Joint mobilization, Dry Needling, Electrical stimulation, Cryotherapy, Moist heat, Taping, Ultrasound, Ionotophoresis 4mg /ml Dexamethasone, Manual therapy, and  Re-evaluation.  PLAN FOR NEXT SESSION: Review FOTO; assess response to HEP; progress therex as indicated; use of modalities, manual therapy; and TPDN as indicated.  Woodie Degraffenreid MS, PT 10/03/22 2:48 PM

## 2022-10-02 ENCOUNTER — Ambulatory Visit (AMBULATORY_SURGERY_CENTER): Payer: BC Managed Care – PPO

## 2022-10-02 VITALS — Ht 63.5 in | Wt 211.0 lb

## 2022-10-02 DIAGNOSIS — Z86010 Personal history of colonic polyps: Secondary | ICD-10-CM

## 2022-10-02 MED ORDER — NA SULFATE-K SULFATE-MG SULF 17.5-3.13-1.6 GM/177ML PO SOLN: 1.0000 | Freq: Once | ORAL | 0 refills | Status: AC

## 2022-10-02 NOTE — Progress Notes (Signed)
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- however, she is able to eat eggs that have been cooked in a cake or pie without any issues;  No soy allergy known to patient  No issues known to pt with past sedation with any surgeries or procedures; Patient denies ever being told they had issues or difficulty with intubation;  No FH of Malignant Hyperthermia; Pt is not on diet pills; Pt is not on home 02;  Pt is not on blood thinners;  Pt reports issues with constipation - patient reports she takes a stool softeners, Mag Citrate, Dulcolax, Miralax; patient reports it can vary on how many times per week and diet intake; patient advised to increase oral fluid intake and fruits/veggies that are allowed, along with increases exercise/activity;  No A fib or A flutter;  Have any cardiac testing pending--NO Insurance verified during PV appt--- BCBS   Pt can ambulate without assistance;  Pt denies use of chewing tobacco Discussed diabetic/weight loss medication holds; Discussed NSAID holds; Checked BMI to be less than 50; Pt instructed to use Singlecare.com or GoodRx for a price reduction on prep  Patient's chart reviewed by Cathlyn Parsons CNRA prior to previsit and patient appropriate for the [facility].  Pre visit completed and red dot placed by patient's name on their procedure day (on provider's schedule).

## 2022-10-03 ENCOUNTER — Ambulatory Visit: Payer: BC Managed Care – PPO

## 2022-10-03 DIAGNOSIS — R252 Cramp and spasm: Secondary | ICD-10-CM | POA: Diagnosis not present

## 2022-10-03 DIAGNOSIS — M546 Pain in thoracic spine: Secondary | ICD-10-CM

## 2022-10-07 ENCOUNTER — Ambulatory Visit: Payer: BC Managed Care – PPO

## 2022-10-07 DIAGNOSIS — R252 Cramp and spasm: Secondary | ICD-10-CM

## 2022-10-07 DIAGNOSIS — M546 Pain in thoracic spine: Secondary | ICD-10-CM | POA: Diagnosis not present

## 2022-10-07 NOTE — Therapy (Signed)
OUTPATIENT PHYSICAL THERAPY THORACOLUMBAR TREATMENT NOTE   Patient Name: Kristen [name] MRN: [id number] DOB:[birth year], 42 y.o., female Today's Date: 10/07/2022  END OF SESSION:  PT End of Session - 10/07/22 1108     Visit Number 7    Number of Visits 17    Date for PT Re-Evaluation 11/07/22    Authorization Type BCBS COMM PPO/[place] MEDICAID HEALTHY BLUE    PT Start Time 1102    PT Stop Time 1145    PT Time Calculation (min) 43 min    Activity Tolerance Patient tolerated treatment well    Behavior During Therapy WFL for tasks assessed/performed                  Past Medical History:  Diagnosis Date   Alpha thalassemia (HCC)    Asthma    uses inhaler daily/as needed   Complication of anesthesia    reaction to medication in epidural -morphine   Endometriosis    Preterm labor with preterm delivery    Seasonal allergies    Tubular adenoma of colon    Past Surgical History:  Procedure Laterality Date   CESAREAN SECTION     CESAREAN SECTION N/A 10/29/2015   Procedure: CESAREAN SECTION;  Surgeon: Adam Phenix, MD;  Location: [facility] BIRTHING SUITES;  Service: Obstetrics;  Laterality: N/A;   DILATION AND CURETTAGE OF UTERUS     WISDOM TOOTH EXTRACTION  2014   Patient Active Problem List   Diagnosis Date Noted   Sprain of abdominal wall 09/23/2022   Vitamin D deficiency 07/10/2022   Microcytosis 07/10/2022   Alpha thalassemia (HCC) 07/10/2022   Obesity (BMI 30.0-34.9) 07/09/2022   Hypnagogic hallucinations 10/22/2016   Gestational diabetes mellitus (GDM) affecting fifth pregnancy 10/22/2016   Unwanted fertility 09/17/2016   Bowel trouble 02/07/2016   Slow transit constipation 02/07/2016    PCP: Philip Aspen, Limmie Patricia, MD   REFERRING PROVIDER: Philip Aspen, Limmie Patricia, MD   REFERRING DIAG: [phone] (ICD-10-CM) - Strain of abdominal wall, initial encounter   Rationale for Evaluation and Treatment: Rehabilitation  THERAPY DIAG:  Pain in thoracic  spine  Cramp and spasm  ONSET DATE: June 14th, 2024  SUBJECTIVE:                                                                                                                                                                                           SUBJECTIVE STATEMENT: Pt reports she has been driving more due to her children's school schedule which has aggravated her R shoulder and R lateral side more. This has affected her sleep and causes her to worry the healing process  and time frame.    EVAL: Pt reports she strained her R ant/lat chest/abdominal area when lifting a package at [employer]. Pt notes the pain can radiate to her R back and her R upper shoulder hurts as well. At the time of the injury, the pt states the pain was intense and she felt like she was going to throw up. Pt indicates there was an area of swelling which ran along her ant/lat chest from under her R breast to her last rib. Following the injury, she worked on [employer] duty (small sort) for 1 week. Currently she is out of work on [employer]. She can return to work on light duty when she can tolerate lifting 15 lbs for 3.5 hours. In addition to pain, pt reports muscle spasms in the area. Pt has 6 children.  Pt is Rt handed  PERTINENT HISTORY:  NA  PAIN:  Are you having pain? Yes: NPRS scale: 7/10 Pain location: R ant/lat chest/abdominal area, radiates to R back, R upper shoulder Pain description: sharp, ache , throb, burning, spasms Aggravating factors: Driving Relieving factors: Cold pack, hot bath soaks Pain range on eval: 3-8/10  PRECAUTIONS: None  WEIGHT BEARING RESTRICTIONS: No  FALLS:  Has patient fallen in last 6 months? 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 Pt's need to be able to lift 75# to return to full duty  PLOF:  Independent  PATIENT GOALS: Pain relief and to return to work  OBJECTIVE:   DIAGNOSTIC FINDINGS:  NA  PATIENT SURVEYS:  FOTO: Perceived function   31%, predicted   57%   SCREENING FOR RED FLAGS: Bowel or bladder incontinence:   COGNITION: Overall cognitive status: Within functional limits for tasks assessed     SENSATION: WFL  MUSCLE LENGTH: Hamstrings: Right NT deg; Left NT deg Maisie Fus test: Right NT deg; Left NT deg  POSTURE: rounded shoulders and forward head  PALPATION: TTP to the ant/lat R rib cage from the 5th to 12th ribs and of the R upper trap  UE MMT:  MMT Right 09/04/22 Left 09/04/22  Shoulder flexion 4p 5  Shoulder extension 4p 5  Shoulder abduction 4p 5  Shoulder adduction 4p 5  Shoulder extension 4p 5  Shoulder internal rotation 4p 5  Shoulder external rotation 4p 5  Elbow flexion 5 5  Elbow extension 5 5  Wrist flexion    Wrist extension    Wrist ulnar deviation    Wrist radial deviation    Wrist pronation    Wrist supination     (Blank rows = not tested) P=denotes concordant pain  UE ROM:  AROM Right 09/04/22 Left 09/04/22 RT 10/07/22  Shoulder flexion 100 150 130  Shoulder extension     Shoulder abduction 100 150 120  Shoulder adduction     Shoulder extension     Shoulder internal rotation     Shoulder external rotation     Middle trapezius     Lower trapezius     Elbow flexion     Elbow extension     Wrist flexion     Wrist extension     Wrist ulnar deviation     Wrist radial deviation     Wrist pronation     Wrist supination     Grip strength      (Blank rows = not  tested)  TODAY'S TREATMENT:  [facility] Adult PT Treatment:                                                DATE: 10/07/22 Therapeutic Exercise: Nustep 8 mins L5 UE/LE LTR x10 5" Open books x10" Supine shoulder flex/ext c dowel: 90d c 3lbs x10. At 90d for serratus press 5 lbs Abdominal set c marching x10 Cat/camel x10 Child pose stretch forward and lateral  [facility]  Adult PT Treatment:                                                DATE: 10/03/22 Therapeutic Exercise: Nustep 8 mins L5 UE/LE Supine shoulder flex/ext c dowel: Full motion x10 s wt. To 90d c 5lbs x10. At 90d for serratus press 5 lbs PPS x10 3 set Abdominal set c marching 2x10 LTR c swiss ball abdominal work x10 STS x10 3lbs each hand  Shoulder ext c RTB 2x10  [facility] Adult PT Treatment:                                                DATE: 09/25/22 Therapeutic Exercise: Nustep 8 mins L5 UE/LE Supine shoulder flex/ext c dowel: Full motion x10 s wt. To 90d c 5lbs x10. To 90d for serratus c GTB shoulder abd set Seated upper trap stretch Seated levator stretch Manual Therapy: STM to the R upper trap Slilled palpation to identify TrPs and taut muscle bands  Trigger Point Dry Needling Treatment: Pre-treatment instruction: Patient instructed on dry needling rationale, procedures, and possible side effects including pain during treatment (achy,cramping feeling), bruising, drop of blood, lightheadedness, nausea, sweating. Patient Consent Given: Yes Education handout provided: Yes Muscles treated: R upper trap  Needle size and number: .25x4mm x 1 Electrical stimulation performed: No Parameters: N/A Treatment response/outcome: Twitch response elicited Post-treatment instructions: Patient instructed to expect possible mild to moderate muscle soreness later today and/or tomorrow. Patient instructed in methods to reduce muscle soreness and to continue prescribed HEP. If patient was dry needled over the lung field, patient was instructed on signs and symptoms of pneumothorax and, however unlikely, to see immediate medical attention should they occur. Patient was also educated on signs and symptoms of infection and to seek medical attention should they occur. Patient verbalized understanding of these instructions and education.   [facility] Adult PT Treatment:                                                DATE:  09/18/22 Therapeutic Exercise: PPT x10 3 sec Marching with PPT Supine shoulder flex/ext c dowel  Rib cage breathing Seated upper trap stretch  Seated levator stretch  Manual Therapy: STM c MTPR to the R upper trap and levator  PATIENT EDUCATION:  Education details: Eval findings, POC, HEP, self care  Person educated: Patient Education method: Explanation, Demonstration, Tactile cues, Verbal cues, and Handouts Education comprehension: verbalized understanding, returned demonstration, verbal cues required, tactile cues required, and needs further education  HOME EXERCISE PROGRAM: Access Code: [email or web address] URL: [email or web address] Date: 09/18/2022 Prepared by: Joellyn Rued  Exercises - Standing 'L' Stretch at Counter  - 2 x daily - 7 x weekly - 1 sets - 10 reps - 5-20 hold - Shoulder Flexion Wall Slide with Towel  - 2 x daily - 7 x weekly - 1 sets - 10 reps - 5=10 hold - Supine Posterior Pelvic Tilt  - 2 x daily - 7 x weekly - 1 sets - 10 reps - 3 hold - Supine Shoulder Flexion Extension AAROM with Dowel  - 2 x daily - 7 x weekly - 1 sets - 10 reps - Standing Anti-Rotation Press with Anchored Resistance  - 2 x daily - 7 x weekly - 1 sets - 10 reps - 3 hold - Seated Upper Trapezius Stretch  - 1 x daily - 7 x weekly - 1 sets - 3 reps - 15-30 hold - Gentle Levator Scapulae Stretch  - 1 x daily - 7 x weekly - 1 sets - 3 reps - 15-30 hold - Hooklying Rib Cage Breathing  - 2 x daily - 7 x weekly - 2 sets - 5 reps - 5 hold - Supine March  - 2 x daily - 7 x weekly - 3 sets - 10 reps  ASSESSMENT:  CLINICAL IMPRESSION: PT was completed for trunk and shoulder strengthening and ROM. Pt's overall tolerance, pain and swelling, to activity is improved, but it continues to be limited. Therex were completed at a demand level to gradually progress flexibility and strength. Pt  tolerated the prescribed therex without adverse effects.   OBJECTIVE IMPAIRMENTS: decreased activity tolerance, decreased ROM, decreased strength, increased muscle spasms, and pain.   ACTIVITY LIMITATIONS: carrying, lifting, dressing, reach over head, and caring for others  PARTICIPATION LIMITATIONS: meal prep, cleaning, laundry, driving, occupation, and yard work  PERSONAL FACTORS: Time since onset of injury/illness/exacerbation are also affecting patient's functional outcome.   REHAB POTENTIAL: Excellent  CLINICAL DECISION MAKING: Stable/uncomplicated  EVALUATION COMPLEXITY: Low   GOALS:  SHORT TERM GOALS: Target date: 09/26/22  Pt will be Ind in an initial HEP  Baseline: started Goal status: MET 2.  Pt will voice understanding of measures to assist in pain reduction  Baseline: started 09/25/22: use of cold pack, moist heat, knee bolster for sleep position Goal status: MET   LONG TERM GOALS: Target date: 11/07/22  Pt will be Ind in a final HEP to maintain achieved LOF  Baseline: started Goal status: INITIAL  2.  Increase R shoulder flexion and abd to 145d for appropriate upper body function Baseline: 100d for both Goal status: INITIAL  3.  Pt will report a decrease in pain to 0-3/10 with daily and work related activities form improved QOL and to return back to work Baseline: 3-8/10 Goal status: INITIAL  4.  Increase R shoulder strength to 4+/5 or greater for improved R UE function for return to work  Baseline: 100d for both Goal status: INITIAL  5.  Pt will be able to handle 15#, lifting from floor to waist height and from one waist height surface to another for 2 set of 10 each Baseline: not tested Goal status: INITIAL  6.  Pt will be able to handle 75#,  lifting from floor to waist height and from one waist height surface to another for 2 sets of 3 each Baseline: not tested Goal status: INITIAL  7.  Pt's FOTO score will improved to the predicted value of 57% as  indication of improved function  Baseline: 31% Goal status: INITIAL  PLAN:  PT FREQUENCY: 2x/week  PT DURATION: 8 weeks  PLANNED INTERVENTIONS: Therapeutic exercises, Therapeutic activity, Patient/Family education, Self Care, Joint mobilization, Dry Needling, Electrical stimulation, Cryotherapy, Moist heat, Taping, Ultrasound, Ionotophoresis 4mg /ml Dexamethasone, Manual therapy, and Re-evaluation.  PLAN FOR NEXT SESSION: Review FOTO; assess response to HEP; progress therex as indicated; use of modalities, manual therapy; and TPDN as indicated.  Jarvis Knodel MS, PT 10/07/22 1:28 PM

## 2022-10-09 ENCOUNTER — Ambulatory Visit: Payer: BC Managed Care – PPO | Admitting: Physical Therapy

## 2022-10-09 ENCOUNTER — Encounter: Payer: Self-pay | Admitting: Physical Therapy

## 2022-10-09 DIAGNOSIS — M546 Pain in thoracic spine: Secondary | ICD-10-CM | POA: Diagnosis not present

## 2022-10-09 DIAGNOSIS — R252 Cramp and spasm: Secondary | ICD-10-CM

## 2022-10-09 NOTE — Therapy (Signed)
OUTPATIENT PHYSICAL THERAPY THORACOLUMBAR TREATMENT NOTE   Patient Name: Kristen [name] MRN: [id number] DOB:[birth year], 42 y.o., female Today's Date: 10/09/2022  END OF SESSION:  PT End of Session - 10/09/22 1019     Visit Number 8    Number of Visits 17    Date for PT Re-Evaluation 11/07/22    Authorization Type BCBS COMM PPO/[place] MEDICAID HEALTHY BLUE    PT Start Time 1018    PT Stop Time 1100    PT Time Calculation (min) 42 min                  Past Medical History:  Diagnosis Date   Alpha thalassemia (HCC)    Asthma    uses inhaler daily/as needed   Complication of anesthesia    reaction to medication in epidural -morphine   Endometriosis    Preterm labor with preterm delivery    Seasonal allergies    Tubular adenoma of colon    Past Surgical History:  Procedure Laterality Date   CESAREAN SECTION     CESAREAN SECTION N/A 10/29/2015   Procedure: CESAREAN SECTION;  Surgeon: Adam Phenix, MD;  Location: [facility] BIRTHING SUITES;  Service: Obstetrics;  Laterality: N/A;   DILATION AND CURETTAGE OF UTERUS     WISDOM TOOTH EXTRACTION  2014   Patient Active Problem List   Diagnosis Date Noted   Sprain of abdominal wall 09/23/2022   Vitamin D deficiency 07/10/2022   Microcytosis 07/10/2022   Alpha thalassemia (HCC) 07/10/2022   Obesity (BMI 30.0-34.9) 07/09/2022   Hypnagogic hallucinations 10/22/2016   Gestational diabetes mellitus (GDM) affecting fifth pregnancy 10/22/2016   Unwanted fertility 09/17/2016   Bowel trouble 02/07/2016   Slow transit constipation 02/07/2016    PCP: Philip Aspen, Limmie Patricia, MD   REFERRING PROVIDER: Philip Aspen, Limmie Patricia, MD   REFERRING DIAG: [phone] (ICD-10-CM) - Strain of abdominal wall, initial encounter   Rationale for Evaluation and Treatment: Rehabilitation  THERAPY DIAG:  Pain in thoracic spine  Cramp and spasm  ONSET DATE: June 14th, 2024  SUBJECTIVE:                                                                                                                                                                                            SUBJECTIVE STATEMENT: I'm still really sore but I have less stiffness. 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 Right side 6/10, neck and shoulder 4/10. I walked a mile yesterday but it took 1.5 hours at [employer] [employer], it had inclines. We sat a lot.    EVAL: Pt reports she  strained her R ant/lat chest/abdominal area when lifting a package at [employer]. Pt notes the pain can radiate to her R back and her R upper shoulder hurts as well. At the time of the injury, the pt states the pain was intense and she felt like she was going to throw up. Pt indicates there was an area of swelling which ran along her ant/lat chest from under her R breast to her last rib. Following the injury, she worked on [employer] duty (small sort) for 1 week. Currently she is out of work on [employer]. She can return to work on light duty when she can tolerate lifting 15 lbs for 3.5 hours. In addition to pain, pt reports muscle spasms in the area. Pt has 6 children.  Pt is Rt handed  PERTINENT HISTORY:  NA  PAIN:  Are you having pain? Yes: NPRS scale: 6/10 Pain location: R ant/lat chest/abdominal area, radiates to R back, R upper shoulder Pain description: sharp, ache , throb, burning, spasms Aggravating factors: Driving Relieving factors: Cold pack, hot bath soaks Pain range on eval: 3-8/10  PRECAUTIONS: None  WEIGHT BEARING RESTRICTIONS: No  FALLS:  Has patient fallen in last 6 months? 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 Pt's need to be able to lift 75# to return to full duty  PLOF: Independent  PATIENT GOALS: Pain relief and to return to work  OBJECTIVE:   DIAGNOSTIC  FINDINGS:  NA  PATIENT SURVEYS:  FOTO: Perceived function   31%, predicted   57%  FOTO 38 10/09/22  SCREENING FOR RED FLAGS: Bowel or bladder incontinence:   COGNITION: Overall cognitive status: Within functional limits for tasks assessed     SENSATION: WFL  MUSCLE LENGTH: Hamstrings: Right NT deg; Left NT deg Maisie Fus test: Right NT deg; Left NT deg  POSTURE: rounded shoulders and forward head  PALPATION: TTP to the ant/lat R rib cage from the 5th to 12th ribs and of the R upper trap  UE MMT:  MMT Right 09/04/22 Left 09/04/22  Shoulder flexion 4p 5  Shoulder extension 4p 5  Shoulder abduction 4p 5  Shoulder adduction 4p 5  Shoulder extension 4p 5  Shoulder internal rotation 4p 5  Shoulder external rotation 4p 5  Elbow flexion 5 5  Elbow extension 5 5  Wrist flexion    Wrist extension    Wrist ulnar deviation    Wrist radial deviation    Wrist pronation    Wrist supination     (Blank rows = not tested) P=denotes concordant pain  UE ROM:  AROM Right 09/04/22 Left 09/04/22 RT 10/07/22  Shoulder flexion 100 150 130  Shoulder extension     Shoulder abduction 100 150 120  Shoulder adduction     Shoulder extension     Shoulder internal rotation     Shoulder external rotation     Middle trapezius     Lower trapezius     Elbow flexion     Elbow extension     Wrist flexion     Wrist extension     Wrist ulnar deviation     Wrist radial deviation     Wrist pronation     Wrist supination     Grip strength      (Blank rows = not tested)  TODAY'S TREATMENT:  [facility] Adult  PT Treatment:                                                DATE: 10/09/22 Therapeutic Exercise: Seated row 10# x 8 Chest press 5# x8  Leg press 20# x 10 Supine march while holding 3 lbs chest height  Supine dowel pullover 3# x 8  Hooklying isometric hip flexion using UE - 5 sec x 3 each - 2 sets  Hooklying isometric hip flexion using isometric UE - 5 sec x 3 each- 2 sets S/L shoulder  abduction  ER AROM S/L shoulder ER AROM  Therapeutic Activity: FOTO 38 (improved from 31)   [facility] Adult PT Treatment:                                                DATE: 10/07/22 Therapeutic Exercise: Nustep 8 mins L5 UE/LE LTR x10 5" Open books x10" Supine shoulder flex/ext c dowel: 90d c 3lbs x10. At 90d for serratus press 5 lbs Abdominal set c marching x10 Cat/camel x10 Child pose stretch forward and lateral  [facility] Adult PT Treatment:                                                DATE: 10/03/22 Therapeutic Exercise: Nustep 8 mins L5 UE/LE Supine shoulder flex/ext c dowel: Full motion x10 s wt. To 90d c 5lbs x10. At 90d for serratus press 5 lbs PPS x10 3 set Abdominal set c marching 2x10 LTR c swiss ball abdominal work x10 STS x10 3lbs each hand  Shoulder ext c RTB 2x10  [facility] Adult PT Treatment:                                                DATE: 09/25/22 Therapeutic Exercise: Nustep 8 mins L5 UE/LE Supine shoulder flex/ext c dowel: Full motion x10 s wt. To 90d c 5lbs x10. To 90d for serratus c GTB shoulder abd set Seated upper trap stretch Seated levator stretch Manual Therapy: STM to the R upper trap Slilled palpation to identify TrPs and taut muscle bands  Trigger Point Dry Needling Treatment: Pre-treatment instruction: Patient instructed on dry needling rationale, procedures, and possible side effects including pain during treatment (achy,cramping feeling), bruising, drop of blood, lightheadedness, nausea, sweating. Patient Consent Given: Yes Education handout provided: Yes Muscles treated: R upper trap  Needle size and number: .25x56mm x 1 Electrical stimulation performed: No Parameters: N/A Treatment response/outcome: Twitch response elicited Post-treatment instructions: Patient instructed to expect possible mild to moderate muscle soreness later today and/or tomorrow. Patient instructed in methods to reduce muscle soreness and to continue prescribed HEP. If patient  was dry needled over the lung field, patient was instructed on signs and symptoms of pneumothorax and, however unlikely, to see immediate medical attention should they occur. Patient was also educated on signs and symptoms of infection and to seek medical attention should they occur. Patient verbalized understanding of these instructions and education.  PATIENT EDUCATION:  Education details: Eval findings, POC, HEP, self care  Person educated: Patient Education method: Explanation, Demonstration, Tactile cues, Verbal cues, and Handouts Education comprehension: verbalized understanding, returned demonstration, verbal cues required, tactile cues required, and needs further education  HOME EXERCISE PROGRAM: Access Code: [email or web address] URL: [email or web address] Date: 09/18/2022 Prepared by: Joellyn Rued  Exercises - Standing 'L' Stretch at Counter  - 2 x daily - 7 x weekly - 1 sets - 10 reps - 5-20 hold - Shoulder Flexion Wall Slide with Towel  - 2 x daily - 7 x weekly - 1 sets - 10 reps - 5=10 hold - Supine Posterior Pelvic Tilt  - 2 x daily - 7 x weekly - 1 sets - 10 reps - 3 hold - Supine Shoulder Flexion Extension AAROM with Dowel  - 2 x daily - 7 x weekly - 1 sets - 10 reps - Standing Anti-Rotation Press with Anchored Resistance  - 2 x daily - 7 x weekly - 1 sets - 10 reps - 3 hold - Seated Upper Trapezius Stretch  - 1 x daily - 7 x weekly - 1 sets - 3 reps - 15-30 hold - Gentle Levator Scapulae Stretch  - 1 x daily - 7 x weekly - 1 sets - 3 reps - 15-30 hold - Hooklying Rib Cage Breathing  - 2 x daily - 7 x weekly - 2 sets - 5 reps - 5 hold - Supine March  - 2 x daily - 7 x weekly - 3 sets - 10 reps - Hooklying Isometric Hip Flexion  - 1 x daily - 7 x weekly - 2 sets - 5 reps - 5 hold - Hooklying Isometric Hip Flexion with Opposite Arm  - 1 x daily - 7 x weekly - 2  sets - 5 reps - 5 hold  ASSESSMENT:  CLINICAL IMPRESSION: PT was completed for trunk and shoulder strengthening and ROM. Pt's overall tolerance, pain and swelling, to activity is improved, but it continues to be limited. Therex were completed at a demand level to gradually progress flexibility and strength. Pt asked about gym machines and was introduced to light weight push/pull and leg press which were tolerated well. Began hooklying isometric hip flexion which pt tolerated well. These were added to HEP as well as oblique press. She was sore end of  session.  FOTO score improving. Pt tolerated the prescribed therex without adverse effects.   OBJECTIVE IMPAIRMENTS: decreased activity tolerance, decreased ROM, decreased strength, increased muscle spasms, and pain.   ACTIVITY LIMITATIONS: carrying, lifting, dressing, reach over head, and caring for others  PARTICIPATION LIMITATIONS: meal prep, cleaning, laundry, driving, occupation, and yard work  PERSONAL FACTORS: Time since onset of injury/illness/exacerbation are also affecting patient's functional outcome.   REHAB POTENTIAL: Excellent  CLINICAL DECISION MAKING: Stable/uncomplicated  EVALUATION COMPLEXITY: Low   GOALS:  SHORT TERM GOALS: Target date: 09/26/22  Pt will be Ind in an initial HEP  Baseline: started Goal status: MET 2.  Pt will voice understanding of measures to assist in pain reduction  Baseline: started 09/25/22: use of cold pack, moist heat, knee bolster for sleep position Goal status: MET   LONG TERM GOALS: Target date: 11/07/22  Pt will be Ind in a final HEP to maintain achieved LOF  Baseline: started Goal status: INITIAL  2.  Increase R shoulder flexion and abd to 145d for appropriate upper body function Baseline: 100d for both Goal status: INITIAL  3.  Pt will report a decrease  in pain to 0-3/10 with daily and work related activities form improved QOL and to return back to work Baseline: 3-8/10 Goal  status: INITIAL  4.  Increase R shoulder strength to 4+/5 or greater for improved R UE function for return to work  Baseline: 100d for both Goal status: INITIAL  5.  Pt will be able to handle 15#, lifting from floor to waist height and from one waist height surface to another for 2 set of 10 each Baseline: not tested Goal status: INITIAL  6.  Pt will be able to handle 75#, lifting from floor to waist height and from one waist height surface to another for 2 sets of 3 each Baseline: not tested Goal status: INITIAL  7.  Pt's FOTO score will improved to the predicted value of 57% as indication of improved function  Baseline: 31% 10/09/22: 38%  Goal status: INITIAL  PLAN:  PT FREQUENCY: 2x/week  PT DURATION: 8 weeks  PLANNED INTERVENTIONS: Therapeutic exercises, Therapeutic activity, Patient/Family education, Self Care, Joint mobilization, Dry Needling, Electrical stimulation, Cryotherapy, Moist heat, Taping, Ultrasound, Ionotophoresis 4mg /ml Dexamethasone, Manual therapy, and Re-evaluation.  PLAN FOR NEXT SESSION: Review FOTO; assess response to HEP; progress therex as indicated; use of modalities, manual therapy; and TPDN as indicated.  Jannette Spanner, PTA 10/09/22 11:14 AM Phone: [phone] Fax: [phone]

## 2022-10-13 NOTE — Therapy (Signed)
OUTPATIENT PHYSICAL THERAPY THORACOLUMBAR TREATMENT NOTE/Re-Auth   Patient Name: Kristen [name] MRN: [id number] DOB:Oct 03, 1980, 42 y.o., female Today's Date: 10/14/2022  END OF SESSION:  PT End of Session - 10/14/22 1112     Visit Number 9    Number of Visits 17    Date for PT Re-Evaluation 11/07/22    Authorization Type BCBS COMM PPO/[place] MEDICAID HEALTHY BLUE; [employer] Medicaid   Approved 9 visits 09/16/22-11/14/22    PT Start Time 1103    PT Stop Time 1145    PT Time Calculation (min) 42 min    Activity Tolerance Patient tolerated treatment well    Behavior During Therapy WFL for tasks assessed/performed                   Past Medical History:  Diagnosis Date   Alpha thalassemia (HCC)    Asthma    uses inhaler daily/as needed   Complication of anesthesia    reaction to medication in epidural -morphine   Endometriosis    Preterm labor with preterm delivery    Seasonal allergies    Tubular adenoma of colon    Past Surgical History:  Procedure Laterality Date   CESAREAN SECTION     CESAREAN SECTION N/A 10/29/2015   Procedure: CESAREAN SECTION;  Surgeon: Adam Phenix, MD;  Location: [facility] BIRTHING SUITES;  Service: Obstetrics;  Laterality: N/A;   DILATION AND CURETTAGE OF UTERUS     WISDOM TOOTH EXTRACTION  2014   Patient Active Problem List   Diagnosis Date Noted   Sprain of abdominal wall 09/23/2022   Vitamin D deficiency 07/10/2022   Microcytosis 07/10/2022   Alpha thalassemia (HCC) 07/10/2022   Obesity (BMI 30.0-34.9) 07/09/2022   Hypnagogic hallucinations 10/22/2016   Gestational diabetes mellitus (GDM) affecting fifth pregnancy 10/22/2016   Unwanted fertility 09/17/2016   Bowel trouble 02/07/2016   Slow transit constipation 02/07/2016    PCP: Philip Aspen, Limmie Patricia, MD   REFERRING PROVIDER: Philip Aspen, Limmie Patricia, MD   REFERRING DIAG: [id number] (ICD-10-CM) - Strain of abdominal wall, initial encounter   Rationale for  Evaluation and Treatment: Rehabilitation  THERAPY DIAG:  Pain in thoracic spine  Cramp and spasm  ONSET DATE: June 14th, 2024  SUBJECTIVE:                                                                                                                                                                                           SUBJECTIVE STATEMENT: Pt reports she is sore, but he soreness is at a lower level and she is able to manage it better. Pt walked 1.5 miles yesterday. Activity  level is improving. Pt notes she is more tired at the end of the week when she has less time to rest.  EVAL: Pt reports she strained her R ant/lat chest/abdominal area when lifting a package at [employer]. Pt notes the pain can radiate to her R back and her R upper shoulder hurts as well. At the time of the injury, the pt states the pain was intense and she felt like she was going to throw up. Pt indicates there was an area of swelling which ran along her ant/lat chest from under her R breast to her last rib. Following the injury, she worked on [employer] duty (small sort) for 1 week. Currently she is out of work on [employer]. She can return to work on light duty when she can tolerate lifting 15 lbs for 3.5 hours. In addition to pain, pt reports muscle spasms in the area. Pt has 6 children.  Pt is Rt handed  PERTINENT HISTORY:  NA  PAIN:  Are you having pain? Yes: NPRS scale:R neck and shoulder 3/10; R abdomin/side 7/10 Pain location: R ant/lat chest/abdominal area, radiates to R back, R upper shoulder Pain description: sharp, ache , throb, burning, spasms Aggravating factors: Driving Relieving factors: Cold pack, hot bath soaks Pain range on eval: 3-8/10  PRECAUTIONS: None  WEIGHT BEARING RESTRICTIONS: No  FALLS:  Has patient fallen in last 6 months? 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 Pt's need to be able to lift 75# to return to full duty  PLOF: Independent  PATIENT GOALS: Pain relief and to return to work  OBJECTIVE:   DIAGNOSTIC FINDINGS:  NA  PATIENT SURVEYS:  FOTO: Perceived function   31%, predicted   57%  FOTO 38 10/09/22  SCREENING FOR RED FLAGS: Bowel or bladder incontinence:   COGNITION: Overall cognitive status: Within functional limits for tasks assessed     SENSATION: WFL  MUSCLE LENGTH: Hamstrings: Right NT deg; Left NT deg Maisie Fus test: Right NT deg; Left NT deg  POSTURE: rounded shoulders and forward head  PALPATION: TTP to the ant/lat R rib cage from the 5th to 12th ribs and of the R upper trap  UE MMT:  MMT Right 09/04/22 Left 09/04/22  Shoulder flexion 4p 5  Shoulder extension 4p 5  Shoulder abduction 4p 5  Shoulder adduction 4p 5  Shoulder extension 4p 5  Shoulder internal rotation 4p 5  Shoulder external rotation 4p 5  Elbow flexion 5 5  Elbow extension 5 5  Wrist flexion    Wrist extension    Wrist ulnar deviation    Wrist radial deviation    Wrist pronation    Wrist supination     (Blank rows = not tested) P=denotes concordant pain  UE ROM:  AROM Right 09/04/22 Left 09/04/22 RT 10/07/22  Shoulder flexion 100 150 130  Shoulder extension     Shoulder abduction 100 150 120  Shoulder adduction     Shoulder extension     Shoulder internal rotation     Shoulder external rotation     Middle trapezius     Lower trapezius     Elbow flexion     Elbow extension     Wrist flexion     Wrist extension     Wrist ulnar deviation     Wrist radial deviation  Wrist pronation     Wrist supination     Grip strength      (Blank rows = not tested)  TODAY'S TREATMENT:  [facility] Adult PT Treatment:                                                DATE: 10/14/22 Therapeutic Exercise: Waist to shoulder height lifting c 3# and 5# x10 each Shoulder flexion to  shoulder height 2x10 2# Serratus wall press 2x10 Standing marching 2x10 SLS, R 15, 12= 13.5"; L 20,14= 17" Standing shoulder row 2x10 GTB Leg press cybex 2x10 60"  [facility] Adult PT Treatment:                                                DATE: 10/09/22 Therapeutic Exercise: Seated row 10# x 8 Chest press 5# x8  Leg press 20# x 10 Supine march while holding 3 lbs chest height  Supine dowel pullover 3# x 8  Hooklying isometric hip flexion using UE - 5 sec x 3 each - 2 sets  Hooklying isometric hip flexion using isometric UE - 5 sec x 3 each- 2 sets S/L shoulder abduction  ER AROM S/L shoulder ER AROM  Therapeutic Activity: FOTO 38 (improved from 31)   [facility] Adult PT Treatment:                                                DATE: 10/07/22 Therapeutic Exercise: Nustep 8 mins L5 UE/LE LTR x10 5" Open books x10" Supine shoulder flex/ext c dowel: 90d c 3lbs x10. At 90d for serratus press 5 lbs Abdominal set c marching x10 Cat/camel x10 Child pose stretch forward and lateral                                 PATIENT EDUCATION:  Education details: Eval findings, POC, HEP, self care  Person educated: Patient Education method: Explanation, Demonstration, Tactile cues, Verbal cues, and Handouts Education comprehension: verbalized understanding, returned demonstration, verbal cues required, tactile cues required, and needs further education  HOME EXERCISE PROGRAM: Access Code: [email or web address] URL: [email or web address] Date: 09/18/2022 Prepared by: Joellyn Rued  Exercises - Standing 'L' Stretch at Counter  - 2 x daily - 7 x weekly - 1 sets - 10 reps - 5-20 hold - Shoulder Flexion Wall Slide with Towel  - 2 x daily - 7 x weekly - 1 sets - 10 reps - 5=10 hold - Supine Posterior Pelvic Tilt  - 2 x daily - 7 x weekly - 1 sets - 10 reps - 3 hold - Supine Shoulder Flexion Extension AAROM with Dowel  - 2 x daily - 7 x weekly - 1 sets - 10 reps - Standing Anti-Rotation Press with Anchored  Resistance  - 2 x daily - 7 x weekly - 1 sets - 10 reps - 3 hold - Seated Upper Trapezius Stretch  - 1 x daily - 7 x weekly - 1 sets - 3 reps - 15-30 hold - Gentle Levator Scapulae Stretch  -  1 x daily - 7 x weekly - 1 sets - 3 reps - 15-30 hold - Hooklying Rib Cage Breathing  - 2 x daily - 7 x weekly - 2 sets - 5 reps - 5 hold - Supine March  - 2 x daily - 7 x weekly - 3 sets - 10 reps - Hooklying Isometric Hip Flexion  - 1 x daily - 7 x weekly - 2 sets - 5 reps - 5 hold - Hooklying Isometric Hip Flexion with Opposite Arm  - 1 x daily - 7 x weekly - 2 sets - 5 reps - 5 hold  ASSESSMENT:  CLINICAL IMPRESSION: PT was completed for core, hip, shoulder strengthening and ROM. Pt is tolerating gradual progression of increased phyical demand per therex. Pt reports her R neck and lateral trunk pain are slowly improving. Pt's improved R UE AROM and improved tolerance of resistive therex are indicative of the pt's decreased pain and progress. Pt's progress is slow, but appropriate considering the degree of injury. Recommend the continuation of OPPT 2w6 to address pt's impairments for return of function and QOL.  OBJECTIVE IMPAIRMENTS: decreased activity tolerance, decreased ROM, decreased strength, increased muscle spasms, and pain.   ACTIVITY LIMITATIONS: carrying, lifting, dressing, reach over head, and caring for others  PARTICIPATION LIMITATIONS: meal prep, cleaning, laundry, driving, occupation, and yard work  PERSONAL FACTORS: Time since onset of injury/illness/exacerbation are also affecting patient's functional outcome.   REHAB POTENTIAL: Excellent  CLINICAL DECISION MAKING: Stable/uncomplicated  EVALUATION COMPLEXITY: Low   GOALS:  SHORT TERM GOALS: Target date: 09/26/22  Pt will be Ind in an initial HEP  Baseline: started Goal status: MET 2.  Pt will voice understanding of measures to assist in pain reduction  Baseline: started 09/25/22: use of cold pack, moist heat, knee bolster  for sleep position Goal status: MET   LONG TERM GOALS: Target date: 11/07/22  Pt will be Ind in a final HEP to maintain achieved LOF  Baseline: started Goal status: INITIAL  2.  Increase R shoulder flexion and abd to 145d for appropriate upper body function Baseline: 100d for both 10/07/22: Flex 130; abd 120 Goal status: Improved- ongoing  3.  Pt will report a decrease in pain to 0-3/10 with daily and work related activities form improved QOL and to return back to work Baseline: 3-8/10 Goal status: Improving gradually per pt's subjective report  4.  Increase R shoulder strength to 4+/5 or greater for improved R UE function for return to work  Baseline: See flow sheets Goal status: Ongoing  5.  Pt will be able to handle 15#, lifting from floor to waist height and from one waist height surface to another for 2 set of 10 each Baseline: not tested 10/14/22: 3# and 5# waist to shoulder height x10 each Goal status: Improved- Ongoing  6.  Pt will be able to handle 75#, lifting from floor to waist height and from one waist height surface to another for 2 sets of 3 each Baseline: not tested Goal status: Ongoing  7.  Pt's FOTO score will improved to the predicted value of 57% as indication of improved function  Baseline: 31% 10/09/22: 38%  Goal status: Ongoing  PLAN:  PT FREQUENCY: 2x/week  PT DURATION: 6 weeks  PLANNED INTERVENTIONS: Therapeutic exercises, Therapeutic activity, Patient/Family education, Self Care, Joint mobilization, Dry Needling, Electrical stimulation, Cryotherapy, Moist heat, Taping, Ultrasound, Ionotophoresis 4mg /ml Dexamethasone, Manual therapy, and Re-evaluation.  PLAN FOR NEXT SESSION: Review FOTO; assess response to HEP;  progress therex as indicated; use of modalities, manual therapy; and TPDN as indicated.  Vernell Townley MS, PT 10/14/22 5:30 PM  Check all possible CPT codes: 13086 - PT Re-evaluation, 97110- Therapeutic Exercise, 97140 - Manual Therapy,  97530 - Therapeutic Activities, 97535 - Self Care, [phone] - Electrical stimulation (Manual), [id number] - Ultrasound, and [id number] - Aquatic therapy    Check all conditions that are expected to impact treatment: {Conditions expected to impact treatment:Musculoskeletal disorders   If treatment provided at initial evaluation, no treatment charged due to lack of authorization.

## 2022-10-14 ENCOUNTER — Telehealth: Payer: Self-pay | Admitting: Internal Medicine

## 2022-10-14 ENCOUNTER — Ambulatory Visit: Payer: BC Managed Care – PPO

## 2022-10-14 DIAGNOSIS — R252 Cramp and spasm: Secondary | ICD-10-CM

## 2022-10-14 DIAGNOSIS — M546 Pain in thoracic spine: Secondary | ICD-10-CM | POA: Diagnosis not present

## 2022-10-14 NOTE — Telephone Encounter (Signed)
[name],   Patient called stating that she needed to cancel her colonoscopy scheduled for 8/22 at 11:00 due to  not having transportation.   Patient stated she will call back at a later time to reschedule.

## 2022-10-16 ENCOUNTER — Encounter: Payer: BC Managed Care – PPO | Admitting: Internal Medicine

## 2022-10-16 NOTE — Therapy (Signed)
OUTPATIENT PHYSICAL THERAPY THORACOLUMBAR TREATMENT NOTE/Re-Auth   Patient Name: Kristen [name] MRN: [id number] DOB:08-24-1980, 42 y.o., female Today's Date: 10/17/2022  END OF SESSION:  PT End of Session - 10/17/22 1114     Visit Number 10    Number of Visits 17    Date for PT Re-Evaluation 11/07/22    Authorization Type BCBS COMM PPO/[place] MEDICAID HEALTHY [place]; [employer] Medicaid   Approved 9 visits 09/16/22-11/14/22    Authorization - Visit Number 9    Authorization - Number of Visits 9    PT Start Time 1105    PT Stop Time 1145    PT Time Calculation (min) 40 min    Activity Tolerance Patient tolerated treatment well    Behavior During Therapy WFL for tasks assessed/performed                    Past Medical History:  Diagnosis Date   Alpha thalassemia (HCC)    Asthma    uses inhaler daily/as needed   Complication of anesthesia    reaction to medication in epidural -morphine   Endometriosis    Preterm labor with preterm delivery    Seasonal allergies    Tubular adenoma of colon    Past Surgical History:  Procedure Laterality Date   CESAREAN SECTION     CESAREAN SECTION N/A 10/29/2015   Procedure: CESAREAN SECTION;  Surgeon: Adam Phenix, MD;  Location: [facility] BIRTHING SUIT[facility];  Service: Obstetrics;  Laterality: N/A;   DILATION AND CURETTAGE OF UTERUS     WISDOM TOOTH EXTRACTION  2014   Patient Active Problem List   Diagnosis Date Noted   Sprain of abdominal wall 09/23/2022   Vitamin D deficiency 07/10/2022   Microcytosis 07/10/2022   Alpha thalassemia (HCC) 07/10/2022   Obesity (BMI 30.0-34.9) 07/09/2022   Hypnagogic hallucinations 10/22/2016   Gestational diabetes mellitus (GDM) affecting fifth pregnancy 10/22/2016   Unwanted fertility 09/17/2016   Bowel trouble 02/07/2016   Slow transit constipation 02/07/2016    PCP: Philip Aspen, Limmie Patricia, MD   REFERRING PROVIDER: Philip Aspen, Limmie Patricia, MD   REFERRING DIAG: [phone]  (ICD-10-CM) - Strain of abdominal wall, initial encounter   Rationale for Evaluation and Treatment: Rehabilitation  THERAPY DIAG:  Pain in thoracic spine  Cramp and spasm  ONSET DATE: June 14th, 2024  SUBJECTIVE:                                                                                                                                                                                           SUBJECTIVE STATEMENT: Pt reports she is taking a combination of tylenol,  ibuprofen, whcich s helping to manage the pain better.Pr reports she has been taking mile long walks. Pt is able to do lighter household chores, ie folding laundry. Overall, pt is more encouraged.  EVAL: Pt reports she strained her R ant/lat chest/abdominal area when lifting a package at [employer]. Pt notes the pain can radiate to her R back and her R upper shoulder hurts as well. At the time of the injury, the pt states the pain was intense and she felt like she was going to throw up. Pt indicates there was an area of swelling which ran along her ant/lat chest from under her R breast to her last rib. Following the injury, she worked on [employer] duty (small sort) for 1 week. Currently she is out of work on [employer]. She can return to work on light duty when she can tolerate lifting 15 lbs for 3.5 hours. In addition to pain, pt reports muscle spasms in the area. Pt has 6 children.  Pt is Rt handed  PERTINENT HISTORY:  NA  PAIN:  Are you having pain? Yes: NPRS scale:R neck and shoulder 3/10; R abdomin/side 5/10 Pain location: R ant/lat chest/abdominal area, radiates to R back, R upper shoulder Pain description: sharp, ache , throb, burning, spasms Aggravating factors: Driving Relieving factors: Cold pack, hot bath soaks Pain range on eval: 3-8/10  PRECAUTIONS: None  WEIGHT BEARING RESTRICTIONS: No  FALLS:  Has patient fallen in last 6 months? 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 Pt's need to be able to lift 75# to return to full duty  PLOF: Independent  PATIENT GOALS: Pain relief and to return to work  OBJECTIVE:   DIAGNOSTIC FINDINGS:  NA  PATIENT SURVEYS:  FOTO: Perceived function   31%, predicted   57%  FOTO 38 10/09/22  SCREENING FOR RED FLAGS: Bowel or bladder incontinence:   COGNITION: Overall cognitive status: Within functional limits for tasks assessed     SENSATION: WFL  MUSCLE LENGTH: Hamstrings: Right NT deg; Left NT deg Maisie Fus test: Right NT deg; Left NT deg  POSTURE: rounded shoulders and forward head  PALPATION: TTP to the ant/lat R rib cage from the 5th to 12th ribs and of the R upper trap  UE MMT:  MMT Right 09/04/22 Left 09/04/22  Shoulder flexion 4p 5  Shoulder extension 4p 5  Shoulder abduction 4p 5  Shoulder adduction 4p 5  Shoulder extension 4p 5  Shoulder internal rotation 4p 5  Shoulder external rotation 4p 5  Elbow flexion 5 5  Elbow extension 5 5  Wrist flexion    Wrist extension    Wrist ulnar deviation    Wrist radial deviation    Wrist pronation    Wrist supination     (Blank rows = not tested) P=denotes concordant pain  UE ROM:  AROM Right 09/04/22 Left 09/04/22 RT 10/07/22  Shoulder flexion 100 150 130  Shoulder extension     Shoulder abduction 100 150 120  Shoulder adduction     Shoulder extension     Shoulder internal rotation     Shoulder external rotation     Middle trapezius     Lower trapezius     Elbow flexion     Elbow extension     Wrist flexion     Wrist extension  Wrist ulnar deviation     Wrist radial deviation     Wrist pronation     Wrist supination     Grip strength      (Blank rows = not tested)  TODAY'S TREATMENT:  [facility] Adult PT Treatment:                                                DATE: 10/17/22 Therapeutic  Exercise: Nustep L5 Ue/LE Pectoral doorway stretch x2 30" R lateral side doorway stretch x2 30" Lat pull c GTB 2x10 Tricep pull downs c GTB 2x10 Pall[place] side steps with press double RTB x10 each Hinge hip lifting 2x10 c 10# box Self Care: Instruction in proper technique for hinged hip lifting  [facility] Adult PT Treatment:                                                DATE: 10/14/22 Therapeutic Exercise: Waist to shoulder height lifting c 3# and 5# x10 each Shoulder flexion to shoulder height 2x10 2# Serratus wall press 2x10 Standing marching 2x10 SLS, R 15, 12= 13.5"; L 20,14= 17" Standing shoulder row 2x10 GTB Leg press cybex 2x10 60"  [facility] Adult PT Treatment:                                                DATE: 10/09/22 Therapeutic Exercise: Seated row 10# x 8 Chest press 5# x8  Leg press 20# x 10 Supine march while holding 3 lbs chest height  Supine dowel pullover 3# x 8  Hooklying isometric hip flexion using UE - 5 sec x 3 each - 2 sets  Hooklying isometric hip flexion using isometric UE - 5 sec x 3 each- 2 sets S/L shoulder abduction  ER AROM S/L shoulder ER AROM  Therapeutic Activity: FOTO 38 (improved from 31)                                 PATIENT EDUCATION:  Education details: Eval findings, POC, HEP, self care  Person educated: Patient Education method: Explanation, Demonstration, Tactile cues, Verbal cues, and Handouts Education comprehension: verbalized understanding, returned demonstration, verbal cues required, tactile cues required, and needs further education  HOME EXERCISE PROGRAM: Access Code: [email or web address] URL: [email or web address] Date: 09/18/2022 Prepared by: Joellyn Rued  Exercises - Standing 'L' Stretch at Counter  - 2 x daily - 7 x weekly - 1 sets - 10 reps - 5-20 hold - Shoulder Flexion Wall Slide with Towel  - 2 x daily - 7 x weekly - 1 sets - 10 reps - 5=10 hold - Supine Posterior Pelvic Tilt  - 2 x daily - 7 x weekly - 1 sets -  10 reps - 3 hold - Supine Shoulder Flexion Extension AAROM with Dowel  - 2 x daily - 7 x weekly - 1 sets - 10 reps - Standing Anti-Rotation Press with Anchored Resistance  - 2 x daily - 7 x weekly - 1 sets - 10 reps - 3 hold - Seated Upper  Trapezius Stretch  - 1 x daily - 7 x weekly - 1 sets - 3 reps - 15-30 hold - Gentle Levator Scapulae Stretch  - 1 x daily - 7 x weekly - 1 sets - 3 reps - 15-30 hold - Hooklying Rib Cage Breathing  - 2 x daily - 7 x weekly - 2 sets - 5 reps - 5 hold - Supine March  - 2 x daily - 7 x weekly - 3 sets - 10 reps - Hooklying Isometric Hip Flexion  - 1 x daily - 7 x weekly - 2 sets - 5 reps - 5 hold - Hooklying Isometric Hip Flexion with Opposite Arm  - 1 x daily - 7 x weekly - 2 sets - 5 reps - 5 hold  ASSESSMENT:  CLINICAL IMPRESSION: PT was continued for core, hip, and shoulder strengthening and flexibility. Physical demand was gradually progressed.  Instructed in proper technique for hinged hip lifting. Initiated floor to waist lifting c 10#. Pt is making slows gains re: strength and physical tolerance. Significant will be needed in order to be able to return to her position as a package handler/loader. Pt will continue to benefit from skilled PT to address impairments for improved function c minimized pain.   OBJECTIVE IMPAIRMENTS: decreased activity tolerance, decreased ROM, decreased strength, increased muscle spasms, and pain.   ACTIVITY LIMITATIONS: carrying, lifting, dressing, reach over head, and caring for others  PARTICIPATION LIMITATIONS: meal prep, cleaning, laundry, driving, occupation, and yard work  PERSONAL FACTORS: Time since onset of injury/illness/exacerbation are also affecting patient's functional outcome.   REHAB POTENTIAL: Excellent  CLINICAL DECISION MAKING: Stable/uncomplicated  EVALUATION COMPLEXITY: Low   GOALS:  SHORT TERM GOALS: Target date: 09/26/22  Pt will be Ind in an initial HEP  Baseline: started Goal status:  MET 2.  Pt will voice understanding of measures to assist in pain reduction  Baseline: started 09/25/22: use of cold pack, moist heat, knee bolster for sleep position Goal status: MET   LONG TERM GOALS: Target date: 11/07/22  Pt will be Ind in a final HEP to maintain achieved LOF  Baseline: started Goal status: INITIAL  2.  Increase R shoulder flexion and abd to 145d for appropriate upper body function Baseline: 100d for both 10/07/22: Flex 130; abd 120 Goal status: Improved- ongoing  3.  Pt will report a decrease in pain to 0-3/10 with daily and work related activities form improved QOL and to return back to work Baseline: 3-8/10 Goal status: Improving gradually per pt's subjective report  4.  Increase R shoulder strength to 4+/5 or greater for improved R UE function for return to work  Baseline: See flow sheets Goal status: Ongoing  5.  Pt will be able to handle 15#, lifting from waist to shoulder height 2 set of 10 each Baseline: not tested 10/14/22: 3# and 5# waist to shoulder height x10 each Goal status: Improved- Ongoing  6.  Pt will be able to handle 75#, lifting from floor to waist height  2 sets of 10 Baseline: not tested 10/17/22: 10# hinged hip lifting Goal status: Ongoing  7.  Pt's FOTO score will improved to the predicted value of 57% as indication of improved function  Baseline: 31% 10/09/22: 38%  Goal status: Ongoing  PLAN:  PT FREQUENCY: 2x/week  PT DURATION: 6 weeks  PLANNED INTERVENTIONS: Therapeutic exercises, Therapeutic activity, Patient/Family education, Self Care, Joint mobilization, Dry Needling, Electrical stimulation, Cryotherapy, Moist heat, Taping, Ultrasound, Ionotophoresis 4mg /ml Dexamethasone, Manual therapy, and  Re-evaluation.  PLAN FOR NEXT SESSION: Review FOTO; assess response to HEP; progress therex as indicated; use of modalities, manual therapy; and TPDN as indicated.  Conor Lata MS, PT 10/17/22 12:23 PM

## 2022-10-17 ENCOUNTER — Ambulatory Visit: Payer: BC Managed Care – PPO

## 2022-10-17 DIAGNOSIS — M546 Pain in thoracic spine: Secondary | ICD-10-CM | POA: Diagnosis not present

## 2022-10-17 DIAGNOSIS — R252 Cramp and spasm: Secondary | ICD-10-CM | POA: Diagnosis not present

## 2022-10-20 ENCOUNTER — Ambulatory Visit (INDEPENDENT_AMBULATORY_CARE_PROVIDER_SITE_OTHER): Payer: BC Managed Care – PPO | Admitting: Internal Medicine

## 2022-10-20 ENCOUNTER — Encounter: Payer: Self-pay | Admitting: Internal Medicine

## 2022-10-20 VITALS — BP 110/80 | HR 88 | Temp 98.1°F | Wt 210.1 lb

## 2022-10-20 DIAGNOSIS — Z23 Encounter for immunization: Secondary | ICD-10-CM | POA: Diagnosis not present

## 2022-10-20 DIAGNOSIS — S39011D Strain of muscle, fascia and tendon of abdomen, subsequent encounter: Secondary | ICD-10-CM

## 2022-10-20 NOTE — Progress Notes (Signed)
Established Patient Office Visit     CC/Reason for Visit: Abdominal wall muscle strain  HPI: Kristen [name] is a 42 y.o. female who is coming in today for the above mentioned reasons.  She has been dealing with this now for a few months.  She continues to attend physical therapy.  She has taken muscle relaxers and NSAIDs which provide some mild to moderate relief.  She does not feel like she is quite ready to go back to work.  Her work does not offer light duty  so she would like to extend her short-term disability.  Has forms that need to be filled out today.   Past Medical/Surgical History: Past Medical History:  Diagnosis Date   Alpha thalassemia (HCC)    Asthma    uses inhaler daily/as needed   Complication of anesthesia    reaction to medication in epidural -morphine   Endometriosis    Preterm labor with preterm delivery    Seasonal allergies    Tubular adenoma of colon     Past Surgical History:  Procedure Laterality Date   CESAREAN SECTION     CESAREAN SECTION N/A 10/29/2015   Procedure: CESAREAN SECTION;  Surgeon: Adam Phenix, MD;  Location: [facility] BIRTHING SUITES;  Service: Obstetrics;  Laterality: N/A;   DILATION AND CURETTAGE OF UTERUS     WISDOM TOOTH EXTRACTION  2014    Social History:  reports that she has quit smoking. Her smoking use included cigars. She has never been exposed to tobacco smoke. She has never used smokeless tobacco. She reports that she does not drink alcohol and does not use drugs.  Allergies: Allergies  Allergen Reactions   Hydrocodone Shortness Of Breath   Morphine And Codeine Shortness Of Breath and Itching    Burning   Penicillins Hives    Has patient had a PCN reaction causing immediate rash, facial/tongue/throat swelling, SOB or lightheadedness with hypotension: Yes Has patient had a PCN reaction causing severe rash involving mucus membranes or skin necrosis: Yes Has patient had a PCN reaction that required  hospitalization: No Has patient had a PCN reaction occurring within the last 10 years: Yes If all of the above answers are "NO", then may proceed with Cephalosporin use.    Percocet [Oxycodone-Acetaminophen] Anaphylaxis    Pt has taken hydromorphone without complications in the past    Family History:  Family History  Problem Relation Age of Onset   HIV Father    Colon cancer Maternal Uncle    Diabetes Maternal Grandmother    Diabetes Paternal Grandmother    Stomach cancer Neg Hx    Rectal cancer Neg Hx    Esophageal cancer Neg Hx    Liver cancer Neg Hx    Colon polyps Neg Hx      Current Outpatient Medications:    albuterol (VENTOLIN HFA) 108 (90 Base) MCG/ACT inhaler, Inhale 2 puffs into the lungs every 4 (four) hours as needed for wheezing or shortness of breath., Disp: 18 g, Rfl: 1   cetirizine (ZYRTEC) 10 MG tablet, Take 1 tablet (10 mg total) by mouth daily., Disp: 30 tablet, Rfl: 11   fluticasone (FLONASE) 50 MCG/ACT nasal spray, Place 2 sprays into both nostrils daily., Disp: , Rfl:    triamcinolone cream (KENALOG) 0.1 %, Apply 1 Application topically 2 (two) times daily., Disp: 30 g, Rfl: 0   VITAMIN D PO, Take 1 tablet by mouth daily at 6 (six) AM., Disp: , Rfl:   Review  of Systems:  Negative unless indicated in HPI.   Physical Exam: Vitals:   10/20/22 1005  BP: 110/80  Pulse: 88  Temp: 98.1 F (36.7 C)  TempSrc: Oral  SpO2: 99%  Weight: 210 lb 1.6 oz (95.3 kg)    Body mass index is 36.63 kg/m.   Physical Exam Vitals reviewed.  Constitutional:      Appearance: Normal appearance.  HENT:     Head: Normocephalic and atraumatic.  Eyes:     Conjunctiva/sclera: Conjunctivae normal.     Pupils: Pupils are equal, round, and reactive to light.  Skin:    General: Skin is warm and dry.  Neurological:     General: No focal deficit present.     Mental Status: She is alert and oriented to person, place, and time.  Psychiatric:        Mood and Affect: Mood  normal.        Behavior: Behavior normal.        Thought Content: Thought content normal.        Judgment: Judgment normal.      Impression and Plan:  Strain of abdominal wall, subsequent encounter  Immunization due  -Flu vaccine administered today. -She continues to have pain with torso movements.  She has been attending PT faithfully.  She is encouraged by some mild to moderate improvement with NSAIDs but feels like it is taking longer than expected to resolve.  I will extend her short-term disability.   Time spent:23 minutes reviewing chart, interviewing and examining patient and formulating plan of care.     Chaya Jan, MD [facility]

## 2022-10-20 NOTE — Addendum Note (Signed)
Addended by: Kern Reap B on: 10/20/2022 11:37 AM   Modules accepted: Orders

## 2022-10-20 NOTE — Therapy (Signed)
OUTPATIENT PHYSICAL THERAPY THORACOLUMBAR TREATMENT NOTE   Patient Name: Kristen [name] MRN: [id number] DOB:07/11/1980, 42 y.o., female Today's Date: 10/21/2022  END OF SESSION:  PT End of Session - 10/21/22 1126     Visit Number 11    Number of Visits 17    Date for PT Re-Evaluation 11/07/22    Authorization Type BCBS COMM PPO/[place] MEDICAID [place]; [employer] Medicaid   Approved 9 visits 09/16/22-11/14/22    Authorization - Visit Number 1    Authorization - Number of Visits 4    PT Start Time 1105    PT Stop Time 1150    PT Time Calculation (min) 45 min    Activity Tolerance Patient tolerated treatment well    Behavior During Therapy WFL for tasks assessed/performed                     Past Medical History:  Diagnosis Date   Alpha thalassemia (HCC)    Asthma    uses inhaler daily/as needed   Complication of anesthesia    reaction to medication in epidural -morphine   Endometriosis    Preterm labor with preterm delivery    Seasonal allergies    Tubular adenoma of colon    Past Surgical History:  Procedure Laterality Date   CESAREAN SECTION     CESAREAN SECTION N/A 10/29/2015   Procedure: CESAREAN SECTION;  Surgeon: Adam Phenix, MD;  Location: [facility] BIRTHING SUITES;  Service: Obstetrics;  Laterality: N/A;   DILATION AND CURETTAGE OF UTERUS     WISDOM TOOTH EXTRACTION  2014   Patient Active Problem List   Diagnosis Date Noted   Sprain of abdominal wall 09/23/2022   Vitamin D deficiency 07/10/2022   Microcytosis 07/10/2022   Alpha thalassemia (HCC) 07/10/2022   Obesity (BMI 30.0-34.9) 07/09/2022   Hypnagogic hallucinations 10/22/2016   Gestational diabetes mellitus (GDM) affecting fifth pregnancy 10/22/2016   Unwanted fertility 09/17/2016   Bowel trouble 02/07/2016   Slow transit constipation 02/07/2016    PCP: Philip Aspen, Limmie Patricia, MD   REFERRING PROVIDER: Philip Aspen, Limmie Patricia, MD   REFERRING DIAG: [phone] (ICD-10-CM)  - Strain of abdominal wall, initial encounter   Rationale for Evaluation and Treatment: Rehabilitation  THERAPY DIAG:  Pain in thoracic spine  Cramp and spasm  ONSET DATE: June 14th, 2024  SUBJECTIVE:                                                                                                                                                                                           SUBJECTIVE STATEMENT: Overall better. Pt states she slept for 5  hours straight which is the longest she has done so in a while. 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  EVAL: Pt reports she strained her R ant/lat chest/abdominal area when lifting a package at [employer]. Pt notes the pain can radiate to her R back and her R upper shoulder hurts as well. At the time of the injury, the pt states the pain was intense and she felt like she was going to throw up. Pt indicates there was an area of swelling which ran along her ant/lat chest from under her R breast to her last rib. Following the injury, she worked on [employer] duty (small sort) for 1 week. Currently she is out of work on [employer]. She can return to work on light duty when she can tolerate lifting 15 lbs for 3.5 hours. In addition to pain, pt reports muscle spasms in the area. Pt has 6 children.  Pt is Rt handed  PERTINENT HISTORY:  NA  PAIN:  Are you having pain? Yes: NPRS scale:R neck and shoulder 3/10; R abdomin/side 4/10 Pain location: R ant/lat chest/abdominal area, radiates to R back, R upper shoulder Pain description: sharp, ache , throb, burning, spasms Aggravating factors: Driving Relieving factors: Cold pack, hot bath soaks Pain range on eval: 3-8/10  PRECAUTIONS: None  WEIGHT BEARING RESTRICTIONS: No  FALLS:  Has patient fallen in last 6 months? 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 Pt's need to be able to lift 75# to return to full duty  PLOF: Independent  PATIENT GOALS: Pain relief and to return to work  OBJECTIVE:   DIAGNOSTIC FINDINGS:  NA  PATIENT SURVEYS:  FOTO: Perceived function   31%, predicted   57%  FOTO 38 10/09/22  SCREENING FOR RED FLAGS: Bowel or bladder incontinence:   COGNITION: Overall cognitive status: Within functional limits for tasks assessed     SENSATION: WFL  MUSCLE LENGTH: Hamstrings: Right NT deg; Left NT deg Maisie Fus test: Right NT deg; Left NT deg  POSTURE: rounded shoulders and forward head  PALPATION: TTP to the ant/lat R rib cage from the 5th to 12th ribs and of the R upper trap  UE MMT:  MMT Right 09/04/22 Left 09/04/22  Shoulder flexion 4p 5  Shoulder extension 4p 5  Shoulder abduction 4p 5  Shoulder adduction 4p 5  Shoulder extension 4p 5  Shoulder internal rotation 4p 5  Shoulder external rotation 4p 5  Elbow flexion 5 5  Elbow extension 5 5  Wrist flexion    Wrist extension    Wrist ulnar deviation    Wrist radial deviation    Wrist pronation    Wrist supination     (Blank rows = not tested) P=denotes concordant pain  UE ROM:  AROM Right 09/04/22 Left 09/04/22 RT 10/07/22  Shoulder flexion 100 150 130  Shoulder extension     Shoulder abduction 100 150 120  Shoulder adduction     Shoulder extension     Shoulder internal rotation     Shoulder external rotation     Middle trapezius     Lower trapezius     Elbow flexion     Elbow extension     Wrist flexion     Wrist extension     Wrist ulnar deviation  Wrist radial deviation     Wrist pronation     Wrist supination     Grip strength      (Blank rows = not tested)  TODAY'S TREATMENT: [facility] Adult PT Treatment:                                                DATE: 10/21/22 Therapeutic Exercise: UBE L1 in each direction Waist to shoulder height lifting R 6#  2x10; L #8 2x10 R Upper trap stretch and L and R cervical rotation x2 each 15" Trunk rotation 2RTBs 2x10 Manual Therapy: STM to the R upper trap and lower cervical paraspinals  Skilled palpation to identify TrPs and taut muscle bands Trigger Point Dry Needling Treatment: Pre-treatment instruction: Patient instructed on dry needling rationale, procedures, and possible side effects including pain during treatment (achy,cramping feeling), bruising, drop of blood, lightheadedness, nausea, sweating. Patient Consent Given: Yes Education handout provided: Yes Muscles treated: R upper trap, R lower cervical multifidi and paraspinals Needle size and number: .30x24mm x 2 Electrical stimulation performed: No Parameters: N/A Treatment response/outcome: Twitch response elicited and Palpable decrease in muscle tension Post-treatment instructions: Patient instructed to expect possible mild to moderate muscle soreness later today and/or tomorrow. Patient instructed in methods to reduce muscle soreness and to continue prescribed HEP. If patient was dry needled over the lung field, patient was instructed on signs and symptoms of pneumothorax and, however unlikely, to see immediate medical attention should they occur. Patient was also educated on signs and symptoms of infection and to seek medical attention should they occur. Patient verbalized understanding of these instructions and education.    [facility] Adult PT Treatment:                                                DATE: 10/17/22 Therapeutic Exercise: Nustep L5 Ue/LE Pectoral doorway stretch x2 30" R lateral side doorway stretch x2 30" Lat pull c GTB 2x10 Tricep pull downs c GTB 2x10 Palloff side steps with press double RTB x10 each Hinge hip lifting 2x10 c 10# box Self Care: Instruction in proper technique for hinged hip lifting  [facility] Adult PT Treatment:                                                DATE: 10/14/22 Therapeutic Exercise: Waist to  shoulder height lifting c 3# and 5# x10 each Shoulder flexion to shoulder height 2x10 2# Serratus wall press 2x10 Standing marching 2x10 SLS, R 15, 12= 13.5"; L 20,14= 17" Standing shoulder row 2x10 GTB Leg press cybex 2x10 60"  [facility] Adult PT Treatment:                                                DATE: 10/09/22 Therapeutic Exercise: Seated row 10# x 8 Chest press 5# x8  Leg press 20# x 10 Supine march while holding 3 lbs chest height  Supine dowel pullover 3# x  8  Hooklying isometric hip flexion using UE - 5 sec x 3 each - 2 sets  Hooklying isometric hip flexion using isometric UE - 5 sec x 3 each- 2 sets S/L shoulder abduction  ER AROM S/L shoulder ER AROM  Therapeutic Activity: FOTO 38 (improved from 31)                                 PATIENT EDUCATION:  Education details: Eval findings, POC, HEP, self care  Person educated: Patient Education method: Explanation, Demonstration, Tactile cues, Verbal cues, and Handouts Education comprehension: verbalized understanding, returned demonstration, verbal cues required, tactile cues required, and needs further education  HOME EXERCISE PROGRAM: Access Code: [email or web address] URL: [email or web address] Date: 09/18/2022 Prepared by: Joellyn Rued  Exercises - Standing 'L' Stretch at Counter  - 2 x daily - 7 x weekly - 1 sets - 10 reps - 5-20 hold - Shoulder Flexion Wall Slide with Towel  - 2 x daily - 7 x weekly - 1 sets - 10 reps - 5=10 hold - Supine Posterior Pelvic Tilt  - 2 x daily - 7 x weekly - 1 sets - 10 reps - 3 hold - Supine Shoulder Flexion Extension AAROM with Dowel  - 2 x daily - 7 x weekly - 1 sets - 10 reps - Standing Anti-Rotation Press with Anchored Resistance  - 2 x daily - 7 x weekly - 1 sets - 10 reps - 3 hold - Seated Upper Trapezius Stretch  - 1 x daily - 7 x weekly - 1 sets - 3 reps - 15-30 hold - Gentle Levator Scapulae Stretch  - 1 x daily - 7 x weekly - 1 sets - 3 reps - 15-30 hold - Hooklying Rib  Cage Breathing  - 2 x daily - 7 x weekly - 2 sets - 5 reps - 5 hold - Supine March  - 2 x daily - 7 x weekly - 3 sets - 10 reps - Hooklying Isometric Hip Flexion  - 1 x daily - 7 x weekly - 2 sets - 5 reps - 5 hold - Hooklying Isometric Hip Flexion with Opposite Arm  - 1 x daily - 7 x weekly - 2 sets - 5 reps - 5 hold  ASSESSMENT:  CLINICAL IMPRESSION: Today PT was completed to address R upper trap and lower cervical tightness and pain. STM was provided as above f/b TPDN. With TPDN, twitch responses were elicited and muscle tension was palpably decreased. Therex was then completed for muscle activation and for upper quarter and core strengthening. Pt tolerated an increase in demand for UE OH lifting. Pt tolerated PT today without adverse effects. Pt will continue to benefit from skilled PT to address impairments for improved function c minimized pain.  OBJECTIVE IMPAIRMENTS: decreased activity tolerance, decreased ROM, decreased strength, increased muscle spasms, and pain.   ACTIVITY LIMITATIONS: carrying, lifting, dressing, reach over head, and caring for others  PARTICIPATION LIMITATIONS: meal prep, cleaning, laundry, driving, occupation, and yard work  PERSONAL FACTORS: Time since onset of injury/illness/exacerbation are also affecting patient's functional outcome.   REHAB POTENTIAL: Excellent  CLINICAL DECISION MAKING: Stable/uncomplicated  EVALUATION COMPLEXITY: Low   GOALS:  SHORT TERM GOALS: Target date: 09/26/22  Pt will be Ind in an initial HEP  Baseline: started Goal status: MET 2.  Pt will voice understanding of measures to assist in pain reduction  Baseline: started 09/25/22: use of cold pack,  moist heat, knee bolster for sleep position Goal status: MET   LONG TERM GOALS: Target date: 11/07/22  Pt will be Ind in a final HEP to maintain achieved LOF  Baseline: started Goal status: INITIAL  2.  Increase R shoulder flexion and abd to 145d for appropriate upper body  function Baseline: 100d for both 10/07/22: Flex 130; abd 120 Goal status: Improved- ongoing  3.  Pt will report a decrease in pain to 0-3/10 with daily and work related activities form improved QOL and to return back to work Baseline: 3-8/10 Goal status: Improving gradually per pt's subjective report  4.  Increase R shoulder strength to 4+/5 or greater for improved R UE function for return to work  Baseline: See flow sheets Goal status: Ongoing  5.  Pt will be able to handle 15#, lifting from waist to shoulder height 2 set of 10 each Baseline: not tested 10/14/22: 3# and 5# waist to shoulder height x10 each Goal status: Improved- Ongoing  6.  Pt will be able to handle 75#, lifting from floor to waist height  2 sets of 10 Baseline: not tested 10/17/22: 10# hinged hip lifting Goal status: Ongoing  7.  Pt's FOTO score will improved to the predicted value of 57% as indication of improved function  Baseline: 31% 10/09/22: 38%  Goal status: Ongoing  PLAN:  PT FREQUENCY: 2x/week  PT DURATION: 6 weeks  PLANNED INTERVENTIONS: Therapeutic exercises, Therapeutic activity, Patient/Family education, Self Care, Joint mobilization, Dry Needling, Electrical stimulation, Cryotherapy, Moist heat, Taping, Ultrasound, Ionotophoresis 4mg /ml Dexamethasone, Manual therapy, and Re-evaluation.  PLAN FOR NEXT SESSION: Review FOTO; assess response to HEP; progress therex as indicated; use of modalities, manual therapy; and TPDN as indicated.  Ronin Crager MS, PT 10/21/22 12:52 PM

## 2022-10-21 ENCOUNTER — Ambulatory Visit: Payer: BC Managed Care – PPO

## 2022-10-21 DIAGNOSIS — M546 Pain in thoracic spine: Secondary | ICD-10-CM | POA: Diagnosis not present

## 2022-10-21 DIAGNOSIS — R252 Cramp and spasm: Secondary | ICD-10-CM

## 2022-10-21 NOTE — Patient Instructions (Signed)

## 2022-10-22 NOTE — Therapy (Signed)
OUTPATIENT PHYSICAL THERAPY THORACOLUMBAR TREATMENT NOTE   Patient Name: Kristen [name] MRN: [id number] DOB:Jun 23, 1980, 42 y.o., female Today's Date: 10/23/2022  END OF SESSION:  PT End of Session - 10/23/22 1112     Visit Number 12    Number of Visits 17    Date for PT Re-Evaluation 11/07/22    Authorization Type [employer]   Approved 4 visits 10/20/22-11/18/22    Authorization - Visit Number 2    Authorization - Number of Visits 4    PT Start Time 1107    PT Stop Time 1147    PT Time Calculation (min) 40 min    Activity Tolerance Patient tolerated treatment well    Behavior During Therapy WFL for tasks assessed/performed                      Past Medical History:  Diagnosis Date   Alpha thalassemia (HCC)    Asthma    uses inhaler daily/as needed   Complication of anesthesia    reaction to medication in epidural -morphine   Endometriosis    Preterm labor with preterm delivery    Seasonal allergies    Tubular adenoma of colon    Past Surgical History:  Procedure Laterality Date   CESAREAN SECTION     CESAREAN SECTION N/A 10/29/2015   Procedure: CESAREAN SECTION;  Surgeon: Adam Phenix, MD;  Location: [facility];  Service: Obstetrics;  Laterality: N/A;   DILATION AND CURETTAGE OF UTERUS     WISDOM TOOTH EXTRACTION  2014   Patient Active Problem List   Diagnosis Date Noted   Sprain of abdominal wall 09/23/2022   Vitamin D deficiency 07/10/2022   Microcytosis 07/10/2022   Alpha thalassemia (HCC) 07/10/2022   Obesity (BMI 30.0-34.9) 07/09/2022   Hypnagogic hallucinations 10/22/2016   Gestational diabetes mellitus (GDM) affecting fifth pregnancy 10/22/2016   Unwanted fertility 09/17/2016   Bowel trouble 02/07/2016   Slow transit constipation 02/07/2016    PCP: Philip Aspen, Limmie Patricia, MD   REFERRING PROVIDER: Philip Aspen, Limmie Patricia, MD   REFERRING DIAG: [phone] (ICD-10-CM) - Strain of abdominal wall, initial  encounter   Rationale for Evaluation and Treatment: Rehabilitation  THERAPY DIAG:  Pain in thoracic spine  Cramp and spasm  ONSET DATE: June 14th, 2024  SUBJECTIVE:                                                                                                                                                                                           SUBJECTIVE STATEMENT: TPDN really helped my neck. I've was doing more yesterday and my R  side has been hurting more. 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 Pt finds if she keeps moving the stiffness and soreness improves.  EVAL: Pt reports she strained her R ant/lat chest/abdominal area when lifting a package at [employer]. Pt notes the pain can radiate to her R back and her R upper shoulder hurts as well. At the time of the injury, the pt states the pain was intense and she felt like she was going to throw up. Pt indicates there was an area of swelling which ran along her ant/lat chest from under her R breast to her last rib. Following the injury, she worked on [employer] duty (small sort) for 1 week. Currently she is out of work on [employer]. She can return to work on light duty when she can tolerate lifting 15 lbs for 3.5 hours. In addition to pain, pt reports muscle spasms in the area. Pt has 6 children.  Pt is Rt handed  PERTINENT HISTORY:  NA  PAIN:  Are you having pain? Yes: NPRS scale:R neck and shoulder 1/10; R abdomin/side 6/10 Pain location: R ant/lat chest/abdominal area, radiates to R back, R upper shoulder Pain description: sharp, ache , throb, burning, spasms Aggravating factors: Driving Relieving factors: Cold pack, hot bath soaks Pain range on eval: 3-8/10  PRECAUTIONS: None  WEIGHT BEARING RESTRICTIONS: No  FALLS:  Has patient fallen in last 6 months? 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 Pt's need to be able to lift 75# to return to full duty  PLOF: Independent  PATIENT GOALS: Pain relief and to return to work  OBJECTIVE:   DIAGNOSTIC FINDINGS:  NA  PATIENT SURVEYS:  FOTO: Perceived function   31%, predicted   57%  FOTO 38 10/09/22  SCREENING FOR RED FLAGS: Bowel or bladder incontinence:   COGNITION: Overall cognitive status: Within functional limits for tasks assessed     SENSATION: WFL  MUSCLE LENGTH: Hamstrings: Right NT deg; Left NT deg Maisie Fus test: Right NT deg; Left NT deg  POSTURE: rounded shoulders and forward head  PALPATION: TTP to the ant/lat R rib cage from the 5th to 12th ribs and of the R upper trap  UE MMT:  MMT Right 09/04/22 Left 09/04/22  Shoulder flexion 4p 5  Shoulder extension 4p 5  Shoulder abduction 4p 5  Shoulder adduction 4p 5  Shoulder extension 4p 5  Shoulder internal rotation 4p 5  Shoulder external rotation 4p 5  Elbow flexion 5 5  Elbow extension 5 5  Wrist flexion    Wrist extension    Wrist ulnar deviation    Wrist radial deviation    Wrist pronation    Wrist supination     (Blank rows = not tested) P=denotes concordant pain  UE ROM:  AROM Right 09/04/22 Left 09/04/22 RT 10/07/22  Shoulder flexion 100 150 130  Shoulder extension     Shoulder abduction 100 150 120  Shoulder adduction     Shoulder extension     Shoulder internal rotation     Shoulder external rotation     Middle trapezius     Lower trapezius     Elbow flexion     Elbow extension     Wrist flexion     Wrist extension     Wrist ulnar deviation  Wrist radial deviation     Wrist pronation     Wrist supination     Grip strength      (Blank rows = not tested)  TODAY'S TREATMENT: [facility] Adult PT Treatment:                                                DATE: 10/23/22 Therapeutic Exercise: UBE L1 in each directionR lateral side  stretch L doorway R lateral massage with soft roller on wall Dead lifts 2x5 20# Forward flexed shoulder rows 2x10 10# each Cybex hip abd 2x10 25# each Cybex leg extension 2x10 70#  [facility] Adult PT Treatment:                                                DATE: 10/21/22 Therapeutic Exercise: UBE L1 in each direction Waist to shoulder height lifting R 6# 2x10; L #8 2x10 R Upper trap stretch and L and R cervical rotation x2 each 15" Trunk rotation 2RTBs 2x10 Manual Therapy: STM to the R upper trap and lower cervical paraspinals  Skilled palpation to identify TrPs and taut muscle bands Trigger Point Dry Needling Treatment: Pre-treatment instruction: Patient instructed on dry needling rationale, procedures, and possible side effects including pain during treatment (achy,cramping feeling), bruising, drop of blood, lightheadedness, nausea, sweating. Patient Consent Given: Yes Education handout provided: Yes Muscles treated: R upper trap, R lower cervical multifidi and paraspinals Needle size and number: .30x65mm x 2 Electrical stimulation performed: No Parameters: N/A Treatment response/outcome: Twitch response elicited and Palpable decrease in muscle tension Post-treatment instructions: Patient instructed to expect possible mild to moderate muscle soreness later today and/or tomorrow. Patient instructed in methods to reduce muscle soreness and to continue prescribed HEP. If patient was dry needled over the lung field, patient was instructed on signs and symptoms of pneumothorax and, however unlikely, to see immediate medical attention should they occur. Patient was also educated on signs and symptoms of infection and to seek medical attention should they occur. Patient verbalized understanding of these instructions and education.    [facility] Adult PT Treatment:                                                DATE: 10/17/22 Therapeutic Exercise: Nustep L5 Ue/LE Pectoral doorway stretch x2  30" R lateral side doorway stretch x2 30" Lat pull c GTB 2x10 Tricep pull downs c GTB 2x10 Palloff side steps with press double RTB x10 each Hinge hip lifting 2x10 c 10# box Self Care: Instruction in proper technique for hinged hip lifting                                 PATIENT EDUCATION:  Education details: Eval findings, POC, HEP, self care  Person educated: Patient Education method: Explanation, Demonstration, Tactile cues, Verbal cues, and Handouts Education comprehension: verbalized understanding, returned demonstration, verbal cues required, tactile cues required, and needs further education  HOME EXERCISE PROGRAM: Access Code: [email or web address] URL: [email or web address] Date: 09/18/2022 Prepared by: Freida Busman  Dacia Capers  Exercises - Standing 'L' Stretch at Counter  - 2 x daily - 7 x weekly - 1 sets - 10 reps - 5-20 hold - Shoulder Flexion Wall Slide with Towel  - 2 x daily - 7 x weekly - 1 sets - 10 reps - 5=10 hold - Supine Posterior Pelvic Tilt  - 2 x daily - 7 x weekly - 1 sets - 10 reps - 3 hold - Supine Shoulder Flexion Extension AAROM with Dowel  - 2 x daily - 7 x weekly - 1 sets - 10 reps - Standing Anti-Rotation Press with Anchored Resistance  - 2 x daily - 7 x weekly - 1 sets - 10 reps - 3 hold - Seated Upper Trapezius Stretch  - 1 x daily - 7 x weekly - 1 sets - 3 reps - 15-30 hold - Gentle Levator Scapulae Stretch  - 1 x daily - 7 x weekly - 1 sets - 3 reps - 15-30 hold - Hooklying Rib Cage Breathing  - 2 x daily - 7 x weekly - 2 sets - 5 reps - 5 hold - Supine March  - 2 x daily - 7 x weekly - 3 sets - 10 reps - Hooklying Isometric Hip Flexion  - 1 x daily - 7 x weekly - 2 sets - 5 reps - 5 hold - Hooklying Isometric Hip Flexion with Opposite Arm  - 1 x daily - 7 x weekly - 2 sets - 5 reps - 5 hold  ASSESSMENT:  CLINICAL IMPRESSION: Pt's R neck and upper shoulder pain responded positively to the TPDN. Pt was continued for upper and lower body, and core  strengthening. PT continues to gradually progress the physical demand. Lifting was increased from 10 to 20#. Pt's R side pain is at a moderate level, but was not increased with the prescribed therex. Pt is making appropriate progress.  OBJECTIVE IMPAIRMENTS: decreased activity tolerance, decreased ROM, decreased strength, increased muscle spasms, and pain.   ACTIVITY LIMITATIONS: carrying, lifting, dressing, reach over head, and caring for others  PARTICIPATION LIMITATIONS: meal prep, cleaning, laundry, driving, occupation, and yard work  PERSONAL FACTORS: Time since onset of injury/illness/exacerbation are also affecting patient's functional outcome.   REHAB POTENTIAL: Excellent  CLINICAL DECISION MAKING: Stable/uncomplicated  EVALUATION COMPLEXITY: Low   GOALS:  SHORT TERM GOALS: Target date: 09/26/22  Pt will be Ind in an initial HEP  Baseline: started Goal status: MET 2.  Pt will voice understanding of measures to assist in pain reduction  Baseline: started 09/25/22: use of cold pack, moist heat, knee bolster for sleep position Goal status: MET   LONG TERM GOALS: Target date: 11/07/22  Pt will be Ind in a final HEP to maintain achieved LOF  Baseline: started Goal status: INITIAL  2.  Increase R shoulder flexion and abd to 145d for appropriate upper body function Baseline: 100d for both 10/07/22: Flex 130; abd 120 Goal status: Improved- ongoing  3.  Pt will report a decrease in pain to 0-3/10 with daily and work related activities form improved QOL and to return back to work Baseline: 3-8/10 Goal status: Improving gradually per pt's subjective report  4.  Increase R shoulder strength to 4+/5 or greater for improved R UE function for return to work  Baseline: See flow sheets Goal status: Ongoing  5.  Pt will be able to handle 15#, lifting from waist to shoulder height 2 set of 10 each Baseline: not tested 10/14/22: 3# and 5# waist  to shoulder height x10 each Goal status:  Improved- Ongoing  6.  Pt will be able to handle 75#, lifting from floor to waist height  2 sets of 10 Baseline: not tested 10/17/22: 10# hinged hip lifting Goal status: Ongoing  7.  Pt's FOTO score will improved to the predicted value of 57% as indication of improved function  Baseline: 31% 10/09/22: 38%  Goal status: Ongoing  PLAN:  PT FREQUENCY: 2x/week  PT DURATION: 6 weeks  PLANNED INTERVENTIONS: Therapeutic exercises, Therapeutic activity, Patient/Family education, Self Care, Joint mobilization, Dry Needling, Electrical stimulation, Cryotherapy, Moist heat, Taping, Ultrasound, Ionotophoresis 4mg /ml Dexamethasone, Manual therapy, and Re-evaluation.  PLAN FOR NEXT SESSION: Review FOTO; assess response to HEP; progress therex as indicated; use of modalities, manual therapy; and TPDN as indicated.  Ashutosh Dieguez MS, PT 10/23/22 9:54 PM

## 2022-10-23 ENCOUNTER — Ambulatory Visit: Payer: BC Managed Care – PPO

## 2022-10-23 DIAGNOSIS — M546 Pain in thoracic spine: Secondary | ICD-10-CM

## 2022-10-23 DIAGNOSIS — R252 Cramp and spasm: Secondary | ICD-10-CM

## 2022-10-28 ENCOUNTER — Ambulatory Visit (HOSPITAL_COMMUNITY)
Admission: RE | Admit: 2022-10-28 | Discharge: 2022-10-28 | Disposition: A | Discharge status: Home / Self Care | Payer: BC Managed Care – PPO | Source: Ambulatory Visit | Attending: Family Medicine | Admitting: Family Medicine

## 2022-10-28 ENCOUNTER — Encounter (HOSPITAL_COMMUNITY): Payer: Self-pay

## 2022-10-28 VITALS — BP 119/83 | HR 101 | Temp 99.3°F | Resp 20

## 2022-10-28 DIAGNOSIS — J020 Streptococcal pharyngitis: Secondary | ICD-10-CM | POA: Diagnosis not present

## 2022-10-28 LAB — POCT RAPID STREP A (OFFICE): Rapid Strep A Screen: POSITIVE — AB

## 2022-10-28 MED ORDER — AZITHROMYCIN 250 MG PO TABS: 250.0000 mg | ORAL_TABLET | Freq: Every day | ORAL | 0 refills | Status: DC

## 2022-10-28 NOTE — ED Triage Notes (Signed)
Pt states that she started over night with ear and throat pain on Sunday. She has been having a fever of 102 at home, body aches. She has been taking cold and flu meds. She has been using her albuterol MDI She states she coughs and can't stop, her mucous is tinted light pink.

## 2022-10-28 NOTE — ED Provider Notes (Signed)
[facility]   [id number] 10/28/22 Arrival Time: 1221  ASSESSMENT & PLAN:  1. Strep pharyngitis    No signs of peritonsillar abscess. Discussed. PCN allergic. Begin: Meds ordered this encounter  Medications   azithromycin (ZITHROMAX) 250 MG tablet    Sig: Take 1 tablet (250 mg total) by mouth daily. Take first 2 tablets together, then 1 every day until finished.    Dispense:  6 tablet    Refill:  0    Results for orders placed or performed during the hospital encounter of 10/28/22  POC rapid strep A  Result Value Ref Range   Rapid Strep A Screen Positive (A) Negative   Labs Reviewed  POCT RAPID STREP A (OFFICE) - Abnormal; Notable for the following components:      Result Value   Rapid Strep A Screen Positive (*)    All other components within normal limits    OTC analgesics and throat care as needed  Instructed to finish full course of antibiotics. Will follow up if not showing significant improvement over the next 24-48 hours.    Discharge Instructions      You may use over the counter ibuprofen or acetaminophen as needed.  For a sore throat, over the counter products such as [employer] Peroxyl Mouth Sore Rinse or Chloraseptic Sore Throat Spray may provide some temporary relief Finish all of the antibiotic prescribed.      Reviewed expectations re: course of current medical issues. Questions answered. Outlined signs and symptoms indicating need for more acute intervention. Patient verbalized understanding. After Visit Summary given.   SUBJECTIVE:  Kristen [name] is a 42 y.o. female who reports a sore throat. Pt states that she started over night with ear and throat pain on Sunday. She has been having a fever of 102 at home, body aches. She has been taking cold and flu meds. She has been using her albuterol MDI She states she coughs and can't stop, her mucous is tinted light pink.    OBJECTIVE:  Vitals:   10/28/22 1233  BP: 119/83  Pulse:  (!) 101  Resp: 20  Temp: 99.3 F (37.4 C)  TempSrc: Oral  SpO2: 96%    Slight tachycardia noted.  General appearance: alert; no distress HEENT: throat with marked erythema and with exudative tonsillar hypertrophy; uvula is midline Neck: supple with FROM; small bilat cervical LAD Lungs: speaks full sentences without difficulty; unlabored Abd: soft; non-tender Skin: reveals no rash; warm and dry Psychological: alert and cooperative; normal mood and affect  Allergies  Allergen Reactions   Hydrocodone Shortness Of Breath   Morphine And Codeine Shortness Of Breath and Itching    Burning   Penicillins Hives    Has patient had a PCN reaction causing immediate rash, facial/tongue/throat swelling, SOB or lightheadedness with hypotension: Yes Has patient had a PCN reaction causing severe rash involving mucus membranes or skin necrosis: Yes Has patient had a PCN reaction that required hospitalization: No Has patient had a PCN reaction occurring within the last 10 years: Yes If all of the above answers are "NO", then may proceed with Cephalosporin use.    Percocet [Oxycodone-Acetaminophen] Anaphylaxis    Pt has taken hydromorphone without complications in the past    Past Medical History:  Diagnosis Date   Alpha thalassemia (HCC)    Asthma    uses inhaler daily/as needed   Complication of anesthesia    reaction to medication in epidural -morphine   Endometriosis    Preterm labor with  preterm delivery    Seasonal allergies    Tubular adenoma of colon    Social History   Socioeconomic History   Marital status: Married    Spouse name: Not on file   Number of children: 5   Years of education: Not on file   Highest education level: Not on file  Occupational History   Occupation: Doula  Tobacco Use   Smoking status: Former    Types: Cigars    Passive exposure: Never   Smokeless tobacco: Never   Tobacco comments:    occ  Vaping Use   Vaping status: Never Used  Substance  and Sexual Activity   Alcohol use: No    Alcohol/week: 0.0 standard drinks of alcohol   Drug use: No   Sexual activity: Not Currently    Partners: Male  Other Topics Concern   Not on file  Social History Narrative   Not on file   Social Determinants of Health   Financial Resource Strain: Not on file  Food Insecurity: Not on file  Transportation Needs: Not on file  Physical Activity: Not on file  Stress: Not on file  Social Connections: Not on file  Intimate Partner Violence: Not on file   Family History  Problem Relation Age of Onset   HIV Father    Colon cancer Maternal Uncle    Diabetes Maternal Grandmother    Diabetes Paternal Grandmother    Stomach cancer Neg Hx    Rectal cancer Neg Hx    Esophageal cancer Neg Hx    Liver cancer Neg Hx    Colon polyps Neg Hx            Mardella Layman, MD 10/28/22 1651

## 2022-10-28 NOTE — Discharge Instructions (Addendum)
You may use over the counter ibuprofen or acetaminophen as needed.  For a sore throat, over the counter products such as Colgate Peroxyl Mouth Sore Rinse or Chloraseptic Sore Throat Spray may provide some temporary relief Finish all of the antibiotic prescribed.

## 2022-10-31 ENCOUNTER — Ambulatory Visit: Payer: BC Managed Care – PPO

## 2022-11-03 ENCOUNTER — Encounter: Payer: Self-pay | Admitting: Physical Therapy

## 2022-11-03 ENCOUNTER — Ambulatory Visit: Payer: BC Managed Care – PPO | Attending: Internal Medicine | Admitting: Physical Therapy

## 2022-11-03 DIAGNOSIS — R252 Cramp and spasm: Secondary | ICD-10-CM | POA: Insufficient documentation

## 2022-11-03 DIAGNOSIS — M546 Pain in thoracic spine: Secondary | ICD-10-CM | POA: Diagnosis not present

## 2022-11-03 NOTE — Therapy (Signed)
OUTPATIENT PHYSICAL THERAPY THORACOLUMBAR TREATMENT NOTE   Patient Name: Kristen [name] MRN: [id number] DOB:[birth year], 42 y.o., female Today's Date: 11/03/2022  END OF SESSION:  PT End of Session - 11/03/22 1107     Visit Number 13    Number of Visits 17    Date for PT Re-Evaluation 11/07/22    Authorization Type [employer]   Approved 4 visits 10/20/22-11/18/22    Authorization - Visit Number 3    Authorization - Number of Visits 4    PT Start Time 1107    PT Stop Time 1145    PT Time Calculation (min) 38 min                      Past Medical History:  Diagnosis Date   Alpha thalassemia (HCC)    Asthma    uses inhaler daily/as needed   Complication of anesthesia    reaction to medication in epidural -morphine   Endometriosis    Preterm labor with preterm delivery    Seasonal allergies    Tubular adenoma of colon    Past Surgical History:  Procedure Laterality Date   CESAREAN SECTION     CESAREAN SECTION N/A 10/29/2015   Procedure: CESAREAN SECTION;  Surgeon: Adam Phenix, MD;  Location: [facility] BIRTHING SUITES;  Service: Obstetrics;  Laterality: N/A;   DILATION AND CURETTAGE OF UTERUS     WISDOM TOOTH EXTRACTION  2014   Patient Active Problem List   Diagnosis Date Noted   Sprain of abdominal wall 09/23/2022   Vitamin D deficiency 07/10/2022   Microcytosis 07/10/2022   Alpha thalassemia (HCC) 07/10/2022   Obesity (BMI 30.0-34.9) 07/09/2022   Hypnagogic hallucinations 10/22/2016   Gestational diabetes mellitus (GDM) affecting fifth pregnancy 10/22/2016   Unwanted fertility 09/17/2016   Bowel trouble 02/07/2016   Slow transit constipation 02/07/2016    PCP: Philip Aspen, Limmie Patricia, MD   REFERRING PROVIDER: Philip Aspen, Limmie Patricia, MD   REFERRING DIAG: [phone] (ICD-10-CM) - Strain of abdominal wall, initial encounter   Rationale for Evaluation and Treatment: Rehabilitation  THERAPY DIAG:  Pain in thoracic spine  Cramp  and spasm  ONSET DATE: June 14th, 2024  SUBJECTIVE:                                                                                                                                                                                           SUBJECTIVE STATEMENT: I am feeling lightheaded this morning. I had strep throat last week and it felt like I had the flu. Still having some body aches and the neck and shoulder hurt worse  when I was sick. I can carry heavier stuff now. The right side pain has localized to a smaller area.   EVAL: Pt reports she strained her R ant/lat chest/abdominal area when lifting a package at [employer]. Pt notes the pain can radiate to her R back and her R upper shoulder hurts as well. At the time of the injury, the pt states the pain was intense and she felt like she was going to throw up. Pt indicates there was an area of swelling which ran along her ant/lat chest from under her R breast to her last rib. Following the injury, she worked on [employer] duty (small sort) for 1 week. Currently she is out of work on [employer]. She can return to work on light duty when she can tolerate lifting 15 lbs for 3.5 hours. In addition to pain, pt reports muscle spasms in the area. Pt has 6 children.  Pt is Rt handed  PERTINENT HISTORY:  NA  PAIN:  Are you having pain? Yes: NPRS scale:R neck and shoulder 6-7/10; R abdomin/side 5/10, back 3/10.  Pain location: R ant/lat chest/abdominal area, radiates to R back, R upper shoulder Pain description: sharp, ache , throb, burning, spasms Aggravating factors: Driving Relieving factors: Cold pack, hot bath soaks Pain range on eval: 3-8/10  PRECAUTIONS: None  WEIGHT BEARING RESTRICTIONS: No  FALLS:  Has patient fallen in last 6 months? 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 Pt's need to be able to lift 75# to return to full duty  PLOF: Independent  PATIENT GOALS: Pain relief and to return to work  OBJECTIVE:   DIAGNOSTIC FINDINGS:  NA  PATIENT SURVEYS:  FOTO: Perceived function   31%, predicted   57%  FOTO 38 10/09/22  SCREENING FOR RED FLAGS: Bowel or bladder incontinence:   COGNITION: Overall cognitive status: Within functional limits for tasks assessed     SENSATION: WFL  MUSCLE LENGTH: Hamstrings: Right NT deg; Left NT deg Maisie Fus test: Right NT deg; Left NT deg  POSTURE: rounded shoulders and forward head  PALPATION: TTP to the ant/lat R rib cage from the 5th to 12th ribs and of the R upper trap  UE MMT:  MMT Right 09/04/22 Left 09/04/22 Right  11/03/22  Shoulder flexion 4p 5 4  Shoulder extension 4p 5   Shoulder abduction 4p 5 4  Shoulder adduction 4p 5   Shoulder extension 4p 5   Shoulder internal rotation 4p 5 5  Shoulder external rotation 4p 5 4+ p in right side  Elbow flexion 5 5   Elbow extension 5 5   Wrist flexion     Wrist extension     Wrist ulnar deviation     Wrist radial deviation     Wrist pronation     Wrist supination      (Blank rows = not tested) P=denotes concordant pain  UE ROM:  AROM Right 09/04/22 Left 09/04/22 RT 10/07/22 RT 11/03/22  Shoulder flexion 100 150 130 128  Shoulder extension      Shoulder abduction 100 150 120 120  Shoulder adduction      Shoulder extension      Shoulder internal rotation      Shoulder external rotation      Middle trapezius      Lower trapezius  Elbow flexion      Elbow extension      Wrist flexion      Wrist extension      Wrist ulnar deviation      Wrist radial deviation      Wrist pronation      Wrist supination      Grip strength       (Blank rows = not tested)  TODAY'S TREATMENT: [facility] Adult PT Treatment:                                                DATE: 11/03/22 Therapeutic Exercise: Pulleys Cybex hip abd 2x10 25#  each  Therapeutic Activity: Waist to shoulder height lifting R 6# 1x10; L #8 1x10 (staggered stand)  Waist to shoulder height lifting Bilat holding 15# dumb bell 1 x 10 (small staggered stance)  Dead lifts 2x5 25#    [facility] Adult PT Treatment:                                                DATE: 10/23/22 Therapeutic Exercise: UBE L1 in each directionR lateral side stretch L doorway R lateral massage with soft roller on wall Dead lifts 2x5 20# Forward flexed shoulder rows 2x10 10# each Cybex hip abd 2x10 25# each Cybex leg extension 2x10 70#  [facility] Adult PT Treatment:                                                DATE: 10/21/22 Therapeutic Exercise: UBE L1 in each direction Waist to shoulder height lifting R 6# 2x10; L #8 2x10 R Upper trap stretch and L and R cervical rotation x2 each 15" Trunk rotation 2RTBs 2x10 Manual Therapy: STM to the R upper trap and lower cervical paraspinals  Skilled palpation to identify TrPs and taut muscle bands Trigger Point Dry Needling Treatment: Pre-treatment instruction: Patient instructed on dry needling rationale, procedures, and possible side effects including pain during treatment (achy,cramping feeling), bruising, drop of blood, lightheadedness, nausea, sweating. Patient Consent Given: Yes Education handout provided: Yes Muscles treated: R upper trap, R lower cervical multifidi and paraspinals Needle size and number: .30x3mm x 2 Electrical stimulation performed: No Parameters: N/A Treatment response/outcome: Twitch response elicited and Palpable decrease in muscle tension Post-treatment instructions: Patient instructed to expect possible mild to moderate muscle soreness later today and/or tomorrow. Patient instructed in methods to reduce muscle soreness and to continue prescribed HEP. If patient was dry needled over the lung field, patient was instructed on signs and symptoms of pneumothorax and, however unlikely, to see immediate  medical attention should they occur. Patient was also educated on signs and symptoms of infection and to seek medical attention should they occur. Patient verbalized understanding of these instructions and education.    [facility] Adult PT Treatment:                                                DATE:  10/17/22 Therapeutic Exercise: Nustep L5 Ue/LE Pectoral doorway stretch x2 30" R lateral side doorway stretch x2 30" Lat pull c GTB 2x10 Tricep pull downs c GTB 2x10 Palloff side steps with press double RTB x10 each Hinge hip lifting 2x10 c 10# box Self Care: Instruction in proper technique for hinged hip lifting                                 PATIENT EDUCATION:  Education details: Eval findings, POC, HEP, self care  Person educated: Patient Education method: Explanation, Demonstration, Tactile cues, Verbal cues, and Handouts Education comprehension: verbalized understanding, returned demonstration, verbal cues required, tactile cues required, and needs further education  HOME EXERCISE PROGRAM: Access Code: [email or web address] URL: [email or web address] Date: 09/18/2022 Prepared by: Joellyn Rued  Exercises - Standing 'L' Stretch at Counter  - 2 x daily - 7 x weekly - 1 sets - 10 reps - 5-20 hold - Shoulder Flexion Wall Slide with Towel  - 2 x daily - 7 x weekly - 1 sets - 10 reps - 5=10 hold - Supine Posterior Pelvic Tilt  - 2 x daily - 7 x weekly - 1 sets - 10 reps - 3 hold - Supine Shoulder Flexion Extension AAROM with Dowel  - 2 x daily - 7 x weekly - 1 sets - 10 reps - Standing Anti-Rotation Press with Anchored Resistance  - 2 x daily - 7 x weekly - 1 sets - 10 reps - 3 hold - Seated Upper Trapezius Stretch  - 1 x daily - 7 x weekly - 1 sets - 3 reps - 15-30 hold - Gentle Levator Scapulae Stretch  - 1 x daily - 7 x weekly - 1 sets - 3 reps - 15-30 hold - Hooklying Rib Cage Breathing  - 2 x daily - 7 x weekly - 2 sets - 5 reps - 5 hold - Supine March  - 2 x daily - 7 x weekly - 3  sets - 10 reps - Hooklying Isometric Hip Flexion  - 1 x daily - 7 x weekly - 2 sets - 5 reps - 5 hold - Hooklying Isometric Hip Flexion with Opposite Arm  - 1 x daily - 7 x weekly - 2 sets - 5 reps - 5 hold  ASSESSMENT:  CLINICAL IMPRESSION: Pt reports increased body aches and pain with being sick last week. She reports increased pain levels in neck, shoulder, right side and back. She does report improved strength noting that she can carry more weight. She has been working on cabinet reaching with a  5# weight at home. Her MMT for shoulder has improved. Continued with lifting progression. She reports "a little pain" with lifting to shoulder height. PT continues to gradually progress the physical demand. Dead lifting Lifting was increased from 20 to 25# and shoulder lifting was progressed to 15# using both hands. Next visit will need to check goals and plan for extension if indicated.   OBJECTIVE IMPAIRMENTS: decreased activity tolerance, decreased ROM, decreased strength, increased muscle spasms, and pain.   ACTIVITY LIMITATIONS: carrying, lifting, dressing, reach over head, and caring for others  PARTICIPATION LIMITATIONS: meal prep, cleaning, laundry, driving, occupation, and yard work  PERSONAL FACTORS: Time since onset of injury/illness/exacerbation are also affecting patient's functional outcome.   REHAB POTENTIAL: Excellent  CLINICAL DECISION MAKING: Stable/uncomplicated  EVALUATION COMPLEXITY: Low   GOALS:  SHORT TERM GOALS: Target date: 09/26/22  Pt  will be Ind in an initial HEP  Baseline: started Goal status: MET 2.  Pt will voice understanding of measures to assist in pain reduction  Baseline: started 09/25/22: use of cold pack, moist heat, knee bolster for sleep position Goal status: MET   LONG TERM GOALS: Target date: 11/07/22  Pt will be Ind in a final HEP to maintain achieved LOF  Baseline: started Goal status: ONGOING  2.  Increase R shoulder flexion and abd to  145d for appropriate upper body function Baseline: 100d for both 10/07/22: Flex 130; abd 120 Goal status: Improved- ongoing  3.  Pt will report a decrease in pain to 0-3/10 with daily and work related activities form improved QOL and to return back to work Baseline: 3-8/10 Goal status: Improving gradually per pt's subjective report  4.  Increase R shoulder strength to 4+/5 or greater for improved R UE function for return to work  Baseline: See flow sheets 11/03/22: see chart, partially met Goal status: Ongoing  5.  Pt will be able to handle 15#, lifting from waist to shoulder height 2 set of 10 each Baseline: not tested 10/14/22: 3# and 5# waist to shoulder height x10 each 11/03/22: can lift 15# dumb bell using both UE from counter to should height shelf 1 x 10 Goal status: Improved- Ongoing  6.  Pt will be able to handle 75#, lifting from floor to waist height  2 sets of 10 Baseline: not tested 10/17/22: 10# hinged hip lifting 11/03/22: 25 # hinged hip lifting  Goal status: Ongoing  7.  Pt's FOTO score will improved to the predicted value of 57% as indication of improved function  Baseline: 31% 10/09/22: 38%  Goal status: Ongoing  PLAN:  PT FREQUENCY: 2x/week  PT DURATION: 6 weeks  PLANNED INTERVENTIONS: Therapeutic exercises, Therapeutic activity, Patient/Family education, Self Care, Joint mobilization, Dry Needling, Electrical stimulation, Cryotherapy, Moist heat, Taping, Ultrasound, Ionotophoresis 4mg /ml Dexamethasone, Manual therapy, and Re-evaluation.  PLAN FOR NEXT SESSION: Review FOTO; assess response to HEP; progress therex as indicated; use of modalities, manual therapy; and TPDN as indicated.  Jannette Spanner, PTA 11/03/22 11:48 AM Phone: [phone] Fax: [phone]

## 2022-11-12 NOTE — Therapy (Signed)
OUTPATIENT PHYSICAL THERAPY THORACOLUMBAR TREATMENT NOTE/Re-Cert/Re-Auth   Patient Name: Kristen [name] MRN: [id number] DOB:[birth year], 42 y.o., female Today's Date: 11/14/2022  END OF SESSION:  PT End of Session - 11/14/22 0938     Visit Number 14    Number of Visits 30    Date for PT Re-Evaluation 11/07/22    Authorization Type [employer]   Approved 4 visits 10/20/22-11/18/22    Authorization - Visit Number 4    Authorization - Number of Visits 4    PT Start Time 0935    PT Stop Time 1015    PT Time Calculation (min) 40 min    Activity Tolerance Patient tolerated treatment well    Behavior During Therapy WFL for tasks assessed/performed                       Past Medical History:  Diagnosis Date   Alpha thalassemia (HCC)    Asthma    uses inhaler daily/as needed   Complication of anesthesia    reaction to medication in epidural -morphine   Endometriosis    Preterm labor with preterm delivery    Seasonal allergies    Tubular adenoma of colon    Past Surgical History:  Procedure Laterality Date   CESAREAN SECTION     CESAREAN SECTION N/A 10/29/2015   Procedure: CESAREAN SECTION;  Surgeon: Adam Phenix, MD;  Location: [facility] BIRTHING SUITES;  Service: Obstetrics;  Laterality: N/A;   DILATION AND CURETTAGE OF UTERUS     WISDOM TOOTH EXTRACTION  2014   Patient Active Problem List   Diagnosis Date Noted   Sprain of abdominal wall 09/23/2022   Vitamin D deficiency 07/10/2022   Microcytosis 07/10/2022   Alpha thalassemia (HCC) 07/10/2022   Obesity (BMI 30.0-34.9) 07/09/2022   Hypnagogic hallucinations 10/22/2016   Gestational diabetes mellitus (GDM) affecting fifth pregnancy 10/22/2016   Unwanted fertility 09/17/2016   Bowel trouble 02/07/2016   Slow transit constipation 02/07/2016    PCP: Philip Aspen, Limmie Patricia, MD   REFERRING PROVIDER: Philip Aspen, Limmie Patricia, MD   REFERRING DIAG: [phone] (ICD-10-CM) - Strain of  abdominal wall, initial encounter   Rationale for Evaluation and Treatment: Rehabilitation  THERAPY DIAG:  Pain in thoracic spine  Cramp and spasm  ONSET DATE: June 14th, 2024  SUBJECTIVE:                                                                                                                                                                                           SUBJECTIVE STATEMENT: I've been doing exercises at home, stretches and strengthening. I'm limited to  working out with 5#. I have more range and less pain of my neck, but have a crunching sensation when I turn my neck. Sees Dr. Loreta Ave on 11/25/22. Overall, she feels like she is improving, but is still very limited with tolerance to activities.  EVAL: Pt reports she strained her R ant/lat chest/abdominal area when lifting a package at [employer]. Pt notes the pain can radiate to her R back and her R upper shoulder hurts as well. At the time of the injury, the pt states the pain was intense and she felt like she was going to throw up. Pt indicates there was an area of swelling which ran along her ant/lat chest from under her R breast to her last rib. Following the injury, she worked on [employer] duty (small sort) for 1 week. Currently she is out of work on [employer]. She can return to work on light duty when she can tolerate lifting 15 lbs for 3.5 hours. In addition to pain, pt reports muscle spasms in the area. Pt has 6 children.  Pt is Rt handed  PERTINENT HISTORY:  NA  PAIN:  Are you having pain? Yes: NPRS scale:R neck and shoulder 5/10; R abdomin/side 6/10, back 3/10.  Pain location: R ant/lat chest/abdominal area, radiates to R back, R upper shoulder Pain description: sharp, ache , throb, burning, spasms Aggravating factors: Driving Relieving factors: Cold pack, hot bath soaks Pain range on eval: 3-8/10  PRECAUTIONS: None  WEIGHT BEARING RESTRICTIONS: No  FALLS:  Has patient fallen in last 6 months? 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 Pt's need to be able to lift 75# to return to full duty  PLOF: Independent  PATIENT GOALS: Pain relief and to return to work  OBJECTIVE:   DIAGNOSTIC FINDINGS:  NA  PATIENT SURVEYS:  FOTO: Perceived function   31%, predicted   57%  FOTO 38% 10/09/22 FOTO 42% 11/14/22  SCREENING FOR RED FLAGS: Bowel or bladder incontinence:   COGNITION: Overall cognitive status: Within functional limits for tasks assessed     SENSATION: WFL  MUSCLE LENGTH: Hamstrings: Right NT deg; Left NT deg Maisie Fus test: Right NT deg; Left NT deg  POSTURE: rounded shoulders and forward head  PALPATION: TTP to the ant/lat R rib cage from the 5th to 12th ribs and of the R upper trap  UE MMT:  MMT Right 09/04/22 Left 09/04/22 Right  11/03/22 Rt 11/14/22  Shoulder flexion 4p 5 4 4+  Shoulder extension 4p 5    Shoulder abduction 4p 5 4 4+  Shoulder adduction 4p 5    Shoulder extension 4p 5    Shoulder internal rotation 4p 5 5   Shoulder external rotation 4p 5 4+ p in right side 4+ p rt side  Elbow flexion 5 5    Elbow extension 5 5    Wrist flexion      Wrist extension      Wrist ulnar deviation      Wrist radial deviation      Wrist pronation      Wrist supination       (Blank rows = not tested) P=denotes concordant pain  UE ROM:  AROM Right 09/04/22 Left 09/04/22 RT 10/07/22 RT 11/03/22 RT 11/14/22  Shoulder flexion 100 150 130 128 140d p  Shoulder extension  Shoulder abduction 100 150 120 120 128d p  Shoulder adduction       Shoulder extension       Shoulder internal rotation       Shoulder external rotation       Middle trapezius       Lower trapezius       Elbow flexion       Elbow extension       Wrist flexion       Wrist extension       Wrist ulnar deviation        Wrist radial deviation       Wrist pronation       Wrist supination       Grip strength        (Blank rows = not tested)    P=denotes concordant pain  TODAY'S TREATMENT: [facility] Adult PT Treatment:                                                DATE: 11/14/22 Therapeutic Exercise: Omega chest press 2x10 15# core engaged Omega chest pull 2x10 20# core engaged Omega lat pull 2x10 15# core engaged ROM MMT Therapeutic Activity: Waist to shoulder height lifting Bilat holding 15# dumb bell 1 x 10 (small staggered stance)  Dead lifts 2x5 25# Reassess FOTO and reviewed  [facility] Adult PT Treatment:                                                DATE: 11/03/22 Therapeutic Exercise: Pulleys Cybex hip abd 2x10 25# each  Therapeutic Activity: Waist to shoulder height lifting R 6# 1x10; L #8 1x10 (staggered stand)  Waist to shoulder height lifting Bilat holding 15# dumb bell 1 x 10 (small staggered stance)  Dead lifts 2x5 25#    [facility] Adult PT Treatment:                                                DATE: 10/23/22 Therapeutic Exercise: UBE L1 in each directionR lateral side stretch L doorway R lateral massage with soft roller on wall Dead lifts 2x5 20# Forward flexed shoulder rows 2x10 10# each Cybex hip abd 2x10 25# each Cybex leg extension 2x10 70#  [facility] Adult PT Treatment:                                                DATE: 10/21/22 Therapeutic Exercise: UBE L1 in each direction Waist to shoulder height lifting R 6# 2x10; L #8 2x10 R Upper trap stretch and L and R cervical rotation x2 each 15" Trunk rotation 2RTBs 2x10 Manual Therapy: STM to the R upper trap and lower cervical paraspinals  Skilled palpation to identify TrPs and taut muscle bands Trigger Point Dry Needling Treatment: Pre-treatment instruction: Patient instructed on dry needling rationale, procedures, and possible side effects including pain during treatment (achy,cramping feeling), bruising, drop of blood,  lightheadedness, nausea, sweating. Patient Consent  Given: Yes Education handout provided: Yes Muscles treated: R upper trap, R lower cervical multifidi and paraspinals Needle size and number: .30x72mm x 2 Electrical stimulation performed: No Parameters: N/A Treatment response/outcome: Twitch response elicited and Palpable decrease in muscle tension Post-treatment instructions: Patient instructed to expect possible mild to moderate muscle soreness later today and/or tomorrow. Patient instructed in methods to reduce muscle soreness and to continue prescribed HEP. If patient was dry needled over the lung field, patient was instructed on signs and symptoms of pneumothorax and, however unlikely, to see immediate medical attention should they occur. Patient was also educated on signs and symptoms of infection and to seek medical attention should they occur. Patient verbalized understanding of these instructions and education.                                   PATIENT EDUCATION:  Education details: Eval findings, POC, HEP, self care  Person educated: Patient Education method: Explanation, Demonstration, Tactile cues, Verbal cues, and Handouts Education comprehension: verbalized understanding, returned demonstration, verbal cues required, tactile cues required, and needs further education  HOME EXERCISE PROGRAM: Access Code: [email or web address] URL: [email or web address] Date: 09/18/2022 Prepared by: Joellyn Rued  Exercises - Standing 'L' Stretch at Counter  - 2 x daily - 7 x weekly - 1 sets - 10 reps - 5-20 hold - Shoulder Flexion Wall Slide with Towel  - 2 x daily - 7 x weekly - 1 sets - 10 reps - 5=10 hold - Supine Posterior Pelvic Tilt  - 2 x daily - 7 x weekly - 1 sets - 10 reps - 3 hold - Supine Shoulder Flexion Extension AAROM with Dowel  - 2 x daily - 7 x weekly - 1 sets - 10 reps - Standing Anti-Rotation Press with Anchored Resistance  - 2 x daily - 7 x weekly - 1 sets - 10 reps - 3  hold - Seated Upper Trapezius Stretch  - 1 x daily - 7 x weekly - 1 sets - 3 reps - 15-30 hold - Gentle Levator Scapulae Stretch  - 1 x daily - 7 x weekly - 1 sets - 3 reps - 15-30 hold - Hooklying Rib Cage Breathing  - 2 x daily - 7 x weekly - 2 sets - 5 reps - 5 hold - Supine March  - 2 x daily - 7 x weekly - 3 sets - 10 reps - Hooklying Isometric Hip Flexion  - 1 x daily - 7 x weekly - 2 sets - 5 reps - 5 hold - Hooklying Isometric Hip Flexion with Opposite Arm  - 1 x daily - 7 x weekly - 2 sets - 5 reps - 5 hold  ASSESSMENT:  CLINICAL IMPRESSION: Continued PT for progressive strengthening for return to function and return to work. Pt is making gradual gains regarding the physical demand she is able to tolerate, but what she is able to tolerated is much less the the demands of her occupation. Currently, Dead lifting Lifting have increased from 20 to 25# and shoulder lifting has progressed to 15# using both hands. Reassessed pt's FOTO and it has continued improve some, and reflects the degree of functional improvement the pt has been able to achieve to date. Additionally, AROM and strength of the R shoulder have both made good progress. Pt tolerated PT today without adverse effects. 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   OBJECTIVE IMPAIRMENTS: decreased activity tolerance, decreased ROM, decreased strength, increased muscle spasms, and pain.   ACTIVITY LIMITATIONS: carrying, lifting, dressing, reach over head, and caring for others  PARTICIPATION LIMITATIONS: meal prep, cleaning, laundry, driving, occupation, and yard work  PERSONAL FACTORS: Time since onset of injury/illness/exacerbation are also affecting patient's functional outcome.   REHAB POTENTIAL: Excellent  CLINICAL DECISION MAKING: Stable/uncomplicated  EVALUATION COMPLEXITY: Low   GOALS:  SHORT TERM GOALS: Target  date: 09/26/22  Pt will be Ind in an initial HEP  Baseline: started Goal status: MET 2.  Pt will voice understanding of measures to assist in pain reduction  Baseline: started 09/25/22: use of cold pack, moist heat, knee bolster for sleep position Goal status: MET   LONG TERM GOALS: Target date: 11/07/22  Pt will be Ind in a final HEP to maintain achieved LOF  Baseline: started Goal status: ONGOING  2.  Increase R shoulder flexion and abd to 145d for appropriate upper body function Baseline: 100d for both 10/07/22: Flex 130; abd 120 11/14/22: Flex 140; abd 128 Goal status: Improved- ongoing  3.  Pt will report a decrease in pain to 0-3/10 with daily and work related activities form improved QOL and to return back to work Baseline: 3-8/10 See pain scale above Goal status: Improving gradually per pt's subjective report  4.  Increase R shoulder strength to 4+/5 or greater for improved R UE function for return to work  Baseline: See flow sheets 11/03/22: see chart, partially me 11/14/22: see flow sheet Goal status: MET  5.  Pt will be able to handle 15#, lifting from waist to shoulder height 2 set of 10 each Baseline: not tested 10/14/22: 3# and 5# waist to shoulder height x10 each 11/03/22: can lift 15# dumb bell using both UE from counter to should height shelf 1 x 10 Goal status: Improved- Ongoing  6.  Pt will be able to handle 75#, lifting from floor to waist height  2 sets of 10 Baseline: not tested 10/17/22: 10# hinged hip lifting 11/03/22: 25 # hinged hip lifting  Goal status: Ongoing  7.  Pt's FOTO score will improved to the predicted value of 57% as indication of improved function  Baseline: 31% 10/09/22: 38%  11/18/22: Goal status: Ongoing  PLAN:  PT FREQUENCY: 2x/week  PT DURATION: 6 weeks  PLANNED INTERVENTIONS: Therapeutic exercises, Therapeutic activity, Patient/Family education, Self Care, Joint mobilization, Dry Needling, Electrical stimulation, Cryotherapy, Moist  heat, Taping, Ultrasound, Ionotophoresis 4mg /ml Dexamethasone, Manual therapy, and Re-evaluation.  PLAN FOR NEXT SESSION: Review FOTO; assess response to HEP; progress therex as indicated; use of modalities, manual therapy; and TPDN as indicated.  Jaina Morin MS, PT 11/14/22 11:57 AM  Check all possible CPT codes: 52841 - PT Re-evaluation, 97110- Therapeutic Exercise, 97140 - Manual Therapy, 97530 - Therapeutic Activities, 97535 - Self Care, [phone] - Electrical stimulation (Manual), [id number] - Ultrasound, and [id number] - Aquatic therapy    Check all conditions that are expected to impact treatment: {Conditions expected to impact treatment:Musculoskeletal disorders   If treatment provided at initial evaluation, no treatment charged due to lack of authorization.

## 2022-11-14 ENCOUNTER — Ambulatory Visit: Payer: BC Managed Care – PPO

## 2022-11-14 DIAGNOSIS — M546 Pain in thoracic spine: Secondary | ICD-10-CM

## 2022-11-14 DIAGNOSIS — R252 Cramp and spasm: Secondary | ICD-10-CM

## 2022-11-25 ENCOUNTER — Ambulatory Visit: Payer: BC Managed Care – PPO | Admitting: Internal Medicine

## 2022-11-27 ENCOUNTER — Ambulatory Visit: Payer: BC Managed Care – PPO

## 2022-11-28 ENCOUNTER — Ambulatory Visit: Payer: BC Managed Care – PPO

## 2022-12-02 ENCOUNTER — Ambulatory Visit (INDEPENDENT_AMBULATORY_CARE_PROVIDER_SITE_OTHER): Payer: BC Managed Care – PPO | Admitting: Internal Medicine

## 2022-12-02 ENCOUNTER — Encounter: Payer: Self-pay | Admitting: Internal Medicine

## 2022-12-02 VITALS — BP 112/80 | HR 90 | Temp 98.5°F | Ht 63.5 in | Wt 209.1 lb

## 2022-12-02 DIAGNOSIS — L309 Dermatitis, unspecified: Secondary | ICD-10-CM

## 2022-12-02 DIAGNOSIS — J309 Allergic rhinitis, unspecified: Secondary | ICD-10-CM | POA: Diagnosis not present

## 2022-12-02 DIAGNOSIS — R110 Nausea: Secondary | ICD-10-CM | POA: Insufficient documentation

## 2022-12-02 DIAGNOSIS — R519 Headache, unspecified: Secondary | ICD-10-CM | POA: Insufficient documentation

## 2022-12-02 DIAGNOSIS — S39011D Strain of muscle, fascia and tendon of abdomen, subsequent encounter: Secondary | ICD-10-CM

## 2022-12-02 MED ORDER — FLUTICASONE PROPIONATE 50 MCG/ACT NA SUSP: 2.0000 | Freq: Every day | NASAL | 3 refills | Status: DC

## 2022-12-02 MED ORDER — TRIAMCINOLONE ACETONIDE 0.5 % EX OINT
1.0000 | TOPICAL_OINTMENT | Freq: Two times a day (BID) | CUTANEOUS | 3 refills | Status: AC
Start: 2022-12-02 — End: ?

## 2022-12-02 NOTE — Progress Notes (Signed)
Established Patient Office Visit     CC/Reason for Visit: Follow-up abdominal wall strain, medication refills  HPI: Kristen [name] is a 42 y.o. female who is coming in today for the above mentioned reasons.  For the past few months she has been dealing with a right upper abdominal wall strain that she incurred while at work.  She is currently out on disability and needs updated forms signed by me today.  She continues to attend physical therapy.  Strain is still bothersome but she is now managing with over-the-counter NSAIDs.  She is also needing refills for fluticasone for her allergic rhinitis and triamcinolone ointment for her eczema.  Past Medical/Surgical History: Past Medical History:  Diagnosis Date   Alpha thalassemia (HCC)    Asthma    uses inhaler daily/as needed   Complication of anesthesia    reaction to medication in epidural -morphine   Endometriosis    Preterm labor with preterm delivery    Seasonal allergies    Tubular adenoma of colon     Past Surgical History:  Procedure Laterality Date   CESAREAN SECTION     CESAREAN SECTION N/A 10/29/2015   Procedure: CESAREAN SECTION;  Surgeon: Adam Phenix, MD;  Location: [facility] BIRTHING SUITES;  Service: Obstetrics;  Laterality: N/A;   DILATION AND CURETTAGE OF UTERUS     WISDOM TOOTH EXTRACTION  2014    Social History:  reports that she has quit smoking. Her smoking use included cigars. She has never been exposed to tobacco smoke. She has never used smokeless tobacco. She reports that she does not drink alcohol and does not use drugs.  Allergies: Allergies  Allergen Reactions   Hydrocodone Shortness Of Breath   Morphine And Codeine Shortness Of Breath and Itching    Burning   Penicillins Hives    Has patient had a PCN reaction causing immediate rash, facial/tongue/throat swelling, SOB or lightheadedness with hypotension: Yes Has patient had a PCN reaction causing severe rash involving mucus membranes or  skin necrosis: Yes Has patient had a PCN reaction that required hospitalization: No Has patient had a PCN reaction occurring within the last 10 years: Yes If all of the above answers are "NO", then may proceed with Cephalosporin use.    Percocet [Oxycodone-Acetaminophen] Anaphylaxis    Pt has taken hydromorphone without complications in the past    Family History:  Family History  Problem Relation Age of Onset   HIV Father    Colon cancer Maternal Uncle    Diabetes Maternal Grandmother    Diabetes Paternal Grandmother    Stomach cancer Neg Hx    Rectal cancer Neg Hx    Esophageal cancer Neg Hx    Liver cancer Neg Hx    Colon polyps Neg Hx      Current Outpatient Medications:    albuterol (VENTOLIN HFA) 108 (90 Base) MCG/ACT inhaler, Inhale 2 puffs into the lungs every 4 (four) hours as needed for wheezing or shortness of breath., Disp: 18 g, Rfl: 1   cetirizine (ZYRTEC) 10 MG tablet, Take 1 tablet (10 mg total) by mouth daily., Disp: 30 tablet, Rfl: 11   triamcinolone ointment (KENALOG) 0.5 %, Apply 1 Application topically 2 (two) times daily., Disp: 30 g, Rfl: 3   VITAMIN D PO, Take 1 tablet by mouth daily at 6 (six) AM., Disp: , Rfl:    fluticasone (FLONASE) 50 MCG/ACT nasal spray, Place 2 sprays into both nostrils daily., Disp: 9.9 mL, Rfl: 3  Review of Systems:  Negative unless indicated in HPI.   Physical Exam: Vitals:   12/02/22 0945  BP: 112/80  Pulse: 90  Temp: 98.5 F (36.9 C)  TempSrc: Oral  SpO2: 98%  Weight: 209 lb 1.6 oz (94.8 kg)  Height: 5' 3.5" (1.613 m)    Body mass index is 36.46 kg/m.   Physical Exam Vitals reviewed.  Constitutional:      Appearance: Normal appearance.  HENT:     Head: Normocephalic and atraumatic.  Eyes:     Conjunctiva/sclera: Conjunctivae normal.     Pupils: Pupils are equal, round, and reactive to light.  Skin:    General: Skin is warm and dry.  Neurological:     General: No focal deficit present.     Mental  Status: She is alert and oriented to person, place, and time.  Psychiatric:        Mood and Affect: Mood normal.        Behavior: Behavior normal.        Thought Content: Thought content normal.        Judgment: Judgment normal.      Impression and Plan:  Strain of abdominal wall, subsequent encounter  Eczema, unspecified type -     Triamcinolone Acetonide; Apply 1 Application topically 2 (two) times daily.  Dispense: 30 g; Refill: 3  Allergic rhinitis, unspecified seasonality, unspecified trigger -     Fluticasone Propionate; Place 2 sprays into both nostrils daily.  Dispense: 9.9 mL; Refill: 3   -Disability forms updated. -Triamcinolone and fluticasone sent.  Time spent:22 minutes reviewing chart, interviewing and examining patient and formulating plan of care.     Chaya Jan, MD [facility]

## 2022-12-03 ENCOUNTER — Ambulatory Visit: Payer: BC Managed Care – PPO | Admitting: Physical Therapy

## 2022-12-05 ENCOUNTER — Ambulatory Visit: Payer: BC Managed Care – PPO | Attending: Internal Medicine | Admitting: Physical Therapy

## 2022-12-05 ENCOUNTER — Encounter: Payer: Self-pay | Admitting: Physical Therapy

## 2022-12-05 DIAGNOSIS — R252 Cramp and spasm: Secondary | ICD-10-CM | POA: Insufficient documentation

## 2022-12-05 DIAGNOSIS — M546 Pain in thoracic spine: Secondary | ICD-10-CM | POA: Insufficient documentation

## 2022-12-05 NOTE — Therapy (Signed)
OUTPATIENT PHYSICAL THERAPY THORACOLUMBAR TREATMENT NOTE   Patient Name: Kristen [name] MRN: [id number] DOB:04-20-1980, 42 y.o., female Today's Date: 12/05/2022  END OF SESSION:  PT End of Session - 12/05/22 1107     Visit Number 15    Number of Visits 30    Date for PT Re-Evaluation 01/16/23    Authorization Type [employer]   Approved 4 visits 10/20/22-11/18/22    Authorization Time Period 11/24/22-12/23/22    Authorization - Visit Number 1    Authorization - Number of Visits 4    PT Start Time 1110   pt late- had to take a phone call   PT Stop Time 1143    PT Time Calculation (min) 33 min                        Past Medical History:  Diagnosis Date   Alpha thalassemia (HCC)    Asthma    uses inhaler daily/as needed   Complication of anesthesia    reaction to medication in epidural -morphine   Endometriosis    Preterm labor with preterm delivery    Seasonal allergies    Tubular adenoma of colon    Past Surgical History:  Procedure Laterality Date   CESAREAN SECTION     CESAREAN SECTION N/A 10/29/2015   Procedure: CESAREAN SECTION;  Surgeon: Adam Phenix, MD;  Location: [facility];  Service: Obstetrics;  Laterality: N/A;   DILATION AND CURETTAGE OF UTERUS     WISDOM TOOTH EXTRACTION  2014   Patient Active Problem List   Diagnosis Date Noted   Headache 12/02/2022   Nausea 12/02/2022   Sprain of abdominal wall 09/23/2022   Vitamin D deficiency 07/10/2022   Microcytosis 07/10/2022   Alpha thalassemia (HCC) 07/10/2022   Obesity (BMI 30.0-34.9) 07/09/2022   Hypnagogic hallucinations 10/22/2016   Gestational diabetes mellitus (GDM) affecting fifth pregnancy 10/22/2016   Unwanted fertility 09/17/2016   Bowel trouble 02/07/2016   Constipation 02/07/2016    PCP: Philip Aspen, Limmie Patricia, MD   REFERRING PROVIDER: Philip Aspen, Limmie Patricia, MD   REFERRING DIAG: [phone] (ICD-10-CM) - Strain of abdominal wall, initial  encounter   Rationale for Evaluation and Treatment: Rehabilitation  THERAPY DIAG:  Pain in thoracic spine  Cramp and spasm  ONSET DATE: June 14th, 2024  SUBJECTIVE:                                                                                                                                                                                           SUBJECTIVE STATEMENT: Overall I am better but it is up  and down. Sometimes I try to progress the exercise on my own and then I aggravate it. I'm going to try to go to the gym. My neck is better but now the pain is upper trap and along shoulder blade. Get tired driving. I feel like a robot.  Saw MD who saw improvement , still wanted her to take it slow with the right abdomen. Still out of work until end of NOV. Hope to go back for light duty   EVAL: Pt reports she strained her R ant/lat chest/abdominal area when lifting a package at [employer]. Pt notes the pain can radiate to her R back and her R upper shoulder hurts as well. At the time of the injury, the pt states the pain was intense and she felt like she was going to throw up. Pt indicates there was an area of swelling which ran along her ant/lat chest from under her R breast to her last rib. Following the injury, she worked on [employer] duty (small sort) for 1 week. Currently she is out of work on [employer]. She can return to work on light duty when she can tolerate lifting 15 lbs for 3.5 hours. In addition to pain, pt reports muscle spasms in the area. Pt has 6 children.  Pt is Rt handed  PERTINENT HISTORY:  NA  PAIN:  Are you having pain? Yes: NPRS scale:R upper trap and shoulder blade 5/10 , right side AB 5-6/10 Pain location: R ant/lat chest/abdominal area, radiates to R back, R upper shoulder Pain description: sharp, ache , throb, burning, spasms Aggravating factors: Driving Relieving factors: Cold pack, hot bath soaks Pain range on eval: 3-8/10  PRECAUTIONS: None  WEIGHT BEARING  RESTRICTIONS: No  FALLS:  Has patient fallen in last 6 months? 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 Pt's need to be able to lift 75# to return to full duty  PLOF: Independent  PATIENT GOALS: Pain relief and to return to work  OBJECTIVE:   DIAGNOSTIC FINDINGS:  NA  PATIENT SURVEYS:  FOTO: Perceived function   31%, predicted   57%  FOTO 38% 10/09/22 FOTO 42% 11/14/22  SCREENING FOR RED FLAGS: Bowel or bladder incontinence:   COGNITION: Overall cognitive status: Within functional limits for tasks assessed     SENSATION: WFL  MUSCLE LENGTH: Hamstrings: Right NT deg; Left NT deg Maisie Fus test: Right NT deg; Left NT deg  POSTURE: rounded shoulders and forward head  PALPATION: TTP to the ant/lat R rib cage from the 5th to 12th ribs and of the R upper trap  UE MMT:  MMT Right 09/04/22 Left 09/04/22 Right  11/03/22 Rt 11/14/22  Shoulder flexion 4p 5 4 4+  Shoulder extension 4p 5    Shoulder abduction 4p 5 4 4+  Shoulder adduction 4p 5    Shoulder extension 4p 5    Shoulder internal rotation 4p 5 5   Shoulder external rotation 4p 5 4+ p in right side 4+ p rt side  Elbow flexion 5 5    Elbow extension 5 5    Wrist flexion      Wrist extension      Wrist ulnar deviation      Wrist radial deviation      Wrist pronation      Wrist supination       (Blank  rows = not tested) P=denotes concordant pain  UE ROM:  AROM Right 09/04/22 Left 09/04/22 RT 10/07/22 RT 11/03/22 RT 11/14/22 RT 12/05/22  Shoulder flexion 100 150 130 128 140d p 132 A  Shoulder extension        Shoulder abduction 100 150 120 120 128d p 138 A  Shoulder adduction        Shoulder extension        Shoulder internal rotation        Shoulder external rotation        Middle trapezius        Lower trapezius         Elbow flexion        Elbow extension        Wrist flexion        Wrist extension        Wrist ulnar deviation        Wrist radial deviation        Wrist pronation        Wrist supination        Grip strength         (Blank rows = not tested)    P=denotes concordant pain  TODAY'S TREATMENT: [facility] Adult PT Treatment:                                                DATE: 11/27/22 Therapeutic Exercise: 15# 10 x 2 lifting from waist/ counter to shoulder height shelf  Palloff press Green band x 10 each  Standing alt UE/LE lifts  5 x 2 L stretch at counter Standing side bending AROM x 5 each gentle Rom Standing modified open books at wall - hand on chest   [facility] Adult PT Treatment:                                                DATE: 11/14/22 Therapeutic Exercise: Omega chest press 2x10 15# core engaged Omega chest pull 2x10 20# core engaged Omega lat pull 2x10 15# core engaged ROM MMT Therapeutic Activity: Waist to shoulder height lifting Bilat holding 15# dumb bell 1 x 10 (small staggered stance)  Dead lifts 2x5 25# Reassess FOTO and reviewed  [facility] Adult PT Treatment:                                                DATE: 11/03/22 Therapeutic Exercise: Pulleys Cybex hip abd 2x10 25# each  Therapeutic Activity: Waist to shoulder height lifting R 6# 1x10; L #8 1x10 (staggered stand)  Waist to shoulder height lifting Bilat holding 15# dumb bell 1 x 10 (small staggered stance)  Dead lifts 2x5 25#  [facility] Adult PT Treatment:                                                DATE: 10/23/22 Therapeutic Exercise: UBE L1 in each directionR lateral side stretch L doorway R lateral massage with soft roller on  wall Dead lifts 2x5 20# Forward flexed shoulder rows 2x10 10# each Cybex hip abd 2x10 25# each Cybex leg extension 2x10 70#  [facility] Adult PT Treatment:                                                DATE: 10/21/22 Therapeutic Exercise: UBE L1 in each direction Waist to  shoulder height lifting R 6# 2x10; L #8 2x10 R Upper trap stretch and L and R cervical rotation x2 each 15" Trunk rotation 2RTBs 2x10 Manual Therapy: STM to the R upper trap and lower cervical paraspinals  Skilled palpation to identify TrPs and taut muscle bands Trigger Point Dry Needling Treatment: Pre-treatment instruction: Patient instructed on dry needling rationale, procedures, and possible side effects including pain during treatment (achy,cramping feeling), bruising, drop of blood, lightheadedness, nausea, sweating. Patient Consent Given: Yes Education handout provided: Yes Muscles treated: R upper trap, R lower cervical multifidi and paraspinals Needle size and number: .30x98mm x 2 Electrical stimulation performed: No Parameters: N/A Treatment response/outcome: Twitch response elicited and Palpable decrease in muscle tension Post-treatment instructions: Patient instructed to expect possible mild to moderate muscle soreness later today and/or tomorrow. Patient instructed in methods to reduce muscle soreness and to continue prescribed HEP. If patient was dry needled over the lung field, patient was instructed on signs and symptoms of pneumothorax and, however unlikely, to see immediate medical attention should they occur. Patient was also educated on signs and symptoms of infection and to seek medical attention should they occur. Patient verbalized understanding of these instructions and education.                                   PATIENT EDUCATION:  Education details: Eval findings, POC, HEP, self care  Person educated: Patient Education method: Explanation, Demonstration, Tactile cues, Verbal cues, and Handouts Education comprehension: verbalized understanding, returned demonstration, verbal cues required, tactile cues required, and needs further education  HOME EXERCISE PROGRAM: Access Code: [email or web address] URL: [email or web address] Date: 09/18/2022 Prepared by: Joellyn Rued  Exercises - Standing 'L' Stretch at Counter  - 2 x daily - 7 x weekly - 1 sets - 10 reps - 5-20 hold - Shoulder Flexion Wall Slide with Towel  - 2 x daily - 7 x weekly - 1 sets - 10 reps - 5=10 hold - Supine Posterior Pelvic Tilt  - 2 x daily - 7 x weekly - 1 sets - 10 reps - 3 hold - Supine Shoulder Flexion Extension AAROM with Dowel  - 2 x daily - 7 x weekly - 1 sets - 10 reps - Standing Anti-Rotation Press with Anchored Resistance  - 2 x daily - 7 x weekly - 1 sets - 10 reps - 3 hold - Seated Upper Trapezius Stretch  - 1 x daily - 7 x weekly - 1 sets - 3 reps - 15-30 hold - Gentle Levator Scapulae Stretch  - 1 x daily - 7 x weekly - 1 sets - 3 reps - 15-30 hold - Hooklying Rib Cage Breathing  - 2 x daily - 7 x weekly - 2 sets - 5 reps - 5 hold - Supine March  - 2 x daily - 7 x weekly - 3 sets - 10 reps - Hooklying Isometric Hip Flexion  -  1 x daily - 7 x weekly - 2 sets - 5 reps - 5 hold - Hooklying Isometric Hip Flexion with Opposite Arm  - 1 x daily - 7 x weekly - 2 sets - 5 reps - 5 hold  ASSESSMENT:  CLINICAL IMPRESSION: Continued PT for progressive strengthening for return to function and return to work. Pt is making gradual gains regarding the physical demand she is able to tolerate, but what she is able to tolerated is much less the the demands of her occupation. Currently, lifting waist to shoulder height 15# using both hands 2 sets which meets her LTG#5 .  She hopes to return to light duty by end of November. She reports increased pain after attempting some rotational exercise on her own. Introduced pall off press for an isometric approach which she did well with. She also reports feeling robotic with her movements so introduced a gross body exercise of marching with opp arm raise which she also did well with.  Updated HEP and reduced her frequency to 1 x per week. Overall, min increase in soreness reported. Visual improvement in right OH reach when performing march.  Pt  tolerated PT today without adverse effects. 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   OBJECTIVE IMPAIRMENTS: decreased activity tolerance, decreased ROM, decreased strength, increased muscle spasms, and pain.   ACTIVITY LIMITATIONS: carrying, lifting, dressing, reach over head, and caring for others  PARTICIPATION LIMITATIONS: meal prep, cleaning, laundry, driving, occupation, and yard work  PERSONAL FACTORS: Time since onset of injury/illness/exacerbation are also affecting patient's functional outcome.   REHAB POTENTIAL: Excellent  CLINICAL DECISION MAKING: Stable/uncomplicated  EVALUATION COMPLEXITY: Low   GOALS:  SHORT TERM GOALS: Target date: 09/26/22  Pt will be Ind in an initial HEP  Baseline: started Goal status: MET 2.  Pt will voice understanding of measures to assist in pain reduction  Baseline: started 09/25/22: use of cold pack, moist heat, knee bolster for sleep position Goal status: MET   LONG TERM GOALS: Target date: 11/07/22  Pt will be Ind in a final HEP to maintain achieved LOF  Baseline: started Goal status: ONGOING  2.  Increase R shoulder flexion and abd to 145d for appropriate upper body function Baseline: 100d for both 10/07/22: Flex 130; abd 120 11/14/22: Flex 140; abd 128 12/05/22: abd 138 Goal status: Improved- ongoing  3.  Pt will report a decrease in pain to 0-3/10 with daily and work related activities form improved QOL and to return back to work Baseline: 3-8/10 See pain scale above Goal status: Improving gradually per pt's subjective report  4.  Increase R shoulder strength to 4+/5 or greater for improved R UE function for return to work  Baseline: See flow sheets 11/03/22: see chart, partially me 11/14/22: see flow sheet Goal status: MET  5.  Pt will be able to handle 15#, lifting from waist to shoulder height 2 set of 10  Baseline: not  tested 10/14/22: 3# and 5# waist to shoulder height x10 each 11/03/22: can lift 15# dumb bell using both UE from counter to should height shelf 1 x 10 12/05/22: can lift 15# dumb bell using both UE from counter to should height shelf 2 x 10 Goal status: Improved- MET  6.  Pt will be able to handle 75#, lifting from floor to waist height  2 sets of 10 Baseline: not tested 10/17/22: 10# hinged hip  lifting 11/03/22: 25 # hinged hip lifting  Goal status: Ongoing  7.  Pt's FOTO score will improved to the predicted value of 57% as indication of improved function  Baseline: 31% 10/09/22: 38%  11/18/22: Goal status: Ongoing  PLAN:  PT FREQUENCY: 2x/week  PT DURATION: 6 weeks  PLANNED INTERVENTIONS: Therapeutic exercises, Therapeutic activity, Patient/Family education, Self Care, Joint mobilization, Dry Needling, Electrical stimulation, Cryotherapy, Moist heat, Taping, Ultrasound, Ionotophoresis 4mg /ml Dexamethasone, Manual therapy, and Re-evaluation.  PLAN FOR NEXT SESSION: Review FOTO; assess response to HEP; progress therex as indicated; use of modalities, manual therapy; and TPDN as indicated.  Jannette Spanner, PTA 12/05/22 1:06 PM Phone: [phone] Fax: [phone]

## 2022-12-09 ENCOUNTER — Ambulatory Visit: Payer: BC Managed Care – PPO | Admitting: Physical Therapy

## 2022-12-11 ENCOUNTER — Ambulatory Visit: Payer: BC Managed Care – PPO

## 2022-12-11 DIAGNOSIS — M546 Pain in thoracic spine: Secondary | ICD-10-CM | POA: Diagnosis not present

## 2022-12-11 DIAGNOSIS — R252 Cramp and spasm: Secondary | ICD-10-CM | POA: Diagnosis not present

## 2022-12-11 NOTE — Therapy (Signed)
OUTPATIENT PHYSICAL THERAPY THORACOLUMBAR TREATMENT NOTE   Patient Name: Kristen [name] MRN: [id number] DOB:07-Nov-1980, 42 y.o., female Today's Date: 12/11/2022  END OF SESSION:  PT End of Session - 12/11/22 1027     Visit Number 16    Number of Visits 30    Date for PT Re-Evaluation 01/16/23    Authorization Type [employer]   Approved 4 visits 10/20/22-11/18/22    Authorization Time Period 11/24/22-12/23/22    Authorization - Visit Number 2    Authorization - Number of Visits 4    PT Start Time 1021    PT Stop Time 1104    PT Time Calculation (min) 43 min    Activity Tolerance Patient tolerated treatment well    Behavior During Therapy WFL for tasks assessed/performed                         Past Medical History:  Diagnosis Date   Alpha thalassemia (HCC)    Asthma    uses inhaler daily/as needed   Complication of anesthesia    reaction to medication in epidural -morphine   Endometriosis    Preterm labor with preterm delivery    Seasonal allergies    Tubular adenoma of colon    Past Surgical History:  Procedure Laterality Date   CESAREAN SECTION     CESAREAN SECTION N/A 10/29/2015   Procedure: CESAREAN SECTION;  Surgeon: Adam Phenix, MD;  Location: [facility] BIRTHING SUITES;  Service: Obstetrics;  Laterality: N/A;   DILATION AND CURETTAGE OF UTERUS     WISDOM TOOTH EXTRACTION  2014   Patient Active Problem List   Diagnosis Date Noted   Headache 12/02/2022   Nausea 12/02/2022   Sprain of abdominal wall 09/23/2022   Vitamin D deficiency 07/10/2022   Microcytosis 07/10/2022   Alpha thalassemia (HCC) 07/10/2022   Obesity (BMI 30.0-34.9) 07/09/2022   Hypnagogic hallucinations 10/22/2016   Gestational diabetes mellitus (GDM) affecting fifth pregnancy 10/22/2016   Unwanted fertility 09/17/2016   Bowel trouble 02/07/2016   Constipation 02/07/2016    PCP: Philip Aspen, Limmie Patricia, MD   REFERRING PROVIDER: Philip Aspen, Limmie Patricia, MD   REFERRING DIAG: [phone] (ICD-10-CM) - Strain of abdominal wall, initial encounter   Rationale for Evaluation and Treatment: Rehabilitation  THERAPY DIAG:  Pain in thoracic spine  Cramp and spasm  ONSET DATE: June 14th, 2024  SUBJECTIVE:  SUBJECTIVE STATEMENT: Since the last session, pt reports she has been more active with stretches and lifting with a kettle ball at home. The areas of pain are decreasing in size.  EVAL: Pt reports she strained her R ant/lat chest/abdominal area when lifting a package at [employer]. Pt notes the pain can radiate to her R back and her R upper shoulder hurts as well. At the time of the injury, the pt states the pain was intense and she felt like she was going to throw up. Pt indicates there was an area of swelling which ran along her ant/lat chest from under her R breast to her last rib. Following the injury, she worked on [employer] duty (small sort) for 1 week. Currently she is out of work on [employer]. She can return to work on light duty when she can tolerate lifting 15 lbs for 3.5 hours. In addition to pain, pt reports muscle spasms in the area. Pt has 6 children.  Pt is Rt handed  PERTINENT HISTORY:  NA  PAIN:  Are you having pain? Yes: NPRS scale:R upper trap and shoulder blade 4/10 , right side AB 5/10 Pain location: R ant/lat chest/abdominal area, radiates to R back, R upper shoulder Pain description: sharp, ache , throb, burning, spasms Aggravating factors: Driving Relieving factors: Cold pack, hot bath soaks Pain range on eval: 3-8/10  PRECAUTIONS: None  WEIGHT BEARING RESTRICTIONS: No  FALLS:  Has patient fallen in last 6 months? 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 Pt's need to be able to lift 75# to return to full duty  PLOF: Independent  PATIENT GOALS: Pain relief and to return to work  OBJECTIVE:   DIAGNOSTIC FINDINGS:  NA  PATIENT SURVEYS:  FOTO: Perceived function   31%, predicted   57%  FOTO 38% 10/09/22 FOTO 42% 11/14/22  SCREENING FOR RED FLAGS: Bowel or bladder incontinence:   COGNITION: Overall cognitive status: Within functional limits for tasks assessed     SENSATION: WFL  MUSCLE LENGTH: Hamstrings: Right NT deg; Left NT deg Maisie Fus test: Right NT deg; Left NT deg  POSTURE: rounded shoulders and forward head  PALPATION: TTP to the ant/lat R rib cage from the 5th to 12th ribs and of the R upper trap  UE MMT:  MMT Right 09/04/22 Left 09/04/22 Right  11/03/22 Rt 11/14/22  Shoulder flexion 4p 5 4 4+  Shoulder extension 4p 5    Shoulder abduction 4p 5 4 4+  Shoulder adduction 4p 5    Shoulder extension 4p 5    Shoulder internal rotation 4p 5 5   Shoulder external rotation 4p 5 4+ p in right side 4+ p rt side  Elbow flexion 5 5    Elbow extension 5 5    Wrist flexion      Wrist extension      Wrist ulnar deviation      Wrist radial deviation      Wrist pronation      Wrist supination       (Blank rows = not tested) P=denotes concordant pain  UE ROM:  AROM Right 09/04/22 Left 09/04/22 RT 10/07/22 RT 11/03/22 RT 11/14/22 RT 12/05/22  Shoulder flexion 100 150 130 128 140d p 132 A  Shoulder extension        Shoulder abduction 100 150 120 120 128d  p 138 A  Shoulder adduction        Shoulder extension        Shoulder internal rotation        Shoulder external rotation        Middle trapezius        Lower trapezius        Elbow flexion        Elbow extension        Wrist flexion        Wrist extension        Wrist ulnar deviation        Wrist radial deviation        Wrist pronation        Wrist  supination        Grip strength         (Blank rows = not tested)    P=denotes concordant pain  TODAY'S TREATMENT: [facility] Adult PT Treatment:                                                DATE: 12/11/22 Therapeutic Exercise: 15# 10 x 2 lifting from waist/ counter to shoulder height shelf  Palloff press side steps Green band x 10 each side Palloff rotation with GTB x15 each side Standing alt UE lifts 10 x 2, 8# then 10#, waist to head height Waist level lift with turn to above shoulder lift 15#, each side L stretch at counter, forward and laterally  [facility] Adult PT Treatment:                                                DATE: 11/27/22 Therapeutic Exercise: 15# 10 x 2 lifting from waist/ counter to shoulder height shelf  Palloff press Green band x 10 each  Standing alt UE/LE lifts  10 x 2, 8# then 10# L stretch at counter Standing side bending AROM x 5 each gentle Rom Standing modified open books at wall - hand on chest   [facility] Adult PT Treatment:                                                DATE: 11/14/22 Therapeutic Exercise: Omega chest press 2x10 15# core engaged Omega chest pull 2x10 20# core engaged Omega lat pull 2x10 15# core engaged ROM MMT Therapeutic Activity: Waist to shoulder height lifting Bilat holding 15# dumb bell 1 x 10 (small staggered stance)  Dead lifts 2x5 25# Reassess FOTO and reviewed  [facility] Adult PT Treatment:                                                DATE: 11/03/22 Therapeutic Exercise: Pulleys Cybex hip abd 2x10 25# each  Therapeutic Activity: Waist to shoulder height lifting R 6# 1x10; L #8 1x10 (staggered stand)  Waist to shoulder height lifting Bilat holding 15# dumb bell 1 x 10 (small staggered stance)  Dead lifts 2x5 25#  [facility] Adult PT  Treatment:                                                DATE: 10/23/22 Therapeutic Exercise: UBE L1 in each directionR lateral side stretch L doorway R lateral massage with soft roller on wall Dead  lifts 2x5 20# Forward flexed shoulder rows 2x10 10# each Cybex hip abd 2x10 25# each Cybex leg extension 2x10 70#  [facility] Adult PT Treatment:                                                DATE: 10/21/22 Therapeutic Exercise: UBE L1 in each direction Waist to shoulder height lifting R 6# 2x10; L #8 2x10 R Upper trap stretch and L and R cervical rotation x2 each 15" Trunk rotation 2RTBs 2x10 Manual Therapy: STM to the R upper trap and lower cervical paraspinals  Skilled palpation to identify TrPs and taut muscle bands Trigger Point Dry Needling Treatment: Pre-treatment instruction: Patient instructed on dry needling rationale, procedures, and possible side effects including pain during treatment (achy,cramping feeling), bruising, drop of blood, lightheadedness, nausea, sweating. Patient Consent Given: Yes Education handout provided: Yes Muscles treated: R upper trap, R lower cervical multifidi and paraspinals Needle size and number: .30x31mm x 2 Electrical stimulation performed: No Parameters: N/A Treatment response/outcome: Twitch response elicited and Palpable decrease in muscle tension Post-treatment instructions: Patient instructed to expect possible mild to moderate muscle soreness later today and/or tomorrow. Patient instructed in methods to reduce muscle soreness and to continue prescribed HEP. If patient was dry needled over the lung field, patient was instructed on signs and symptoms of pneumothorax and, however unlikely, to see immediate medical attention should they occur. Patient was also educated on signs and symptoms of infection and to seek medical attention should they occur. Patient verbalized understanding of these instructions and education.                                   PATIENT EDUCATION:  Education details: Eval findings, POC, HEP, self care  Person educated: Patient Education method: Explanation, Demonstration, Tactile cues, Verbal cues, and  Handouts Education comprehension: verbalized understanding, returned demonstration, verbal cues required, tactile cues required, and needs further education  HOME EXERCISE PROGRAM: Access Code: [email or web address] URL: [email or web address] Date: 09/18/2022 Prepared by: Joellyn Rued  Exercises - Standing 'L' Stretch at Counter  - 2 x daily - 7 x weekly - 1 sets - 10 reps - 5-20 hold - Shoulder Flexion Wall Slide with Towel  - 2 x daily - 7 x weekly - 1 sets - 10 reps - 5=10 hold - Supine Posterior Pelvic Tilt  - 2 x daily - 7 x weekly - 1 sets - 10 reps - 3 hold - Supine Shoulder Flexion Extension AAROM with Dowel  - 2 x daily - 7 x weekly - 1 sets - 10 reps - Standing Anti-Rotation Press with Anchored Resistance  - 2 x daily - 7 x weekly - 1 sets - 10 reps - 3 hold - Seated Upper Trapezius Stretch  - 1 x daily - 7 x weekly - 1 sets - 3 reps - 15-30 hold -  Gentle Levator Scapulae Stretch  - 1 x daily - 7 x weekly - 1 sets - 3 reps - 15-30 hold - Hooklying Rib Cage Breathing  - 2 x daily - 7 x weekly - 2 sets - 5 reps - 5 hold - Supine March  - 2 x daily - 7 x weekly - 3 sets - 10 reps - Hooklying Isometric Hip Flexion  - 1 x daily - 7 x weekly - 2 sets - 5 reps - 5 hold - Hooklying Isometric Hip Flexion with Opposite Arm  - 1 x daily - 7 x weekly - 2 sets - 5 reps - 5 hold  ASSESSMENT:  CLINICAL IMPRESSION: Continued PT for progressive strengthening for return to function and return to work. Pt is making gradual gains regarding the physical demand she is able to tolerate. Pt's subjective reports tolerating increased demand with her HEP as well. Lifting activities were completed with more dynamic movements like the pt will experience with her work. 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   OBJECTIVE IMPAIRMENTS: decreased activity tolerance, decreased ROM, decreased strength, increased  muscle spasms, and pain.   ACTIVITY LIMITATIONS: carrying, lifting, dressing, reach over head, and caring for others  PARTICIPATION LIMITATIONS: meal prep, cleaning, laundry, driving, occupation, and yard work  PERSONAL FACTORS: Time since onset of injury/illness/exacerbation are also affecting patient's functional outcome.   REHAB POTENTIAL: Excellent  CLINICAL DECISION MAKING: Stable/uncomplicated  EVALUATION COMPLEXITY: Low   GOALS:  SHORT TERM GOALS: Target date: 09/26/22  Pt will be Ind in an initial HEP  Baseline: started Goal status: MET 2.  Pt will voice understanding of measures to assist in pain reduction  Baseline: started 09/25/22: use of cold pack, moist heat, knee bolster for sleep position Goal status: MET   LONG TERM GOALS: Target date: 11/07/22  Pt will be Ind in a final HEP to maintain achieved LOF  Baseline: started Goal status: ONGOING  2.  Increase R shoulder flexion and abd to 145d for appropriate upper body function Baseline: 100d for both 10/07/22: Flex 130; abd 120 11/14/22: Flex 140; abd 128 12/05/22: abd 138 Goal status: Improved- ongoing  3.  Pt will report a decrease in pain to 0-3/10 with daily and work related activities form improved QOL and to return back to work Baseline: 3-8/10 See pain scale above Goal status: Improving gradually per pt's subjective report  4.  Increase R shoulder strength to 4+/5 or greater for improved R UE function for return to work  Baseline: See flow sheets 11/03/22: see chart, partially me 11/14/22: see flow sheet Goal status: MET  5.  Pt will be able to handle 15#, lifting from waist to shoulder height 2 set of 10  Baseline: not tested 10/14/22: 3# and 5# waist to shoulder height x10 each 11/03/22: can lift 15# dumb bell using both UE from counter to should height shelf 1 x 10 12/05/22: can lift 15# dumb bell using both UE from counter to should height shelf 2 x 10 Goal status: Improved- MET  6.  Pt will be able  to handle 75#, lifting from floor to waist height  2 sets of 10 Baseline: not tested 10/17/22: 10# hinged hip lifting 11/03/22: 25 # hinged hip lifting  Goal status: Ongoing  7.  Pt's FOTO score will improved to the predicted value of 57% as indication of improved function  Baseline: 31% 10/09/22:  38%  11/18/22: Goal status: Ongoing  PLAN:  PT FREQUENCY: 2x/week  PT DURATION: 6 weeks  PLANNED INTERVENTIONS: Therapeutic exercises, Therapeutic activity, Patient/Family education, Self Care, Joint mobilization, Dry Needling, Electrical stimulation, Cryotherapy, Moist heat, Taping, Ultrasound, Ionotophoresis 4mg /ml Dexamethasone, Manual therapy, and Re-evaluation.  PLAN FOR NEXT SESSION: Review FOTO; assess response to HEP; progress therex as indicated; use of modalities, manual therapy; and TPDN as indicated.  Christie Copley MS, PT 12/11/22 11:06 AM

## 2022-12-15 NOTE — Therapy (Signed)
OUTPATIENT PHYSICAL THERAPY THORACOLUMBAR TREATMENT NOTE   Patient Name: Kristen [name] MRN: [id number] DOB:[birth year], 42 y.o., female Today's Date: 12/16/2022  END OF SESSION:  PT End of Session - 12/16/22 1026     Visit Number 17    Number of Visits 30    Date for PT Re-Evaluation 01/16/23    Authorization Type [employer]   Approved 4 visits 10/20/22-11/18/22    Authorization Time Period 11/24/22-12/23/22    Authorization - Visit Number 3    PT Start Time 1025    PT Stop Time 1103    PT Time Calculation (min) 38 min    Activity Tolerance Patient tolerated treatment well    Behavior During Therapy WFL for tasks assessed/performed                          Past Medical History:  Diagnosis Date   Alpha thalassemia (HCC)    Asthma    uses inhaler daily/as needed   Complication of anesthesia    reaction to medication in epidural -morphine   Endometriosis    Preterm labor with preterm delivery    Seasonal allergies    Tubular adenoma of colon    Past Surgical History:  Procedure Laterality Date   CESAREAN SECTION     CESAREAN SECTION N/A 10/29/2015   Procedure: CESAREAN SECTION;  Surgeon: Adam Phenix, MD;  Location: [facility] SUIT[facility];  Service: Obstetrics;  Laterality: N/A;   DILATION AND CURETTAGE OF UTERUS     WISDOM TOOTH EXTRACTION  2014   Patient Active Problem List   Diagnosis Date Noted   Headache 12/02/2022   Nausea 12/02/2022   Sprain of abdominal wall 09/23/2022   Vitamin D deficiency 07/10/2022   Microcytosis 07/10/2022   Alpha thalassemia (HCC) 07/10/2022   Obesity (BMI 30.0-34.9) 07/09/2022   Hypnagogic hallucinations 10/22/2016   Gestational diabetes mellitus (GDM) affecting fifth pregnancy 10/22/2016   Unwanted fertility 09/17/2016   Bowel trouble 02/07/2016   Constipation 02/07/2016    PCP: Philip Aspen, Limmie Patricia, MD   REFERRING PROVIDER: Philip Aspen, Limmie Patricia, MD   REFERRING DIAG: [phone]  (ICD-10-CM) - Strain of abdominal wall, initial encounter   Rationale for Evaluation and Treatment: Rehabilitation  THERAPY DIAG:  Pain in thoracic spine  Cramp and spasm  ONSET DATE: June 14th, 2024  SUBJECTIVE:                                                                                                                                                                                           SUBJECTIVE STATEMENT: Since the last session, pt  reports she was very sore which lasted till yesterday, but no swelling. She does fell more stable with her trunk movements and the stability of the R arm feels more like the left.  EVAL: Pt reports she strained her R ant/lat chest/abdominal area when lifting a package at [employer]. Pt notes the pain can radiate to her R back and her R upper shoulder hurts as well. At the time of the injury, the pt states the pain was intense and she felt like she was going to throw up. Pt indicates there was an area of swelling which ran along her ant/lat chest from under her R breast to her last rib. Following the injury, she worked on [employer] duty (small sort) for 1 week. Currently she is out of work on [employer]. She can return to work on light duty when she can tolerate lifting 15 lbs for 3.5 hours. In addition to pain, pt reports muscle spasms in the area. Pt has 6 children.  Pt is Rt handed  PERTINENT HISTORY:  NA  PAIN:  Are you having pain? Yes: NPRS scale:R upper trap and shoulder blade 4-5/10 , right side AB 3-4/10 Pain location: R ant/lat chest/abdominal area, radiates to R back, R upper shoulder Pain description: sharp, ache , throb, burning, spasms Aggravating factors: Driving Relieving factors: Cold pack, hot bath soaks Pain range on eval: 3-8/10  PRECAUTIONS: None  WEIGHT BEARING RESTRICTIONS: No  FALLS:  Has patient fallen in last 6 months? 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 Pt's need to be able to lift 75# to return to full duty  PLOF: Independent  PATIENT GOALS: Pain relief and to return to work  OBJECTIVE:   DIAGNOSTIC FINDINGS:  NA  PATIENT SURVEYS:  FOTO: Perceived function   31%, predicted   57%  FOTO 38% 10/09/22 FOTO 42% 11/14/22  SCREENING FOR RED FLAGS: Bowel or bladder incontinence:   COGNITION: Overall cognitive status: Within functional limits for tasks assessed     SENSATION: WFL  MUSCLE LENGTH: Hamstrings: Right NT deg; Left NT deg Maisie Fus test: Right NT deg; Left NT deg  POSTURE: rounded shoulders and forward head  PALPATION: TTP to the ant/lat R rib cage from the 5th to 12th ribs and of the R upper trap  UE MMT:  MMT Right 09/04/22 Left 09/04/22 Right  11/03/22 Rt 11/14/22  Shoulder flexion 4p 5 4 4+  Shoulder extension 4p 5    Shoulder abduction 4p 5 4 4+  Shoulder adduction 4p 5    Shoulder extension 4p 5    Shoulder internal rotation 4p 5 5   Shoulder external rotation 4p 5 4+ p in right side 4+ p rt side  Elbow flexion 5 5    Elbow extension 5 5    Wrist flexion      Wrist extension      Wrist ulnar deviation      Wrist radial deviation      Wrist pronation      Wrist supination       (Blank rows = not tested) P=denotes concordant pain  UE ROM:  AROM Right 09/04/22 Left 09/04/22 RT 10/07/22 RT 11/03/22 RT 11/14/22 RT 12/05/22  Shoulder flexion 100 150 130 128 140d p 132 A  Shoulder extension        Shoulder abduction 100 150 120  120 128d p 138 A  Shoulder adduction        Shoulder extension        Shoulder internal rotation        Shoulder external rotation        Middle trapezius        Lower trapezius        Elbow flexion        Elbow extension        Wrist flexion        Wrist extension        Wrist ulnar deviation        Wrist radial deviation        Wrist pronation         Wrist supination        Grip strength         (Blank rows = not tested)    P=denotes concordant pain  TODAY'S TREATMENT: [facility] Adult PT Treatment:                                                DATE: 12/16/22 Therapeutic Exercise: Serratus push up at wall x10 L stretch at counter, forward and laterally Standing alt UE lifts 10 x 2, 7#, waist to head height Marching on airex c UEs 90d c 3# wts Doorway ITB/QL stretches Banded Lateral steps c BluTB 20' x4 High knee steps c opp arm lifts and heel lifts 20' x 2 Forward step ups 8" c opp arm lifts x10  [facility] Adult PT Treatment:                                                DATE: 12/11/22 Therapeutic Exercise: 15# 10 x 2 lifting from waist/ counter to shoulder height shelf  Palloff press side steps Green band x 10 each side Palloff rotation with GTB x15 each side Standing alt UE lifts 10 x 2, 8# then 10#, waist to head height Waist level lift with turn to above shoulder lift 15#, each side L stretch at counter, forward and laterally  [facility] Adult PT Treatment:                                                DATE: 11/27/22 Therapeutic Exercise: 15# 10 x 2 lifting from waist/ counter to shoulder height shelf  Palloff press Green band x 10 each  Standing alt UE/LE lifts  10 x 2, 8# then 10# L stretch at counter Standing side bending AROM x 5 each gentle Rom Standing modified open books at wall - hand on chest   PATIENT EDUCATION:  Education details: Eval findings, POC, HEP, self care  Person educated: Patient Education method: Explanation, Demonstration, Tactile cues, Verbal cues, and Handouts Education comprehension: verbalized understanding, returned demonstration, verbal cues required, tactile cues required, and needs further education  HOME EXERCISE PROGRAM: Access Code: [email or web address] URL: [email or web address] Date: 09/18/2022 Prepared by: Joellyn Rued  Exercises - Standing 'L' Stretch at Counter  - 2 x daily - 7 x  weekly - 1 sets - 10 reps - 5-20 hold - Shoulder Flexion  Wall Slide with Towel  - 2 x daily - 7 x weekly - 1 sets - 10 reps - 5=10 hold - Supine Posterior Pelvic Tilt  - 2 x daily - 7 x weekly - 1 sets - 10 reps - 3 hold - Supine Shoulder Flexion Extension AAROM with Dowel  - 2 x daily - 7 x weekly - 1 sets - 10 reps - Standing Anti-Rotation Press with Anchored Resistance  - 2 x daily - 7 x weekly - 1 sets - 10 reps - 3 hold - Seated Upper Trapezius Stretch  - 1 x daily - 7 x weekly - 1 sets - 3 reps - 15-30 hold - Gentle Levator Scapulae Stretch  - 1 x daily - 7 x weekly - 1 sets - 3 reps - 15-30 hold - Hooklying Rib Cage Breathing  - 2 x daily - 7 x weekly - 2 sets - 5 reps - 5 hold - Supine March  - 2 x daily - 7 x weekly - 3 sets - 10 reps - Hooklying Isometric Hip Flexion  - 1 x daily - 7 x weekly - 2 sets - 5 reps - 5 hold - Hooklying Isometric Hip Flexion with Opposite Arm  - 1 x daily - 7 x weekly - 2 sets - 5 reps - 5 hold  ASSESSMENT:  CLINICAL IMPRESSION: Continued PT for core, upper and lower body strengthening for return to function and return to work. Demand was decreased today with the pt experiencing an extended time frame of soreness following the last session. Pt noted with lifting activities, the R feels more similar to the L as far as controlling weight both concentrically and eccentrically. Pt is noticing physical improvement with the functional use of the R side. Will reassess LTGs the next PT session. Pt tolerated PT today without adverse effects   OBJECTIVE IMPAIRMENTS: decreased activity tolerance, decreased ROM, decreased strength, increased muscle spasms, and pain.   ACTIVITY LIMITATIONS: carrying, lifting, dressing, reach over head, and caring for others  PARTICIPATION LIMITATIONS: meal prep, cleaning, laundry, driving, occupation, and yard work  PERSONAL FACTORS: Time since onset of injury/illness/exacerbation are also affecting patient's functional outcome.    REHAB POTENTIAL: Excellent  CLINICAL DECISION MAKING: Stable/uncomplicated  EVALUATION COMPLEXITY: Low   GOALS:  SHORT TERM GOALS: Target date: 09/26/22  Pt will be Ind in an initial HEP  Baseline: started Goal status: MET 2.  Pt will voice understanding of measures to assist in pain reduction  Baseline: started 09/25/22: use of cold pack, moist heat, knee bolster for sleep position Goal status: MET   LONG TERM GOALS: Target date: 11/07/22  Pt will be Ind in a final HEP to maintain achieved LOF  Baseline: started Goal status: ONGOING  2.  Increase R shoulder flexion and abd to 145d for appropriate upper body function Baseline: 100d for both 10/07/22: Flex 130; abd 120 11/14/22: Flex 140; abd 128 12/05/22: abd 138 Goal status: Improved- ongoing  3.  Pt will report a decrease in pain to 0-3/10 with daily and work related activities form improved QOL and to return back to work Baseline: 3-8/10 See pain scale above Goal status: Improving gradually per pt's subjective report  4.  Increase R shoulder strength to 4+/5 or greater for improved R UE function for return to work  Baseline: See flow sheets 11/03/22: see chart, partially me 11/14/22: see flow sheet Goal status: MET  5.  Pt will be able to handle 15#, lifting from waist  to shoulder height 2 set of 10  Baseline: not tested 10/14/22: 3# and 5# waist to shoulder height x10 each 11/03/22: can lift 15# dumb bell using both UE from counter to should height shelf 1 x 10 12/05/22: can lift 15# dumb bell using both UE from counter to should height shelf 2 x 10 Goal status: Improved- MET  6.  Pt will be able to handle 75#, lifting from floor to waist height  2 sets of 10 Baseline: not tested 10/17/22: 10# hinged hip lifting 11/03/22: 25 # hinged hip lifting  Goal status: Ongoing  7.  Pt's FOTO score will improved to the predicted value of 57% as indication of improved function  Baseline: 31% 10/09/22: 38%  11/18/22: Goal status:  Ongoing  PLAN:  PT FREQUENCY: 2x/week  PT DURATION: 6 weeks  PLANNED INTERVENTIONS: Therapeutic exercises, Therapeutic activity, Patient/Family education, Self Care, Joint mobilization, Dry Needling, Electrical stimulation, Cryotherapy, Moist heat, Taping, Ultrasound, Ionotophoresis 4mg /ml Dexamethasone, Manual therapy, and Re-evaluation.  PLAN FOR NEXT SESSION: Review FOTO; assess response to HEP; progress therex as indicated; use of modalities, manual therapy; and TPDN as indicated.  Corrin Hingle MS, PT 12/16/22 11:21 AM

## 2022-12-16 ENCOUNTER — Ambulatory Visit: Payer: BC Managed Care – PPO

## 2022-12-16 DIAGNOSIS — M546 Pain in thoracic spine: Secondary | ICD-10-CM

## 2022-12-16 DIAGNOSIS — R252 Cramp and spasm: Secondary | ICD-10-CM | POA: Diagnosis not present

## 2022-12-23 ENCOUNTER — Encounter: Payer: Self-pay | Admitting: Physical Therapy

## 2022-12-23 ENCOUNTER — Ambulatory Visit: Payer: BC Managed Care – PPO | Admitting: Physical Therapy

## 2022-12-23 DIAGNOSIS — M546 Pain in thoracic spine: Secondary | ICD-10-CM

## 2022-12-23 DIAGNOSIS — R252 Cramp and spasm: Secondary | ICD-10-CM | POA: Diagnosis not present

## 2022-12-23 NOTE — Therapy (Addendum)
OUTPATIENT PHYSICAL THERAPY THORACOLUMBAR TREATMENT NOTE   Patient Name: Kristen [name] MRN: [id number] DOB:[birth year], 42 y.o., female Today's Date: 12/23/2022  END OF SESSION:  PT End of Session - 12/23/22 1017     Visit Number 18    Number of Visits 30    Date for PT Re-Evaluation 01/16/23    Authorization Type [employer]   Approved 4 visits 10/20/22-11/18/22    Authorization Time Period 11/24/22-12/23/22    Authorization - Visit Number 4    Authorization - Number of Visits 4    PT Start Time 1019    PT Stop Time 1057    PT Time Calculation (min) 38 min                          Past Medical History:  Diagnosis Date   Alpha thalassemia (HCC)    Asthma    uses inhaler daily/as needed   Complication of anesthesia    reaction to medication in epidural -morphine   Endometriosis    Preterm labor with preterm delivery    Seasonal allergies    Tubular adenoma of colon    Past Surgical History:  Procedure Laterality Date   CESAREAN SECTION     CESAREAN SECTION N/A 10/29/2015   Procedure: CESAREAN SECTION;  Surgeon: Adam Phenix, MD;  Location: [facility] BIRTHING SUITES;  Service: Obstetrics;  Laterality: N/A;   DILATION AND CURETTAGE OF UTERUS     WISDOM TOOTH EXTRACTION  2014   Patient Active Problem List   Diagnosis Date Noted   Headache 12/02/2022   Nausea 12/02/2022   Sprain of abdominal wall 09/23/2022   Vitamin D deficiency 07/10/2022   Microcytosis 07/10/2022   Alpha thalassemia (HCC) 07/10/2022   Obesity (BMI 30.0-34.9) 07/09/2022   Hypnagogic hallucinations 10/22/2016   Gestational diabetes mellitus (GDM) affecting fifth pregnancy 10/22/2016   Unwanted fertility 09/17/2016   Bowel trouble 02/07/2016   Constipation 02/07/2016    PCP: Philip Aspen, Limmie Patricia, MD   REFERRING PROVIDER: Philip Aspen, Limmie Patricia, MD   REFERRING DIAG: [phone] (ICD-10-CM) - Strain of abdominal wall, initial encounter   Rationale for  Evaluation and Treatment: Rehabilitation  THERAPY DIAG:  Pain in thoracic spine  Cramp and spasm  ONSET DATE: June 14th, 2024  SUBJECTIVE:                                                                                                                                                                                           SUBJECTIVE STATEMENT: 12/23/22: Pt reports she has not been going to the gym yet but is working  at home with a kettle ball every other day due to soreness. Pain is localized to right upper abdominal and right upper trap/ peri scap. Can sleep 4 hours straight, waking only once per night.    EVAL: Pt reports she strained her R ant/lat chest/abdominal area when lifting a package at [employer]. Pt notes the pain can radiate to her R back and her R upper shoulder hurts as well. At the time of the injury, the pt states the pain was intense and she felt like she was going to throw up. Pt indicates there was an area of swelling which ran along her ant/lat chest from under her R breast to her last rib. Following the injury, she worked on [employer] duty (small sort) for 1 week. Currently she is out of work on [employer]. She can return to work on light duty when she can tolerate lifting 15 lbs for 3.5 hours. In addition to pain, pt reports muscle spasms in the area. Pt has 6 children.  Pt is Rt handed  PERTINENT HISTORY:  NA  PAIN:  Are you having pain? Yes: NPRS scale: R upper trap 4/10 , right side AB 3/10, Right periscap 3/10 Pain location: R ant/lat chest/abdominal area, radiates to R back, R upper shoulder Pain description: stab in scap, upper trap aches, abdomen burns Aggravating factors: HEP, driving, sweeping, pushing large grocery cart  Relieving factors: Cold pack, hot bath soaks Pain range on eval: 3-8/10; pain range 12/23/22: 3-6/10  PRECAUTIONS: None  WEIGHT BEARING RESTRICTIONS: No  FALLS:  Has patient fallen in last 6 months? 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 Pt's need to be able to lift 75# to return to full duty  PLOF: Independent  PATIENT GOALS: Pain relief and to return to work  OBJECTIVE:   DIAGNOSTIC FINDINGS:  NA  PATIENT SURVEYS:  FOTO: Perceived function   31%, predicted   57%  FOTO 38% 10/09/22 FOTO 42% 11/14/22 FOTO 43% 12/23/22  SCREENING FOR RED FLAGS: Bowel or bladder incontinence:   COGNITION: Overall cognitive status: Within functional limits for tasks assessed     SENSATION: WFL  MUSCLE LENGTH: Hamstrings: Right NT deg; Left NT deg Maisie Fus test: Right NT deg; Left NT deg  POSTURE: rounded shoulders and forward head  PALPATION: TTP to the ant/lat R rib cage from the 5th to 12th ribs and of the R upper trap  UE MMT:  MMT Right 09/04/22 Left 09/04/22 Right  11/03/22 Rt 11/14/22  Shoulder flexion 4p 5 4 4+  Shoulder extension 4p 5    Shoulder abduction 4p 5 4 4+  Shoulder adduction 4p 5    Shoulder extension 4p 5    Shoulder internal rotation 4p 5 5   Shoulder external rotation 4p 5 4+ p in right side 4+ p rt side  Elbow flexion 5 5    Elbow extension 5 5    Wrist flexion      Wrist extension      Wrist ulnar deviation      Wrist radial deviation      Wrist pronation      Wrist supination       (Blank rows = not tested) P=denotes concordant pain  UE ROM:  AROM Right 09/04/22 Left 09/04/22 RT 10/07/22 RT 11/03/22 RT 11/14/22 RT 12/05/22 RT 12/23/22  Shoulder flexion  100 150 130 128 140d p 132 A 145d   Shoulder extension         Shoulder abduction 100 150 120 120 128d p 138 A 140d  Shoulder adduction         Shoulder extension         Shoulder internal rotation         Shoulder external rotation         Middle trapezius         Lower trapezius         Elbow flexion         Elbow extension         Wrist  flexion         Wrist extension         Wrist ulnar deviation         Wrist radial deviation         Wrist pronation         Wrist supination         Grip strength          (Blank rows = not tested)    P=denotes concordant pain  TODAY'S TREATMENT: [facility] Adult PT Treatment:                                                DATE: 12/23/22 Therapeutic Exercise: UT and levator stretches adding overpressure  Wall slides for right shoulder flexion and abduction x 10 each S/L shoulder abduction and flexion x 10 each Open book x 10 each Therapeutic Activity: AROM/ FOTO outcome measure Update of goal status    [facility] Adult PT Treatment:                                                DATE: 12/16/22 Therapeutic Exercise: Serratus push up at wall x10 L stretch at counter, forward and laterally Standing alt UE lifts 10 x 2, 7#, waist to head height Marching on airex c UEs 90d c 3# wts Doorway ITB/QL stretches Banded Lateral steps c BluTB 20' x4 High knee steps c opp arm lifts and heel lifts 20' x 2 Forward step ups 8" c opp arm lifts x10  [facility] Adult PT Treatment:                                                DATE: 12/11/22 Therapeutic Exercise: 15# 10 x 2 lifting from waist/ counter to shoulder height shelf  Palloff press side steps Green band x 10 each side Palloff rotation with GTB x15 each side Standing alt UE lifts 10 x 2, 8# then 10#, waist to head height Waist level lift with turn to above shoulder lift 15#, each side L stretch at counter, forward and laterally  [facility] Adult PT Treatment:                                                DATE: 11/27/22 Therapeutic Exercise: 15# 10  x 2 lifting from waist/ counter to shoulder height shelf  Palloff press Green band x 10 each  Standing alt UE/LE lifts  10 x 2, 8# then 10# L stretch at counter Standing side bending AROM x 5 each gentle Rom Standing modified open books at wall - hand on chest   PATIENT EDUCATION:  Education details: Eval  findings, POC, HEP, self care  Person educated: Patient Education method: Explanation, Demonstration, Tactile cues, Verbal cues, and Handouts Education comprehension: verbalized understanding, returned demonstration, verbal cues required, tactile cues required, and needs further education  HOME EXERCISE PROGRAM: Access Code: [email or web address] URL: [email or web address] Date: 09/18/2022 Prepared by: Joellyn Rued  Exercises - Standing 'L' Stretch at Counter  - 2 x daily - 7 x weekly - 1 sets - 10 reps - 5-20 hold - Shoulder Flexion Wall Slide with Towel  - 2 x daily - 7 x weekly - 1 sets - 10 reps - 5=10 hold - Supine Posterior Pelvic Tilt  - 2 x daily - 7 x weekly - 1 sets - 10 reps - 3 hold - Supine Shoulder Flexion Extension AAROM with Dowel  - 2 x daily - 7 x weekly - 1 sets - 10 reps - Standing Anti-Rotation Press with Anchored Resistance  - 2 x daily - 7 x weekly - 1 sets - 10 reps - 3 hold - Seated Upper Trapezius Stretch  - 1 x daily - 7 x weekly - 1 sets - 3 reps - 15-30 hold - Gentle Levator Scapulae Stretch  - 1 x daily - 7 x weekly - 1 sets - 3 reps - 15-30 hold - Hooklying Rib Cage Breathing  - 2 x daily - 7 x weekly - 2 sets - 5 reps - 5 hold - Supine March  - 2 x daily - 7 x weekly - 3 sets - 10 reps - Hooklying Isometric Hip Flexion  - 1 x daily - 7 x weekly - 2 sets - 5 reps - 5 hold - Hooklying Isometric Hip Flexion with Opposite Arm  - 1 x daily - 7 x weekly - 2 sets - 5 reps - 5 hold  ASSESSMENT:  CLINICAL IMPRESSION: Pt arrives reporting improved fluidity of movement, less robotic feeling. She continues to have 5-6/10 pain with driving, sweeping, lifting and carrying groceries, and pushing grocery cart. Her resting pain is 3-4/10 in right upper trap, right periscapular area and right upper abdominal area. Continues to wake at night due to pain. Her shoulder AROM has improved, partially meeting LTG#2. Instructed pt in AAROM using wall slides to further improved  shoulder ROM. Also instructed her in sidelying shoulder abduction AROM with good tolerance. Pt continues to work on HEP for core stability. She continues to have pain with moving weight away from body but tolerates over head lifting with improved ROM. Overall she is making slow but steady progress with PT. 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 Pt tolerated PT today without adverse effects   OBJECTIVE IMPAIRMENTS: decreased activity tolerance, decreased ROM, decreased strength, increased muscle spasms, and pain.   ACTIVITY LIMITATIONS: carrying, lifting, dressing, reach over head, and caring for others  PARTICIPATION LIMITATIONS: meal prep, cleaning, laundry, driving, occupation, and yard work  PERSONAL FACTORS: Time since onset of injury/illness/exacerbation are also affecting patient's  functional outcome.   REHAB POTENTIAL: Excellent  CLINICAL DECISION MAKING: Stable/uncomplicated  EVALUATION COMPLEXITY: Low   GOALS:  SHORT TERM GOALS: Target date: 09/26/22  Pt will be Ind in an initial HEP  Baseline: started Goal status: MET 2.  Pt will voice understanding of measures to assist in pain reduction  Baseline: started 09/25/22: use of cold pack, moist heat, knee bolster for sleep position Goal status: MET   LONG TERM GOALS: Target date: 11/07/22  Pt will be Ind in a final HEP to maintain achieved LOF  Baseline: started Goal status: ONGOING  2.  Increase R shoulder flexion and abd to 145d for appropriate upper body function Baseline: 100d for both 10/07/22: Flex 130; abd 120 11/14/22: Flex 140; abd 128 12/05/22: abd 138 12/23/22: 145 flexion Goal status: PARTIALLY MET   3.  Pt will report a decrease in pain to 0-3/10 with daily and work related activities form improved QOL and to return back  to work Baseline: 3-8/10 See pain scale above 12/23/22: 3-4/10 rest, up to 5-6/10 with activity Goal status: ONGOING  4.  Increase R shoulder strength to 4+/5 or greater for improved R UE function for return to work  Baseline: See flow sheets 11/03/22: see chart, partially me 11/14/22: see flow sheet Goal status: MET  5.  Pt will be able to handle 15#, lifting from waist to shoulder height 2 set of 10  Baseline: not tested 10/14/22: 3# and 5# waist to shoulder height x10 each 11/03/22: can lift 15# dumb bell using both UE from counter to should height shelf 1 x 10 12/05/22: can lift 15# dumb bell using both UE from counter to should height shelf 2 x 10 Goal status: Improved- MET  6.  Pt will be able to handle 75#, lifting from floor to waist height  2 sets of 10 Baseline: not tested 10/17/22: 10# hinged hip lifting 11/03/22: 25 # hinged hip lifting  12/23/22: 25# hinged hip lifting  Goal status: Ongoing  7.  Pt's FOTO score will improved to the predicted value of 57% as indication of improved function  Baseline: 31% 10/09/22: 38%  11/14/22: 42% 12/23/22: 43% Goal status: Ongoing  PLAN:  PT FREQUENCY: 2x/week  PT DURATION: 6 weeks  PLANNED INTERVENTIONS: Therapeutic exercises, Therapeutic activity, Patient/Family education, Self Care, Joint mobilization, Dry Needling, Electrical stimulation, Cryotherapy, Moist heat, Taping, Ultrasound, Ionotophoresis 4mg /ml Dexamethasone, Manual therapy, and Re-evaluation.  PLAN FOR NEXT SESSION: Review FOTO; assess response to HEP; progress therex as indicated; use of modalities, manual therapy; and TPDN as indicated.  Check all possible CPT codes: 16109 - PT Re-evaluation, 97110- Therapeutic Exercise, 97140 - Manual Therapy, 97530 - Therapeutic Activities, 97535 - Self Care, [phone] - Electrical stimulation (Manual), [id number] - Ultrasound, and [id number] - Aquatic therapy                         Check all conditions that are expected to impact treatment:  {Conditions expected to impact treatment:Morbid obesity and Musculoskeletal disorders      Jannette Spanner, PTA 12/23/22 12:58 PM Phone: [phone] Fax: [phone]

## 2023-01-01 ENCOUNTER — Encounter: Payer: Self-pay | Admitting: Internal Medicine
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
## 2023-01-06 ENCOUNTER — Ambulatory Visit: Payer: BC Managed Care – PPO | Attending: Internal Medicine

## 2023-01-06 DIAGNOSIS — M546 Pain in thoracic spine: Secondary | ICD-10-CM | POA: Insufficient documentation

## 2023-01-06 DIAGNOSIS — R252 Cramp and spasm: Secondary | ICD-10-CM | POA: Diagnosis not present

## 2023-01-13 ENCOUNTER — Ambulatory Visit: Payer: BC Managed Care – PPO | Admitting: Physical Therapy

## 2023-01-13 DIAGNOSIS — R252 Cramp and spasm: Secondary | ICD-10-CM

## 2023-01-13 DIAGNOSIS — M546 Pain in thoracic spine: Secondary | ICD-10-CM

## 2023-01-13 NOTE — Therapy (Signed)
OUTPATIENT PHYSICAL THERAPY THORACOLUMBAR TREATMENT NOTE   Patient Name: Kristen [name] MRN: [id number] DOB:30-Oct-1980, 42 y.o., female Today's Date: 01/13/2023  END OF SESSION:  PT End of Session - 01/13/23 1149     Visit Number 20    Number of Visits 30    Date for PT Re-Evaluation 01/16/23    Authorization Type [employer] COMM PPO/ [employer] [place] [employer]    Authorization Time Period 12/29/22-01/27/23    Authorization - Visit Number 2    Authorization - Number of Visits 4    PT Start Time 1145    PT Stop Time 1230    PT Time Calculation (min) 45 min                Past Medical History:  Diagnosis Date   Alpha thalassemia (HCC)    Asthma    uses inhaler daily/as needed   Complication of anesthesia    reaction to medication in epidural -morphine   Endometriosis    Preterm labor with preterm delivery    Seasonal allergies    Tubular adenoma of colon    Past Surgical History:  Procedure Laterality Date   CESAREAN SECTION     CESAREAN SECTION N/A 10/29/2015   Procedure: CESAREAN SECTION;  Surgeon: Adam Phenix, MD;  Location: [facility] BIRTHING SUITES;  Service: Obstetrics;  Laterality: N/A;   DILATION AND CURETTAGE OF UTERUS     WISDOM TOOTH EXTRACTION  2014   Patient Active Problem List   Diagnosis Date Noted   Headache 12/02/2022   Nausea 12/02/2022   Sprain of abdominal wall 09/23/2022   Vitamin D deficiency 07/10/2022   Microcytosis 07/10/2022   Alpha thalassemia (HCC) 07/10/2022   Obesity (BMI 30.0-34.9) 07/09/2022   Hypnagogic hallucinations 10/22/2016   Gestational diabetes mellitus (GDM) affecting fifth pregnancy 10/22/2016   Unwanted fertility 09/17/2016   Bowel trouble 02/07/2016   Constipation 02/07/2016    PCP: Philip Aspen, Limmie Patricia, MD   REFERRING PROVIDER: Philip Aspen, Limmie Patricia, MD   REFERRING DIAG: [phone] (ICD-10-CM) - Strain of abdominal wall, initial encounter   Rationale for Evaluation and Treatment:  Rehabilitation  THERAPY DIAG:  Pain in thoracic spine  Cramp and spasm  ONSET DATE: June 14th, 2024  SUBJECTIVE:                                                                                                                                                                                           SUBJECTIVE STATEMENT: 01/13/23: Pt reports her abdominal pain is hurting the least, while her upper shoulder and periscap area are hurting constantly.   EVAL: Pt reports she strained her  R ant/lat chest/abdominal area when lifting a package at [employer]. Pt notes the pain can radiate to her R back and her R upper shoulder hurts as well. At the time of the injury, the pt states the pain was intense and she felt like she was going to throw up. Pt indicates there was an area of swelling which ran along her ant/lat chest from under her R breast to her last rib. Following the injury, she worked on [employer] duty (small sort) for 1 week. Currently she is out of work on [employer]. She can return to work on light duty when she can tolerate lifting 15 lbs for 3.5 hours. In addition to pain, pt reports muscle spasms in the area. Pt has 6 children.  Pt is Rt handed  PERTINENT HISTORY:  NA  PAIN:  Are you having pain? Yes: NPRS scale: R upper trap 3-4/10 Pain location: R ant/lat chest/abdominal area, radiates to R back, R upper shoulder Pain description: stab in scap, upper trap aches, abdomen burns Aggravating factors: HEP, driving, sweeping, pushing large grocery cart  Relieving factors: Cold pack, hot bath soaks Pain range on eval: 3-8/10; pain range 12/23/22: 3-6/10  PRECAUTIONS: None  WEIGHT BEARING RESTRICTIONS: No  FALLS:  Has patient fallen in last 6 months? 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 Pt's need to be  able to lift 75# to return to full duty  PLOF: Independent  PATIENT GOALS: Pain relief and to return to work  OBJECTIVE:   DIAGNOSTIC FINDINGS:  NA  PATIENT SURVEYS:  FOTO: Perceived function   31%, predicted   57%  FOTO 38% 10/09/22 FOTO 42% 11/14/22 FOTO 43% 12/23/22  SCREENING FOR RED FLAGS: Bowel or bladder incontinence:   COGNITION: Overall cognitive status: Within functional limits for tasks assessed     SENSATION: WFL  MUSCLE LENGTH: Hamstrings: Right NT deg; Left NT deg Maisie Fus test: Right NT deg; Left NT deg  POSTURE: rounded shoulders and forward head  PALPATION: TTP to the ant/lat R rib cage from the 5th to 12th ribs and of the R upper trap  UE MMT:  MMT Right 09/04/22 Left 09/04/22 Right  11/03/22 Rt 11/14/22  Shoulder flexion 4p 5 4 4+  Shoulder extension 4p 5    Shoulder abduction 4p 5 4 4+  Shoulder adduction 4p 5    Shoulder extension 4p 5    Shoulder internal rotation 4p 5 5   Shoulder external rotation 4p 5 4+ p in right side 4+ p rt side  Elbow flexion 5 5    Elbow extension 5 5    Wrist flexion      Wrist extension      Wrist ulnar deviation      Wrist radial deviation      Wrist pronation      Wrist supination       (Blank rows = not tested) P=denotes concordant pain  UE ROM:  AROM Right 09/04/22 Left 09/04/22 RT 10/07/22 RT 11/03/22 RT 11/14/22 RT 12/05/22 RT 12/23/22 RT 01/13/23  Shoulder flexion 100 150 130 128 140d p 132 A 145d  145  Shoulder extension          Shoulder abduction 100 150 120 120 128d p 138 A 140d 145  Shoulder adduction          Shoulder extension  Shoulder internal rotation          Shoulder external rotation          Middle trapezius          Lower trapezius          Elbow flexion          Elbow extension          Wrist flexion          Wrist extension          Wrist ulnar deviation          Wrist radial deviation          Wrist pronation          Wrist supination          Grip strength            (Blank rows = not tested)    P=denotes concordant pain  TODAY'S TREATMENT: [facility] Adult PT Treatment:                                                DATE: 01/13/23 Therapeutic Exercise: UBE Level 2,  2 min  each way  Standing row Green band x 15 - increased pain Supine serratus dowel lifts x 15  Supine alternating diagonals  red  x 15  Supine horiz abdct Red x 15  Prone retraction with extension x 10 Prone W back x 10 Reviewed and condensed HEP    [facility] Adult PT Treatment:                                                DATE: 01/06/23 Therapeutic Exercise: UBE 2 mins fwd and bwd Supine chin tuck x10 3' Supine DNF lift offs x10 5" Cervical SB 2x 15 sec c gentle pt over pressure Cervical rot 2x 15 sec c gentle pt over pressure Self Care: Pt Ed for the use of theracare and tennis c wall massage to addressTrPs and taut muscle bands Trigger Point Dry Needling Treatment: Pre-treatment instruction: Patient instructed on dry needling rationale, procedures, and possible side effects including pain during treatment (achy,cramping feeling), bruising, drop of blood, lightheadedness, nausea, sweating. Patient Consent Given: Yes Education handout provided: Yes Muscles treated: R upper  trap  Needle size and number: .30x38mm x 1 Electrical stimulation performed: No Parameters: N/A Treatment response/outcome: Twitch response elicited and Palpable decrease in muscle tension Post-treatment instructions: Patient instructed to expect possible mild to moderate muscle soreness later today and/or tomorrow. Patient instructed in methods to reduce muscle soreness and to continue prescribed HEP. If patient was dry needled over the lung field, patient was instructed on signs and symptoms of pneumothorax and, however unlikely, to see immediate medical attention should they occur. Patient was also educated on signs and symptoms of infection and to seek medical attention should they occur. Patient verbalized  understanding of these instructions and education.   [facility] Adult PT Treatment:                                                DATE: 12/23/22  Therapeutic Exercise: UT and levator stretches adding overpressure  Wall slides for right shoulder flexion and abduction x 10 each S/L shoulder abduction and flexion x 10 each Open book x 10 each Therapeutic Activity: AROM/ FOTO outcome measure Update of goal status  [facility] Adult PT Treatment:                                                DATE: 12/16/22 Therapeutic Exercise: Serratus push up at wall x10 L stretch at counter, forward and laterally Standing alt UE lifts 10 x 2, 7#, waist to head height Marching on airex c UEs 90d c 3# wts Doorway ITB/QL stretches Banded Lateral steps c BluTB 20' x4 High knee steps c opp arm lifts and heel lifts 20' x 2 Forward step ups 8" c opp arm lifts x10  PATIENT EDUCATION:  Education details: Eval findings, POC, HEP, self care  Person educated: Patient Education method: Explanation, Demonstration, Tactile cues, Verbal cues, and Handouts Education comprehension: verbalized understanding, returned demonstration, verbal cues required, tactile cues required, and needs further education  HOME EXERCISE PROGRAM: Access Code: [email or web address] URL: [email or web address] Date: 01/06/2023 Prepared by: Joellyn Rued  Exercises - Standing 'L' Stretch at Counter  - 2 x daily - 7 x weekly - 1 sets - 10 reps - 5-20 hold - Shoulder Flexion Wall Slide with Towel  - 2 x daily - 7 x weekly - 1 sets - 10 reps - 5=10 hold - Supine Posterior Pelvic Tilt  - 2 x daily - 7 x weekly - 1 sets - 10 reps - 3 hold - Supine Shoulder Flexion Extension AAROM with Dowel  - 2 x daily - 7 x weekly - 1 sets - 10 reps - Standing Anti-Rotation Press with Anchored Resistance  - 2 x daily - 7 x weekly - 1 sets - 10 reps - 3 hold - Seated Upper Trapezius Stretch  - 1 x daily - 7 x weekly - 1 sets - 3 reps - 15-30 hold - Gentle Levator  Scapulae Stretch  - 1 x daily - 7 x weekly - 1 sets - 3 reps - 15-30 hold - Hooklying Rib Cage Breathing  - 2 x daily - 7 x weekly - 2 sets - 5 reps - 5 hold - Hooklying Isometric Hip Flexion  - 1 x daily - 7 x weekly - 2 sets - 5 reps - 5 hold - Hooklying Isometric Hip Flexion with Opposite Arm  - 1 x daily - 7 x weekly - 2 sets - 5 reps - 5 hold - Standing March with Alternating [facility]  - 1 x daily - 7 x weekly - 3 sets - 10 reps - Supine Cervical Retraction with Towel  - 1 x daily - 7 x weekly - 1 sets - 10 reps - 3 hold - Supine Deep Neck Flexor Training - Repetitions  - 1 x daily - 7 x weekly - 1 sets - 10 reps - 5 hold - Seated Cervical Sidebending Stretch  - 1 x daily - 7 x weekly - 2 sets - 10 reps - 15 hold - Standing Cervical Rotation AROM with Overpressure (Mirrored)  - 1 x daily - 7 x weekly - 2 sets - 10 reps - 15 hold  ASSESSMENT:  CLINICAL IMPRESSION: Pt reports dry needling helped to reduce the tension in  her upper trap however the pain continues. She has continued upper shoulder and periscapular pain. Her right abdominal pain is much improved compared to beginning PT, 2-3/10 and intermittent. She is still limited with driving using her right arm due to pain. She can complete household activities but with increased shoulder pain. Right shoulder abduction AROM improved to target. LTG# 2 met.  Targeted mid and lower traps today to decrease upper trap activation. HEP was streamlined. She would benefit from continuing PT to reduced limitations with left shoulder and monitor symptoms as she returns to light duty work. She is also interested in additional TPDN. Pt tolerated PT today without adverse effects. Pt will continue to benefit from skilled PT to address impairments for improved functional mobility and less pain in prep for return to work.   OBJECTIVE IMPAIRMENTS: decreased activity tolerance, decreased ROM, decreased strength, increased muscle spasms, and pain.   ACTIVITY  LIMITATIONS: carrying, lifting, dressing, reach over head, and caring for others  PARTICIPATION LIMITATIONS: meal prep, cleaning, laundry, driving, occupation, and yard work  PERSONAL FACTORS: Time since onset of injury/illness/exacerbation are also affecting patient's functional outcome.   REHAB POTENTIAL: Excellent  CLINICAL DECISION MAKING: Stable/uncomplicated  EVALUATION COMPLEXITY: Low   GOALS:  SHORT TERM GOALS: Target date: 09/26/22  Pt will be Ind in an initial HEP  Baseline: started Goal status: MET 2.  Pt will voice understanding of measures to assist in pain reduction  Baseline: started 09/25/22: use of cold pack, moist heat, knee bolster for sleep position Goal status: MET   LONG TERM GOALS: Target date: 11/07/22  Pt will be Ind in a final HEP to maintain achieved LOF  Baseline: started Goal status: ONGOING  2.  Increase R shoulder flexion and abd to 145d for appropriate upper body function Baseline: 100d for both 10/07/22: Flex 130; abd 120 11/14/22: Flex 140; abd 128 12/05/22: abd 138 12/23/22: 145 flexion 01/13/23: 145 flexion and abduction Goal status: MET   3.  Pt will report a decrease in pain to 0-3/10 with daily and work related activities form improved QOL and to return back to work Baseline: 3-8/10 See pain scale above 12/23/22: 3-4/10 rest, up to 5-6/10 with activity 01/13/23: 3 or less for abdomen Goal status: MET for abdominal pain, Not met for shoulder   4.  Increase R shoulder strength to 4+/5 or greater for improved R UE function for return to work  Baseline: See flow sheets 11/03/22: see chart, partially me 11/14/22: see flow sheet Goal status: MET  5.  Pt will be able to handle 15#, lifting from waist to shoulder height 2 set of 10  Baseline: not tested 10/14/22: 3# and 5# waist to shoulder height x10 each 11/03/22: can lift 15# dumb bell using both UE from counter to should height shelf 1 x 10 12/05/22: can lift 15# dumb bell using both UE  from counter to should height shelf 2 x 10 Goal status: Improved- MET  6.  Pt will be able to handle 75#, lifting from floor to waist height  2 sets of 10 Baseline: not tested 10/17/22: 10# hinged hip lifting 11/03/22: 25 # hinged hip lifting  12/23/22: 25# hinged hip lifting  Goal status: Ongoing  7.  Pt's FOTO score will improved to the predicted value of 57% as indication of improved function  Baseline: 31% 10/09/22: 38%  11/14/22: 42% 12/23/22: 43% Goal status: Ongoing  PLAN:  PT FREQUENCY: 2x/week  PT DURATION: 6 weeks  PLANNED INTERVENTIONS: Therapeutic exercises, Therapeutic  activity, Patient/Family education, Self Care, Joint mobilization, Dry Needling, Electrical stimulation, Cryotherapy, Moist heat, Taping, Ultrasound, Ionotophoresis 4mg /ml Dexamethasone, Manual therapy, and Re-evaluation.  PLAN FOR NEXT SESSION: TPDN PRN, scapular stabilization  Check all possible CPT codes: 16109 - PT Re-evaluation, 97110- Therapeutic Exercise, 97140 - Manual Therapy, 97530 - Therapeutic Activities, 97535 - Self Care, [phone] - Electrical stimulation (Manual), [id number] - Ultrasound, and [id number] - Aquatic therapy                         Check all conditions that are expected to impact treatment: {Conditions expected to impact treatment:Morbid obesity and Musculoskeletal disorders     Jannette Spanner, PTA 01/13/23 1:02 PM Phone: [phone] Fax: [phone]

## 2023-01-14 ENCOUNTER — Ambulatory Visit: Payer: BC Managed Care – PPO | Admitting: Internal Medicine

## 2023-01-14 ENCOUNTER — Encounter: Payer: Self-pay | Admitting: Internal Medicine

## 2023-01-14 VITALS — BP 130/88 | HR 79 | Temp 98.2°F | Wt 219.8 lb

## 2023-01-14 DIAGNOSIS — S39011D Strain of muscle, fascia and tendon of abdomen, subsequent encounter: Secondary | ICD-10-CM | POA: Diagnosis not present

## 2023-01-14 NOTE — Progress Notes (Signed)
Established Patient Office Visit     CC/Reason for Visit: Abdominal wall muscle strain  HPI: Kristen [name] is a 42 y.o. female who is coming in today for the above mentioned reasons.  She has been dealing with this over the past few months.  She is progressing well with physical therapy.  She is not ready to return to work and needs a form and a letter signed to this effect.   Past Medical/Surgical History: Past Medical History:  Diagnosis Date   Alpha thalassemia (HCC)    Asthma    uses inhaler daily/as needed   Complication of anesthesia    reaction to medication in epidural -morphine   Endometriosis    Preterm labor with preterm delivery    Seasonal allergies    Tubular adenoma of colon     Past Surgical History:  Procedure Laterality Date   CESAREAN SECTION     CESAREAN SECTION N/A 10/29/2015   Procedure: CESAREAN SECTION;  Surgeon: Adam Phenix, MD;  Location: [facility] BIRTH[facility] SUITES;  Service: Obstetrics;  Laterality: N/A;   DILATION AND CURETTAGE OF UTERUS     WISDOM TOOTH EXTRACTION  2014    Social History:  reports that she has quit smoking. Her smoking use included cigars. She has never been exposed to tobacco smoke. She has never used smokeless tobacco. She reports that she does not drink alcohol and does not use drugs.  Allergies: Allergies  Allergen Reactions   Hydrocodone Shortness Of Breath   Morphine And Codeine Shortness Of Breath and Itching    Burning   Penicillins Hives    Has patient had a PCN reaction causing immediate rash, facial/tongue/throat swelling, SOB or lightheadedness with hypotension: Yes Has patient had a PCN reaction causing severe rash involving mucus membranes or skin necrosis: Yes Has patient had a PCN reaction that required hospitalization: No Has patient had a PCN reaction occurring within the last 10 years: Yes If all of the above answers are "NO", then may proceed with Cephalosporin use.    Percocet  [Oxycodone-Acetaminophen] Anaphylaxis    Pt has taken hydromorphone without complications in the past    Family History:  Family History  Problem Relation Age of Onset   HIV Father    Colon cancer Maternal Uncle    Diabetes Maternal Grandmother    Diabetes Paternal Grandmother    Stomach cancer Neg Hx    Rectal cancer Neg Hx    Esophageal cancer Neg Hx    Liver cancer Neg Hx    Colon polyps Neg Hx      Current Outpatient Medications:    albuterol (VENTOLIN HFA) 108 (90 Base) MCG/ACT inhaler, Inhale 2 puffs into the lungs every 4 (four) hours as needed for wheezing or shortness of breath., Disp: 18 g, Rfl: 1   cetirizine (ZYRTEC) 10 MG tablet, Take 1 tablet (10 mg total) by mouth daily., Disp: 30 tablet, Rfl: 11   fluticasone (FLONASE) 50 MCG/ACT nasal spray, Place 2 sprays into both nostrils daily., Disp: 9.9 mL, Rfl: 3   triamcinolone ointment (KENALOG) 0.5 %, Apply 1 Application topically 2 (two) times daily., Disp: 30 g, Rfl: 3   VITAMIN D PO, Take 1 tablet by mouth daily at 6 (six) AM., Disp: , Rfl:   Review of Systems:  Negative unless indicated in HPI.   Physical Exam: Vitals:   01/14/23 0944  BP: 130/88  Pulse: 79  Temp: 98.2 F (36.8 C)  TempSrc: Oral  SpO2: 98%  Weight: 219 lb 12.8 oz (99.7 kg)    Body mass index is 38.33 kg/m.   Physical Exam Vitals reviewed.  Constitutional:      Appearance: Normal appearance.  HENT:     Head: Normocephalic and atraumatic.  Eyes:     Conjunctiva/sclera: Conjunctivae normal.     Pupils: Pupils are equal, round, and reactive to light.  Skin:    General: Skin is warm and dry.  Neurological:     General: No focal deficit present.     Mental Status: She is alert and oriented to person, place, and time.  Psychiatric:        Mood and Affect: Mood normal.        Behavior: Behavior normal.        Thought Content: Thought content normal.        Judgment: Judgment normal.      Impression and Plan:  Strain of  abdominal wall, subsequent encounter  -Okay to return to work on [employer] duty, letter sent to that effect as well as work form filled out for disability.   Time spent:20 minutes reviewing chart, interviewing and examining patient and formulating plan of care.     Chaya Jan, MD [facility]

## 2023-01-20 ENCOUNTER — Encounter: Payer: Self-pay | Admitting: Internal Medicine

## 2023-01-20 ENCOUNTER — Ambulatory Visit: Payer: BC Managed Care – PPO

## 2023-01-20 DIAGNOSIS — R252 Cramp and spasm: Secondary | ICD-10-CM | POA: Diagnosis not present

## 2023-01-20 DIAGNOSIS — M546 Pain in thoracic spine: Secondary | ICD-10-CM | POA: Diagnosis not present

## 2023-01-20 NOTE — Addendum Note (Signed)
Addended by: Joellyn Rued on: 01/20/2023 02:38 PM   Modules accepted: Orders

## 2023-01-20 NOTE — Therapy (Signed)
OUTPATIENT PHYSICAL THERAPY THORACOLUMBAR TREATMENT NOTE/Re-Cert   Patient Name: Kristen [name] MRN: [id number] DOB:[birth year], 42 y.o., female Today's Date: 01/20/2023  END OF SESSION:  PT End of Session - 01/20/23 1139     Visit Number 21    Number of Visits 30    Date for PT Re-Evaluation 03/13/23    Authorization Type [employer] COMM PPO/ [employer] [place] [employer]    Authorization Time Period 12/29/22-01/27/23    Authorization - Visit Number 3    Authorization - Number of Visits 4    PT Start Time 1100    PT Stop Time 1145    PT Time Calculation (min) 45 min    Activity Tolerance Patient tolerated treatment well    Behavior During Therapy WFL for tasks assessed/performed                 Past Medical History:  Diagnosis Date   Alpha thalassemia (HCC)    Asthma    uses inhaler daily/as needed   Complication of anesthesia    reaction to medication in epidural -morphine   Endometriosis    Preterm labor with preterm delivery    Seasonal allergies    Tubular adenoma of colon    Past Surgical History:  Procedure Laterality Date   CESAREAN SECTION     CESAREAN SECTION N/A 10/29/2015   Procedure: CESAREAN SECTION;  Surgeon: Adam Phenix, MD;  Location: [facility] BIRTHING SUITES;  Service: Obstetrics;  Laterality: N/A;   DILATION AND CURETTAGE OF UTERUS     WISDOM TOOTH EXTRACTION  2014   Patient Active Problem List   Diagnosis Date Noted   Headache 12/02/2022   Nausea 12/02/2022   Sprain of abdominal wall 09/23/2022   Vitamin D deficiency 07/10/2022   Microcytosis 07/10/2022   Alpha thalassemia (HCC) 07/10/2022   Obesity (BMI 30.0-34.9) 07/09/2022   Hypnagogic hallucinations 10/22/2016   Gestational diabetes mellitus (GDM) affecting fifth pregnancy 10/22/2016   Unwanted fertility 09/17/2016   Bowel trouble 02/07/2016   Constipation 02/07/2016    PCP: Philip Aspen, Limmie Patricia, MD   REFERRING PROVIDER: Philip Aspen, Limmie Patricia, MD   REFERRING DIAG:  [phone] (ICD-10-CM) - Strain of abdominal wall, initial encounter   Rationale for Evaluation and Treatment: Rehabilitation  THERAPY DIAG:  Pain in thoracic spine - Plan: PT plan of care cert/re-cert  Cramp and spasm - Plan: PT plan of care cert/re-cert  ONSET DATE: June 14th, 2024  SUBJECTIVE:  SUBJECTIVE STATEMENT: 01/20/23: Pt reports she was to return to work yesterday, but her work stated she needs to be able to work at full duty. Pt states she wants to return to work with the confidence her body can complete the demands of her work safely. Pt states she did a lot of sitting at a church conference last weekend and her core is sore.  EVAL: Pt reports she strained her R ant/lat chest/abdominal area when lifting a package at [employer]. Pt notes the pain can radiate to her R back and her R upper shoulder hurts as well. At the time of the injury, the pt states the pain was intense and she felt like she was going to throw up. Pt indicates there was an area of swelling which ran along her ant/lat chest from under her R breast to her last rib. Following the injury, she worked on [employer] duty (small sort) for 1 week. Currently she is out of work on [employer]. She can return to work on light duty when she can tolerate lifting 15 lbs for 3.5 hours. In addition to pain, pt reports muscle spasms in the area. Pt has 6 children.  Pt is Rt handed  PERTINENT HISTORY:  NA  PAIN:  Are you having pain? Yes: NPRS scale: R upper trap 5/10. R abdominals4/10 Pain location: R ant/lat chest/abdominal area, radiates to R back, R upper shoulder Pain description: stab in scap, upper trap aches, abdomen burns Aggravating factors: HEP, driving, sweeping, pushing large grocery cart  Relieving factors: Cold pack, hot bath soaks Pain  range on eval: 3-8/10; pain range 12/23/22: 3-6/10  PRECAUTIONS: None  WEIGHT BEARING RESTRICTIONS: No  FALLS:  Has patient fallen in last 6 months? 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 Pt's need to be able to lift 75# to return to full duty  PLOF: Independent  PATIENT GOALS: Pain relief and to return to work  OBJECTIVE:   DIAGNOSTIC FINDINGS:  NA  PATIENT SURVEYS:  FOTO: Perceived function   31%, predicted   57%  FOTO 38% 10/09/22 FOTO 42% 11/14/22 FOTO 43% 12/23/22  SCREENING FOR RED FLAGS: Bowel or bladder incontinence:   COGNITION: Overall cognitive status: Within functional limits for tasks assessed     SENSATION: WFL  MUSCLE LENGTH: Hamstrings: Right NT deg; Left NT deg Maisie Fus test: Right NT deg; Left NT deg  POSTURE: rounded shoulders and forward head  PALPATION: TTP to the ant/lat R rib cage from the 5th to 12th ribs and of the R upper trap  UE MMT:  MMT Right 09/04/22 Left 09/04/22 Right  11/03/22 Rt 11/14/22  Shoulder flexion 4p 5 4 4+  Shoulder extension 4p 5    Shoulder abduction 4p 5 4 4+  Shoulder adduction 4p 5    Shoulder extension 4p 5    Shoulder internal rotation 4p 5 5   Shoulder external rotation 4p 5 4+ p in right side 4+ p rt side  Elbow flexion 5 5    Elbow extension 5 5    Wrist flexion      Wrist extension      Wrist ulnar deviation      Wrist radial deviation      Wrist pronation      Wrist supination       (Blank rows = not tested) P=denotes concordant  pain  UE ROM:  AROM Right 09/04/22 Left 09/04/22 RT 10/07/22 RT 11/03/22 RT 11/14/22 RT 12/05/22 RT 12/23/22 RT 01/13/23  Shoulder flexion 100 150 130 128 140d p 132 A 145d  145  Shoulder extension          Shoulder abduction 100 150 120 120 128d p 138 A 140d 145  Shoulder adduction           Shoulder extension          Shoulder internal rotation          Shoulder external rotation          Middle trapezius          Lower trapezius          Elbow flexion          Elbow extension          Wrist flexion          Wrist extension          Wrist ulnar deviation          Wrist radial deviation          Wrist pronation          Wrist supination          Grip strength           (Blank rows = not tested)    P=denotes concordant pain  TODAY'S TREATMENT: [facility] Adult PT Treatment:                                                DATE: 01/20/23 Therapeutic Exercise: UBE 2 mins fwd and bwd Cervical SB 2x 15 sec c gentle pt over pressure Cervical rot 2x 15 sec c gentle pt over pressure R UE lifts to shoulder height 2x10 10# Hinged hip lifting 2x10 30#  Manual Therapy: STM to the R upper trap and mid back paraspinals Skilled palpation to identify TrPs and taut muscle bands Trigger Point Dry Needling Treatment: Pre-treatment instruction: Patient instructed on dry needling rationale, procedures, and possible side effects including pain during treatment (achy,cramping feeling), bruising, drop of blood, lightheadedness, nausea, sweating. Patient Consent Given: Yes Education handout provided: Yes Muscles treated: R upper trap, R paraspinals and multifidi T9-T10 Needle size and number: .30x13mm x 3 Electrical stimulation performed: No Parameters: N/A Treatment response/outcome: Twitch response elicited and Palpable decrease in muscle tension Post-treatment instructions: Patient instructed to expect possible mild to moderate muscle soreness later today and/or tomorrow. Patient instructed in methods to reduce muscle soreness and to continue prescribed HEP. If patient was dry needled over the lung field, patient was instructed on signs and symptoms of pneumothorax and, however unlikely, to see immediate medical attention should they occur. Patient was also educated on signs and symptoms  of infection and to seek medical attention should they occur. Patient verbalized understanding of these instructions and education.  [facility] Adult PT Treatment:                                                DATE: 01/13/23 Therapeutic Exercise: UBE Level 2,  2 min  each way  Standing row Green band x 15 - increased pain Supine serratus dowel lifts x  15  Supine alternating diagonals  red  x 15  Supine horiz abdct Red x 15  Prone retraction with extension x 10 Prone W back x 10 Reviewed and condensed HEP  [facility] Adult PT Treatment:                                                DATE: 01/06/23 Therapeutic Exercise: UBE 2 mins fwd and bwd Supine chin tuck x10 3' Supine DNF lift offs x10 5" Cervical SB 2x 15 sec c gentle pt over pressure Cervical rot 2x 15 sec c gentle pt over pressure Self Care: Pt Ed for the use of theracare and tennis c wall massage to addressTrPs and taut muscle bands Manual Therapy: STM to the R upper trap and mid back paraspinals Trigger Point Dry Needling Treatment: Pre-treatment instruction: Patient instructed on dry needling rationale, procedures, and possible side effects including pain during treatment (achy,cramping feeling), bruising, drop of blood, lightheadedness, nausea, sweating. Patient Consent Given: Yes Education handout provided: Yes Muscles treated: R upper  trap  Needle size and number: .30x45mm x 1 Electrical stimulation performed: No Parameters: N/A Treatment response/outcome: Twitch response elicited and Palpable decrease in muscle tension Post-treatment instructions: Patient instructed to expect possible mild to moderate muscle soreness later today and/or tomorrow. Patient instructed in methods to reduce muscle soreness and to continue prescribed HEP. If patient was dry needled over the lung field, patient was instructed on signs and symptoms of pneumothorax and, however unlikely, to see immediate medical attention should they occur. Patient was also  educated on signs and symptoms of infection and to seek medical attention should they occur. Patient verbalized understanding of these instructions and education.   [facility] Adult PT Treatment:                                                DATE: 12/23/22 Therapeutic Exercise: UT and levator stretches adding overpressure  Wall slides for right shoulder flexion and abduction x 10 each S/L shoulder abduction and flexion x 10 each Open book x 10 each Therapeutic Activity: AROM/ FOTO outcome measure Update of goal status  [facility] Adult PT Treatment:                                                DATE: 12/16/22 Therapeutic Exercise: Serratus push up at wall x10 L stretch at counter, forward and laterally Standing alt UE lifts 10 x 2, 7#, waist to head height Marching on airex c UEs 90d c 3# wts Doorway ITB/QL stretches Banded Lateral steps c BluTB 20' x4 High knee steps c opp arm lifts and heel lifts 20' x 2 Forward step ups 8" c opp arm lifts x10  PATIENT EDUCATION:  Education details: Eval findings, POC, HEP, self care  Person educated: Patient Education method: Explanation, Demonstration, Tactile cues, Verbal cues, and Handouts Education comprehension: verbalized understanding, returned demonstration, verbal cues required, tactile cues required, and needs further education  HOME EXERCISE PROGRAM: Access Code: [email or web address] URL: [email or web address] Date: 01/06/2023 Prepared by: Joellyn Rued  Exercises - Standing 'L' Stretch at Counter  -  2 x daily - 7 x weekly - 1 sets - 10 reps - 5-20 hold - Shoulder Flexion Wall Slide with Towel  - 2 x daily - 7 x weekly - 1 sets - 10 reps - 5=10 hold - Supine Posterior Pelvic Tilt  - 2 x daily - 7 x weekly - 1 sets - 10 reps - 3 hold - Supine Shoulder Flexion Extension AAROM with Dowel  - 2 x daily - 7 x weekly - 1 sets - 10 reps - Standing Anti-Rotation Press with Anchored Resistance  - 2 x daily - 7 x weekly - 1 sets - 10 reps - 3 hold -  Seated Upper Trapezius Stretch  - 1 x daily - 7 x weekly - 1 sets - 3 reps - 15-30 hold - Gentle Levator Scapulae Stretch  - 1 x daily - 7 x weekly - 1 sets - 3 reps - 15-30 hold - Hooklying Rib Cage Breathing  - 2 x daily - 7 x weekly - 2 sets - 5 reps - 5 hold - Hooklying Isometric Hip Flexion  - 1 x daily - 7 x weekly - 2 sets - 5 reps - 5 hold - Hooklying Isometric Hip Flexion with Opposite Arm  - 1 x daily - 7 x weekly - 2 sets - 5 reps - 5 hold - Standing March with Alternating [facility]  - 1 x daily - 7 x weekly - 3 sets - 10 reps - Supine Cervical Retraction with Towel  - 1 x daily - 7 x weekly - 1 sets - 10 reps - 3 hold - Supine Deep Neck Flexor Training - Repetitions  - 1 x daily - 7 x weekly - 1 sets - 10 reps - 5 hold - Seated Cervical Sidebending Stretch  - 1 x daily - 7 x weekly - 2 sets - 10 reps - 15 hold - Standing Cervical Rotation AROM with Overpressure (Mirrored)  - 1 x daily - 7 x weekly - 2 sets - 10 reps - 15 hold  ASSESSMENT:  CLINICAL IMPRESSION: PT was completed for STM f/b TPDN to the R upper trap and mid back for pain modulation. Muscle twitch responses were elicited. Pt reported muscle tension was decreased following the TPDN. Pt then completed cervical mobility and simulated work lifting activities. Pt was able to tolerate an minimal increase with lifting demand. Pt tolerated PT today without adverse effects. Pt will continue to benefit from skilled PT 1w6 to address impairments for improved functional mobility with less pain for return to work.    OBJECTIVE IMPAIRMENTS: decreased activity tolerance, decreased ROM, decreased strength, increased muscle spasms, and pain.   ACTIVITY LIMITATIONS: carrying, lifting, dressing, reach over head, and caring for others  PARTICIPATION LIMITATIONS: meal prep, cleaning, laundry, driving, occupation, and yard work  PERSONAL FACTORS: Time since onset of injury/illness/exacerbation are also affecting patient's functional  outcome.   REHAB POTENTIAL: Excellent  CLINICAL DECISION MAKING: Stable/uncomplicated  EVALUATION COMPLEXITY: Low   GOALS:  SHORT TERM GOALS: Target date: 09/26/22  Pt will be Ind in an initial HEP  Baseline: started Goal status: MET 2.  Pt will voice understanding of measures to assist in pain reduction  Baseline: started 09/25/22: use of cold pack, moist heat, knee bolster for sleep position Goal status: MET   LONG TERM GOALS: Target date: 03/13/23  Pt will be Ind in a final HEP to maintain achieved LOF  Baseline: started Goal status: ONGOING  2.  Increase  R shoulder flexion and abd to 145d for appropriate upper body function Baseline: 100d for both 10/07/22: Flex 130; abd 120 11/14/22: Flex 140; abd 128 12/05/22: abd 138 12/23/22: 145 flexion 01/13/23: 145 flexion and abduction Goal status: MET   3.  Pt will report a decrease in pain to 0-3/10 with daily and work related activities form improved QOL and to return back to work Baseline: 3-8/10 See pain scale above 12/23/22: 3-4/10 rest, up to 5-6/10 with activity 01/13/23: 3 or less for abdomen Goal status: MET for abdominal pain, Not met for shoulder   4.  Increase R shoulder strength to 4+/5 or greater for improved R UE function for return to work  Baseline: See flow sheets 11/03/22: see chart, partially me 11/14/22: see flow sheet Goal status: MET  5.  Pt will be able to handle 15#, lifting from waist to shoulder height 2 set of 10  Baseline: not tested 10/14/22: 3# and 5# waist to shoulder height x10 each 11/03/22: can lift 15# dumb bell using both UE from counter to should height shelf 1 x 10 12/05/22: can lift 15# dumb bell using both UE from counter to should height shelf 2 x 10 01/20/23: 10# 2x10 R UE Goal status: Improved-not yet met for 1 hand. MET for 2 hands  6.  Pt will be able to handle 75#, lifting from floor to waist height  2 sets of 10 Baseline: not tested 10/17/22: 10# hinged hip lifting 11/03/22: 25 #  hinged hip lifting  12/23/22: 25# hinged hip lifting  01/20/23: 30# hinged hip lifting Goal status: Ongoing-Not yet met  7.  Pt's FOTO score will improved to the predicted value of 57% as indication of improved function  Baseline: 31% 10/09/22: 38%  11/14/22: 42% 12/23/22: 43% Goal status: Ongoing- Not yet met  PLAN:  PT FREQUENCY: 1x per week  PT DURATION: 6 weeks  PLANNED INTERVENTIONS: Therapeutic exercises, Therapeutic activity, Patient/Family education, Self Care, Joint mobilization, Dry Needling, Electrical stimulation, Cryotherapy, Moist heat, Taping, Ultrasound, Ionotophoresis 4mg /ml Dexamethasone, Manual therapy, and Re-evaluation.  PLAN FOR NEXT SESSION: TPDN PRN, scapular stabilization  Check all possible CPT codes: 16109 - PT Re-evaluation, 97110- Therapeutic Exercise, 97140 - Manual Therapy, 97530 - Therapeutic Activities, [phone] - Self Care, [phone] - Electrical stimulation (Manual), [id number] - Ultrasound, and [id number] - Aquatic therapy                         Check all conditions that are expected to impact treatment: {Conditions expected to impact treatment:Morbid obesity and Musculoskeletal disorders     Joellyn Rued MS, PT 01/20/23 2:33 PM

## 2023-01-30 ENCOUNTER — Ambulatory Visit: Payer: BC Managed Care – PPO | Attending: Internal Medicine

## 2023-01-30 ENCOUNTER — Telehealth: Payer: Self-pay

## 2023-01-30 DIAGNOSIS — R252 Cramp and spasm: Secondary | ICD-10-CM | POA: Insufficient documentation

## 2023-01-30 DIAGNOSIS — M546 Pain in thoracic spine: Secondary | ICD-10-CM | POA: Insufficient documentation

## 2023-01-30 NOTE — Telephone Encounter (Signed)
Spoke with pt by phone and she states she no showed by accident in that she thought she did not accept today's visit off the waiting list. Pt was reminded of her next PT appt.

## 2023-02-05 NOTE — Therapy (Signed)
OUTPATIENT PHYSICAL THERAPY THORACOLUMBAR TREATMENT NOTE/Re-Cert   Patient Name: Kristen [name] MRN: [id number] DOB:02-20-1981, 42 y.o., female Today's Date: 02/07/2023  END OF SESSION:  PT End of Session - 02/06/23 1106     Visit Number 22    Number of Visits 30    Date for PT Re-Evaluation 03/13/23    Authorization Type [employer] COMM PPO/ [employer] [place] Medicaid    Authorization Time Period 12/29/22-01/27/23    Authorization - Visit Number 4    Authorization - Number of Visits 4    PT Start Time 1105    PT Stop Time 1145    PT Time Calculation (min) 40 min    Activity Tolerance Patient tolerated treatment well    Behavior During Therapy WFL for tasks assessed/performed                  Past Medical History:  Diagnosis Date   Alpha thalassemia (HCC)    Asthma    uses inhaler daily/as needed   Complication of anesthesia    reaction to medication in epidural -morphine   Endometriosis    Preterm labor with preterm delivery    Seasonal allergies    Tubular adenoma of colon    Past Surgical History:  Procedure Laterality Date   CESAREAN SECTION     CESAREAN SECTION N/A 10/29/2015   Procedure: CESAREAN SECTION;  Surgeon: Adam Phenix, MD;  Location: [facility] BIRTHING SU[facility];  Service: Obstetrics;  Laterality: N/A;   DILATION AND CURETTAGE OF UTERUS     WISDOM TOOTH EXTRACTION  2014   Patient Active Problem List   Diagnosis Date Noted   Headache 12/02/2022   Nausea 12/02/2022   Sprain of abdominal wall 09/23/2022   Vitamin D deficiency 07/10/2022   Microcytosis 07/10/2022   Alpha thalassemia (HCC) 07/10/2022   Obesity (BMI 30.0-34.9) 07/09/2022   Hypnagogic hallucinations 10/22/2016   Gestational diabetes mellitus (GDM) affecting fifth pregnancy 10/22/2016   Unwanted fertility 09/17/2016   Bowel trouble 02/07/2016   Constipation 02/07/2016    PCP: Philip Aspen, Limmie Patricia, MD   REFERRING PROVIDER: Philip Aspen, Limmie Patricia, MD   REFERRING DIAG:  [phone] (ICD-10-CM) - Strain of abdominal wall, initial encounter   Rationale for Evaluation and Treatment: Rehabilitation  THERAPY DIAG:  Pain in thoracic spine  Cramp and spasm  ONSET DATE: June 14th, 2024  SUBJECTIVE:                                                                                                                                                                                           SUBJECTIVE STATEMENT: 02/06/23: Pt reports the TPDN significantly helped  her mid back pain. Pt states she has been working on being active continuously for 15 mins before needing to rest for approx 30-60 min. Approx 5 sessions a day. Pt states she is not to return back to work until February.  EVAL: Pt reports she strained her R ant/lat chest/abdominal area when lifting a package at [employer]. Pt notes the pain can radiate to her R back and her R upper shoulder hurts as well. At the time of the injury, the pt states the pain was intense and she felt like she was going to throw up. Pt indicates there was an area of swelling which ran along her ant/lat chest from under her R breast to her last rib. Following the injury, she worked on [employer] duty (small sort) for 1 week. Currently she is out of work on [employer]. She can return to work on light duty when she can tolerate lifting 15 lbs for 3.5 hours. In addition to pain, pt reports muscle spasms in the area. Pt has 6 children.  Pt is Rt handed  PERTINENT HISTORY:  NA  PAIN:  Are you having pain? Yes: NPRS scale: R upper trap 3/10. R abdominals 3/10 Pain location: R ant/lat chest/abdominal area, radiates to R back, R upper shoulder Pain description: stab in scap, upper trap aches, abdomen burns Aggravating factors: HEP, driving, sweeping, pushing large grocery cart  Relieving factors: Cold pack, hot bath soaks Pain range on eval: 3-8/10; pain range 12/23/22: 3-6/10  PRECAUTIONS: None  WEIGHT BEARING RESTRICTIONS: No  FALLS:  Has patient  fallen in last 6 months? 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 Pt's need to be able to lift 75# to return to full duty  PLOF: Independent  PATIENT GOALS: Pain relief and to return to work  OBJECTIVE:   DIAGNOSTIC FINDINGS:  NA  PATIENT SURVEYS:  FOTO: Perceived function   31%, predicted   57%  FOTO 38% 10/09/22 FOTO 42% 11/14/22 FOTO 43% 12/23/22  SCREENING FOR RED FLAGS: Bowel or bladder incontinence:   COGNITION: Overall cognitive status: Within functional limits for tasks assessed     SENSATION: WFL  MUSCLE LENGTH: Hamstrings: Right NT deg; Left NT deg Maisie Fus test: Right NT deg; Left NT deg  POSTURE: rounded shoulders and forward head  PALPATION: TTP to the ant/lat R rib cage from the 5th to 12th ribs and of the R upper trap  UE MMT:  MMT Right 09/04/22 Left 09/04/22 Right  11/03/22 Rt 11/14/22  Shoulder flexion 4p 5 4 4+  Shoulder extension 4p 5    Shoulder abduction 4p 5 4 4+  Shoulder adduction 4p 5    Shoulder extension 4p 5    Shoulder internal rotation 4p 5 5   Shoulder external rotation 4p 5 4+ p in right side 4+ p rt side  Elbow flexion 5 5    Elbow extension 5 5    Wrist flexion      Wrist extension      Wrist ulnar deviation      Wrist radial deviation      Wrist pronation      Wrist supination       (Blank rows = not tested) P=denotes concordant pain  UE ROM:  AROM Right 09/04/22 Left 09/04/22 RT 10/07/22 RT 11/03/22 RT 11/14/22 RT 12/05/22 RT 12/23/22 RT 01/13/23  Shoulder flexion 100 150 130 128 140d p 132 A 145d  145  Shoulder extension          Shoulder abduction 100 150 120 120 128d p 138 A 140d 145  Shoulder adduction          Shoulder extension          Shoulder internal rotation          Shoulder external rotation          Middle trapezius           Lower trapezius          Elbow flexion          Elbow extension          Wrist flexion          Wrist extension          Wrist ulnar deviation          Wrist radial deviation          Wrist pronation          Wrist supination          Grip strength           (Blank rows = not tested)    P=denotes concordant pain  TODAY'S TREATMENT: [facility] Adult PT Treatment:                                                DATE: 02/06/23 Therapeutic Exercise: UBE 2 mins fwd and bwd Single arm lifts waist to shoulder height, then overhead height 2x10 10# Bilat arm lifts waist to shoulder height, then overhead height 2x10 15# Hinged hip lifting 2x5 45# Dead lifts 2x5 45# Trunk rotation x10 BluTB  Alt lunge serratus punches 2x10 BluTB  Therapeutic Activity: FOTO reassessed and reviewed Modalities: *** Self Care: ***   [facility] Adult PT Treatment:                                                DATE: 01/20/23 Therapeutic Exercise: UBE 2 mins fwd and bwd Cervical SB 2x 15 sec c gentle pt over pressure Cervical rot 2x 15 sec c gentle pt over pressure R UE lifts to shoulder height 2x10 10# Hinged hip lifting 2x10 30#  Manual Therapy: STM to the R upper trap and mid back paraspinals Skilled palpation to identify TrPs and taut muscle bands Trigger Point Dry Needling Treatment: Pre-treatment instruction: Patient instructed on dry needling rationale, procedures, and possible side effects including pain during treatment (achy,cramping feeling), bruising, drop of blood, lightheadedness, nausea, sweating. Patient Consent Given: Yes Education handout provided: Yes Muscles treated: R upper trap, R paraspinals and multifidi T9-T10 Needle size and number: .30x76mm x 3 Electrical stimulation performed: No Parameters: N/A Treatment response/outcome: Twitch response elicited and Palpable decrease in muscle tension Post-treatment instructions: Patient instructed to expect possible mild to moderate muscle  soreness later today and/or tomorrow. Patient instructed in methods to reduce muscle soreness and to continue prescribed HEP. If patient was dry needled over the lung field, patient was instructed on signs and symptoms of pneumothorax and, however unlikely, to see immediate medical attention should they occur. Patient was also educated on signs and symptoms of infection  and to seek medical attention should they occur. Patient verbalized understanding of these instructions and education.  [facility] Adult PT Treatment:                                                DATE: 01/13/23 Therapeutic Exercise: UBE Level 2,  2 min  each way  Standing row Green band x 15 - increased pain Supine serratus dowel lifts x 15  Supine alternating diagonals  red  x 15  Supine horiz abdct Red x 15  Prone retraction with extension x 10 Prone W back x 10 Reviewed and condensed HEP  [facility] Adult PT Treatment:                                                DATE: 01/06/23 Therapeutic Exercise: UBE 2 mins fwd and bwd Supine chin tuck x10 3' Supine DNF lift offs x10 5" Cervical SB 2x 15 sec c gentle pt over pressure Cervical rot 2x 15 sec c gentle pt over pressure Self Care: Pt Ed for the use of theracare and tennis c wall massage to addressTrPs and taut muscle bands Manual Therapy: STM to the R upper trap and mid back paraspinals Trigger Point Dry Needling Treatment: Pre-treatment instruction: Patient instructed on dry needling rationale, procedures, and possible side effects including pain during treatment (achy,cramping feeling), bruising, drop of blood, lightheadedness, nausea, sweating. Patient Consent Given: Yes Education handout provided: Yes Muscles treated: R upper  trap  Needle size and number: .30x61mm x 1 Electrical stimulation performed: No Parameters: N/A Treatment response/outcome: Twitch response elicited and Palpable decrease in muscle tension Post-treatment instructions: Patient instructed to expect  possible mild to moderate muscle soreness later today and/or tomorrow. Patient instructed in methods to reduce muscle soreness and to continue prescribed HEP. If patient was dry needled over the lung field, patient was instructed on signs and symptoms of pneumothorax and, however unlikely, to see immediate medical attention should they occur. Patient was also educated on signs and symptoms of infection and to seek medical attention should they occur. Patient verbalized understanding of these instructions and education.   [facility] Adult PT Treatment:                                                DATE: 12/23/22 Therapeutic Exercise: UT and levator stretches adding overpressure  Wall slides for right shoulder flexion and abduction x 10 each S/L shoulder abduction and flexion x 10 each Open book x 10 each Therapeutic Activity: AROM/ FOTO outcome measure Update of goal status  [facility] Adult PT Treatment:                                                DATE: 12/16/22 Therapeutic Exercise: Serratus push up at wall x10 L stretch at counter, forward and laterally Standing alt UE lifts 10 x 2, 7#, waist to head height Marching on airex c UEs 90d c 3# wts Doorway ITB/QL stretches  Banded Lateral steps c BluTB 20' x4 High knee steps c opp arm lifts and heel lifts 20' x 2 Forward step ups 8" c opp arm lifts x10  PATIENT EDUCATION:  Education details: Eval findings, POC, HEP, self care  Person educated: Patient Education method: Explanation, Demonstration, Tactile cues, Verbal cues, and Handouts Education comprehension: verbalized understanding, returned demonstration, verbal cues required, tactile cues required, and needs further education  HOME EXERCISE PROGRAM: Access Code: [email or web address] URL: [email or web address] Date: 01/06/2023 Prepared by: Joellyn Rued  Exercises - Standing 'L' Stretch at Counter  - 2 x daily - 7 x weekly - 1 sets - 10 reps - 5-20 hold - Shoulder Flexion Wall Slide with  Towel  - 2 x daily - 7 x weekly - 1 sets - 10 reps - 5=10 hold - Supine Posterior Pelvic Tilt  - 2 x daily - 7 x weekly - 1 sets - 10 reps - 3 hold - Supine Shoulder Flexion Extension AAROM with Dowel  - 2 x daily - 7 x weekly - 1 sets - 10 reps - Standing Anti-Rotation Press with Anchored Resistance  - 2 x daily - 7 x weekly - 1 sets - 10 reps - 3 hold - Seated Upper Trapezius Stretch  - 1 x daily - 7 x weekly - 1 sets - 3 reps - 15-30 hold - Gentle Levator Scapulae Stretch  - 1 x daily - 7 x weekly - 1 sets - 3 reps - 15-30 hold - Hooklying Rib Cage Breathing  - 2 x daily - 7 x weekly - 2 sets - 5 reps - 5 hold - Hooklying Isometric Hip Flexion  - 1 x daily - 7 x weekly - 2 sets - 5 reps - 5 hold - Hooklying Isometric Hip Flexion with Opposite Arm  - 1 x daily - 7 x weekly - 2 sets - 5 reps - 5 hold - Standing March with Alternating [facility]  - 1 x daily - 7 x weekly - 3 sets - 10 reps - Supine Cervical Retraction with Towel  - 1 x daily - 7 x weekly - 1 sets - 10 reps - 3 hold - Supine Deep Neck Flexor Training - Repetitions  - 1 x daily - 7 x weekly - 1 sets - 10 reps - 5 hold - Seated Cervical Sidebending Stretch  - 1 x daily - 7 x weekly - 2 sets - 10 reps - 15 hold - Standing Cervical Rotation AROM with Overpressure (Mirrored)  - 1 x daily - 7 x weekly - 2 sets - 10 reps - 15 hold  ASSESSMENT:  CLINICAL IMPRESSION: PT was completed for STM f/b TPDN to the R upper trap and mid back for pain modulation. Muscle twitch responses were elicited. Pt reported muscle tension was decreased following the TPDN. Pt then completed cervical mobility and simulated work lifting activities. Pt was able to tolerate an minimal increase with lifting demand. Pt tolerated PT today without adverse effects. Pt will continue to benefit from skilled PT 1w6 to address impairments for improved functional mobility with less pain for return to work.    OBJECTIVE IMPAIRMENTS: decreased activity tolerance, decreased  ROM, decreased strength, increased muscle spasms, and pain.   ACTIVITY LIMITATIONS: carrying, lifting, dressing, reach over head, and caring for others  PARTICIPATION LIMITATIONS: meal prep, cleaning, laundry, driving, occupation, and yard work  PERSONAL FACTORS: Time since onset of injury/illness/exacerbation are also affecting patient's functional outcome.   REHAB POTENTIAL: Excellent  CLINICAL DECISION MAKING: Stable/uncomplicated  EVALUATION COMPLEXITY: Low   GOALS:  SHORT TERM GOALS: Target date: 09/26/22  Pt will be Ind in an initial HEP  Baseline: started Goal status: MET 2.  Pt will voice understanding of measures to assist in pain reduction  Baseline: started 09/25/22: use of cold pack, moist heat, knee bolster for sleep position Goal status: MET   LONG TERM GOALS: Target date: 03/13/23  Pt will be Ind in a final HEP to maintain achieved LOF  Baseline: started Goal status: ONGOING  2.  Increase R shoulder flexion and abd to 145d for appropriate upper body function Baseline: 100d for both 10/07/22: Flex 130; abd 120 11/14/22: Flex 140; abd 128 12/05/22: abd 138 12/23/22: 145 flexion 01/13/23: 145 flexion and abduction Goal status: MET   3.  Pt will report a decrease in pain to 0-3/10 with daily and work related activities form improved QOL and to return back to work Baseline: 3-8/10 See pain scale above 12/23/22: 3-4/10 rest, up to 5-6/10 with activity 01/13/23: 3 or less for abdomen Goal status: MET for abdominal pain, Not met for shoulder   4.  Increase R shoulder strength to 4+/5 or greater for improved R UE function for return to work  Baseline: See flow sheets 11/03/22: see chart, partially me 11/14/22: see flow sheet Goal status: MET  5.  Pt will be able to handle 15#, lifting from waist to shoulder height 2 set of 10  Baseline: not tested 10/14/22: 3# and 5# waist to shoulder height x10 each 11/03/22: can lift 15# dumb bell using both UE from counter to  should height shelf 1 x 10 12/05/22: can lift 15# dumb bell using both UE from counter to should height shelf 2 x 10 01/20/23: 10# 2x10 R UE 02/06/23: Goal status: Improved-not yet met for 1 hand. MET for 2 hands  6.  Pt will be able to handle 75#, lifting from floor to waist height  2 sets of 10 Baseline: not tested 10/17/22: 10# hinged hip lifting 11/03/22: 25 # hinged hip lifting  12/23/22: 25# hinged hip lifting  01/20/23: 30# hinged hip lifting Goal status: Ongoing-Not yet met  7.  Pt's FOTO score will improved to the predicted value of 57% as indication of improved function  Baseline: 31% 10/09/22: 38%  11/14/22: 42% 12/23/22: 43% Goal status: Ongoing- Not yet met  PLAN:  PT FREQUENCY: 1x per week  PT DURATION: 6 weeks  PLANNED INTERVENTIONS: Therapeutic exercises, Therapeutic activity, Patient/Family education, Self Care, Joint mobilization, Dry Needling, Electrical stimulation, Cryotherapy, Moist heat, Taping, Ultrasound, Ionotophoresis 4mg /ml Dexamethasone, Manual therapy, and Re-evaluation.  PLAN FOR NEXT SESSION: TPDN PRN, scapular stabilization  Check all possible CPT codes: 78295 - PT Re-evaluation, 97110- Therapeutic Exercise, 97140 - Manual Therapy, 97530 - Therapeutic Activities, [phone] - Self Care, [phone] - Electrical stimulation (Manual), [id number] - Ultrasound, and [id number] - Aquatic therapy                         Check all conditions that are expected to impact treatment: {Conditions expected to impact treatment:Morbid obesity and Musculoskeletal disorders     Joellyn Rued MS, PT 02/07/23 4:21 PM

## 2023-02-06 ENCOUNTER — Ambulatory Visit: Payer: BC Managed Care – PPO

## 2023-02-06 DIAGNOSIS — R252 Cramp and spasm: Secondary | ICD-10-CM

## 2023-02-06 DIAGNOSIS — M546 Pain in thoracic spine: Secondary | ICD-10-CM

## 2023-02-09 NOTE — Therapy (Signed)
OUTPATIENT PHYSICAL THERAPY THORACOLUMBAR TREATMENT NOTE/Re-Cert   Patient Name: Kristen [name] MRN: [id number] DOB:[birth year], 42 y.o., female Today's Date: 02/10/2023  END OF SESSION:  PT End of Session - 02/10/23 1436     Visit Number 23    Number of Visits 30    Date for PT Re-Evaluation 03/13/23    Authorization Type [employer] COMM PPO/ [employer]    PT Start Time 1420    PT Stop Time 1500    PT Time Calculation (min) 40 min    Activity Tolerance Patient tolerated treatment well    Behavior During Therapy WFL for tasks assessed/performed                   Past Medical History:  Diagnosis Date   Alpha thalassemia (HCC)    Asthma    uses inhaler daily/as needed   Complication of anesthesia    reaction to medication in epidural -morphine   Endometriosis    Preterm labor with preterm delivery    Seasonal allergies    Tubular adenoma of colon    Past Surgical History:  Procedure Laterality Date   CESAREAN SECTION     CESAREAN SECTION N/A 10/29/2015   Procedure: CESAREAN SECTION;  Surgeon: Adam Phenix, MD;  Location: [facility] BIRTHING SUITES;  Service: Obstetrics;  Laterality: N/A;   DILATION AND CURETTAGE OF UTERUS     WISDOM TOOTH EXTRACTION  2014   Patient Active Problem List   Diagnosis Date Noted   Headache 12/02/2022   Nausea 12/02/2022   Sprain of abdominal wall 09/23/2022   Vitamin D deficiency 07/10/2022   Microcytosis 07/10/2022   Alpha thalassemia (HCC) 07/10/2022   Obesity (BMI 30.0-34.9) 07/09/2022   Hypnagogic hallucinations 10/22/2016   Gestational diabetes mellitus (GDM) affecting fifth pregnancy 10/22/2016   Unwanted fertility 09/17/2016   Bowel trouble 02/07/2016   Constipation 02/07/2016    PCP: Philip Aspen, Limmie Patricia, MD   REFERRING PROVIDER: Philip Aspen, Limmie Patricia, MD   REFERRING DIAG: [phone] (ICD-10-CM) - Strain of abdominal wall, initial encounter   Rationale for Evaluation and Treatment:  Rehabilitation  THERAPY DIAG:  Pain in thoracic spine  Cramp and spasm  ONSET DATE: June 14th, 2024  SUBJECTIVE:                                                                                                                                                                                           SUBJECTIVE STATEMENT: 02/10/23: pt reports her pain levels are the same, but she feels her R shoulder strength is improving, and her recovery period from exercising is decreasing.  EVAL: Pt reports  she strained her R ant/lat chest/abdominal area when lifting a package at [employer]. Pt notes the pain can radiate to her R back and her R upper shoulder hurts as well. At the time of the injury, the pt states the pain was intense and she felt like she was going to throw up. Pt indicates there was an area of swelling which ran along her ant/lat chest from under her R breast to her last rib. Following the injury, she worked on [employer] duty (small sort) for 1 week. Currently she is out of work on [employer]. She can return to work on light duty when she can tolerate lifting 15 lbs for 3.5 hours. In addition to pain, pt reports muscle spasms in the area. Pt has 6 children.  Pt is Rt handed  PERTINENT HISTORY:  NA  PAIN:  Are you having pain? Yes: NPRS scale: R upper trap 3/10. R abdominals 3/10 Pain location: R ant/lat chest/abdominal area, radiates to R back, R upper shoulder Pain description: stab in scap, upper trap aches, abdomen burns Aggravating factors: HEP, driving, sweeping, pushing large grocery cart  Relieving factors: Cold pack, hot bath soaks Pain range on eval: 3-8/10; pain range 12/23/22: 3-6/10  PRECAUTIONS: None  WEIGHT BEARING RESTRICTIONS: No  FALLS:  Has patient fallen in last 6 months? 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 Pt's need to be able to lift 75# to return to full duty  PLOF: Independent  PATIENT GOALS: Pain relief and to return to work  OBJECTIVE:   DIAGNOSTIC FINDINGS:  NA  PATIENT SURVEYS:  FOTO: Perceived function   31%, predicted   57%  FOTO 38% 10/09/22 FOTO 42% 11/14/22 FOTO 43% 12/23/22 FOTO 40% 02/06/23  SCREENING FOR RED FLAGS: Bowel or bladder incontinence:   COGNITION: Overall cognitive status: Within functional limits for tasks assessed     SENSATION: WFL  MUSCLE LENGTH: Hamstrings: Right NT deg; Left NT deg Maisie Fus test: Right NT deg; Left NT deg  POSTURE: rounded shoulders and forward head  PALPATION: TTP to the ant/lat R rib cage from the 5th to 12th ribs and of the R upper trap  UE MMT:  MMT Right 09/04/22 Left 09/04/22 Right  11/03/22 Rt 11/14/22  Shoulder flexion 4p 5 4 4+  Shoulder extension 4p 5    Shoulder abduction 4p 5 4 4+  Shoulder adduction 4p 5    Shoulder extension 4p 5    Shoulder internal rotation 4p 5 5   Shoulder external rotation 4p 5 4+ p in right side 4+ p rt side  Elbow flexion 5 5    Elbow extension 5 5    Wrist flexion      Wrist extension      Wrist ulnar deviation      Wrist radial deviation      Wrist pronation      Wrist supination       (Blank rows = not tested) P=denotes concordant pain  UE ROM:  AROM Right 09/04/22 Left 09/04/22 RT 10/07/22 RT 11/03/22 RT 11/14/22 RT 12/05/22 RT 12/23/22 RT 01/13/23  Shoulder flexion 100 150 130 128 140d p 132 A 145d  145  Shoulder extension          Shoulder abduction 100 150 120 120 128d p 138 A 140d 145  Shoulder adduction  Shoulder extension          Shoulder internal rotation          Shoulder external rotation          Middle trapezius          Lower trapezius          Elbow flexion          Elbow extension          Wrist flexion          Wrist extension          Wrist ulnar deviation          Wrist radial deviation          Wrist  pronation          Wrist supination          Grip strength           (Blank rows = not tested)    P=denotes concordant pain  TODAY'S TREATMENT: [facility] Adult PT Treatment:                                                DATE: 02/10/23 Therapeutic Exercise: UBE 2 mins fwd and bwd Single arm lifts waist to shoulder height, then overhead height 2x10 10# Bilat arm lifts waist 2x10 20# for shoulder height then 15# for overhead Hinged hip lifting 2x10 45# Dead lifts 2x5 45# Omega shoulder row 2x12 45# Omega lat pull 2x12 35# Omega chest press 2x12 45#  [facility] Adult PT Treatment:                                                DATE: 02/06/23 Therapeutic Exercise: UBE 2 mins fwd and bwd Single arm lifts waist to shoulder height, then overhead height 2x10 10# Bilat arm lifts waist to shoulder height, then overhead height 2x10 15# Hinged hip lifting 2x5 45# Dead lifts 2x5 45# Trunk rotation x10 BluTB  Alt lunge serratus punches 2x10 BluTB Therapeutic Activity: FOTO reassessed and reviewed  [facility] Adult PT Treatment:                                                DATE: 01/20/23 Therapeutic Exercise: UBE 2 mins fwd and bwd Cervical SB 2x 15 sec c gentle pt over pressure Cervical rot 2x 15 sec c gentle pt over pressure R UE lifts to shoulder height 2x10 10# Hinged hip lifting 2x10 30#  Manual Therapy: STM to the R upper trap and mid back paraspinals Skilled palpation to identify TrPs and taut muscle bands Trigger Point Dry Needling Treatment: Pre-treatment instruction: Patient instructed on dry needling rationale, procedures, and possible side effects including pain during treatment (achy,cramping feeling), bruising, drop of blood, lightheadedness, nausea, sweating. Patient Consent Given: Yes Education handout provided: Yes Muscles treated: R upper trap, R paraspinals and multifidi T9-T10 Needle size and number: .30x38mm x 3 Electrical stimulation performed: No Parameters: N/A Treatment  response/outcome: Twitch response elicited and Palpable decrease in muscle tension Post-treatment instructions: Patient instructed to expect possible mild to moderate muscle soreness later  today and/or tomorrow. Patient instructed in methods to reduce muscle soreness and to continue prescribed HEP. If patient was dry needled over the lung field, patient was instructed on signs and symptoms of pneumothorax and, however unlikely, to see immediate medical attention should they occur. Patient was also educated on signs and symptoms of infection and to seek medical attention should they occur. Patient verbalized understanding of these instructions and education.  [facility] Adult PT Treatment:                                                DATE: 01/13/23 Therapeutic Exercise: UBE Level 2,  2 min  each way  Standing row Green band x 15 - increased pain Supine serratus dowel lifts x 15  Supine alternating diagonals  red  x 15  Supine horiz abdct Red x 15  Prone retraction with extension x 10 Prone W back x 10 Reviewed and condensed HEP  [facility] Adult PT Treatment:                                                DATE: 01/06/23 Therapeutic Exercise: UBE 2 mins fwd and bwd Supine chin tuck x10 3' Supine DNF lift offs x10 5" Cervical SB 2x 15 sec c gentle pt over pressure Cervical rot 2x 15 sec c gentle pt over pressure Self Care: Pt Ed for the use of theracare and tennis c wall massage to addressTrPs and taut muscle bands Manual Therapy: STM to the R upper trap and mid back paraspinals Trigger Point Dry Needling Treatment: Pre-treatment instruction: Patient instructed on dry needling rationale, procedures, and possible side effects including pain during treatment (achy,cramping feeling), bruising, drop of blood, lightheadedness, nausea, sweating. Patient Consent Given: Yes Education handout provided: Yes Muscles treated: R upper  trap  Needle size and number: .30x44mm x 1 Electrical stimulation performed:  No Parameters: N/A Treatment response/outcome: Twitch response elicited and Palpable decrease in muscle tension Post-treatment instructions: Patient instructed to expect possible mild to moderate muscle soreness later today and/or tomorrow. Patient instructed in methods to reduce muscle soreness and to continue prescribed HEP. If patient was dry needled over the lung field, patient was instructed on signs and symptoms of pneumothorax and, however unlikely, to see immediate medical attention should they occur. Patient was also educated on signs and symptoms of infection and to seek medical attention should they occur. Patient verbalized understanding of these instructions and education.   PATIENT EDUCATION:  Education details: Eval findings, POC, HEP, self care  Person educated: Patient Education method: Explanation, Demonstration, Tactile cues, Verbal cues, and Handouts Education comprehension: verbalized understanding, returned demonstration, verbal cues required, tactile cues required, and needs further education  HOME EXERCISE PROGRAM: Access Code: [email or web address] URL: [email or web address] Date: 01/06/2023 Prepared by: Joellyn Rued  Exercises - Standing 'L' Stretch at Counter  - 2 x daily - 7 x weekly - 1 sets - 10 reps - 5-20 hold - Shoulder Flexion Wall Slide with Towel  - 2 x daily - 7 x weekly - 1 sets - 10 reps - 5=10 hold - Supine Posterior Pelvic Tilt  - 2 x daily - 7 x weekly - 1 sets - 10 reps - 3 hold - Supine Shoulder Flexion  Extension AAROM with Dowel  - 2 x daily - 7 x weekly - 1 sets - 10 reps - Standing Anti-Rotation Press with Anchored Resistance  - 2 x daily - 7 x weekly - 1 sets - 10 reps - 3 hold - Seated Upper Trapezius Stretch  - 1 x daily - 7 x weekly - 1 sets - 3 reps - 15-30 hold - Gentle Levator Scapulae Stretch  - 1 x daily - 7 x weekly - 1 sets - 3 reps - 15-30 hold - Hooklying Rib Cage Breathing  - 2 x daily - 7 x weekly - 2 sets - 5 reps - 5 hold -  Hooklying Isometric Hip Flexion  - 1 x daily - 7 x weekly - 2 sets - 5 reps - 5 hold - Hooklying Isometric Hip Flexion with Opposite Arm  - 1 x daily - 7 x weekly - 2 sets - 5 reps - 5 hold - Standing March with Alternating [facility]  - 1 x daily - 7 x weekly - 3 sets - 10 reps - Supine Cervical Retraction with Towel  - 1 x daily - 7 x weekly - 1 sets - 10 reps - 3 hold - Supine Deep Neck Flexor Training - Repetitions  - 1 x daily - 7 x weekly - 1 sets - 10 reps - 5 hold - Seated Cervical Sidebending Stretch  - 1 x daily - 7 x weekly - 2 sets - 10 reps - 15 hold - Standing Cervical Rotation AROM with Overpressure (Mirrored)  - 1 x daily - 7 x weekly - 2 sets - 10 reps - 15 hold  ASSESSMENT:  CLINICAL IMPRESSION: PT was completed for upper body/trunk strengthening with work related lifting tasks and also per isotonic weight machines. Pt tolerated c min increase in R shoulder pain to 4/10. Pt reports the recovery time frame following exercising is decreasing. 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  OBJECTIVE IMPAIRMENTS: decreased activity tolerance, decreased ROM, decreased strength, increased muscle spasms, and pain.   ACTIVITY LIMITATIONS: carrying, lifting, dressing, reach over head, and caring for others  PARTICIPATION LIMITATIONS: meal prep, cleaning, laundry, driving, occupation, and yard work  PERSONAL FACTORS: Time since onset of injury/illness/exacerbation are also affecting patient's functional outcome.   REHAB POTENTIAL: Excellent  CLINICAL DECISION MAKING: Stable/uncomplicated  EVALUATION COMPLEXITY: Low   GOALS:  SHORT TERM GOALS: Target date: 09/26/22  Pt will be Ind in an initial HEP  Baseline: started Goal status: MET 2.  Pt will voice understanding of measures to assist in pain reduction  Baseline: started 09/25/22:  use of cold pack, moist heat, knee bolster for sleep position Goal status: MET   LONG TERM GOALS: Target date: 03/13/23  Pt will be Ind in a final HEP to maintain achieved LOF  Baseline: started Goal status: ONGOING  2.  Increase R shoulder flexion and abd to 145d for appropriate upper body function Baseline: 100d for both 10/07/22: Flex 130; abd 120 11/14/22: Flex 140; abd 128 12/05/22: abd 138 12/23/22: 145 flexion 01/13/23: 145 flexion and abduction Goal status: MET   3.  Pt will report a decrease in pain to 0-3/10 with daily and work related activities form improved QOL and to return back to work Baseline: 3-8/10 See pain scale above 12/23/22: 3-4/10 rest, up to 5-6/10 with activity 01/13/23: 3  or less for abdomen Goal status: MET for abdominal pain, Not met for shoulder   4.  Increase R shoulder strength to 4+/5 or greater for improved R UE function for return to work  Baseline: See flow sheets 11/03/22: see chart, partially me 11/14/22: see flow sheet Goal status: MET  5.  Pt will be able to handle 15#, lifting from waist to shoulder height 2 set of 10  Baseline: not tested 10/14/22: 3# and 5# waist to shoulder height x10 each 11/03/22: can lift 15# dumb bell using both UE from counter to should height shelf 1 x 10 12/05/22: can lift 15# dumb bell using both UE from counter to should height shelf 2 x 10 01/20/23: 10# 2x10 R UE 02/06/23: 10# 2x10 R UE, 15# bilat UE Goal status: Improved-not yet met for 1 hand. MET for 2 hands  6.  Pt will be able to handle 75#, lifting from floor to waist height  2 sets of 10 Baseline: not tested 10/17/22: 10# hinged hip lifting 11/03/22: 25 # hinged hip lifting  12/23/22: 25# hinged hip lifting  01/20/23: 30# hinged hip lifting 02/06/23: 45# hinged hip lifting and dead lifts Goal status: Improving-Not yet met  7.  Pt's FOTO score will improved to the predicted value of 57% as indication of improved function  Baseline: 31% 10/09/22: 38%   11/14/22: 42% 12/23/22: 43% 02/06/23: 40% Goal status: Ongoing- Not yet met  PLAN:  PT FREQUENCY: 2x per week for 5 more weeks  PT DURATION: 6 weeks  PLANNED INTERVENTIONS: Therapeutic exercises, Therapeutic activity, Patient/Family education, Self Care, Joint mobilization, Dry Needling, Electrical stimulation, Cryotherapy, Moist heat, Taping, Ultrasound, Ionotophoresis 4mg /ml Dexamethasone, Manual therapy, and Re-evaluation.  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  Check all possible CPT codes: 91478 - PT Re-evaluation, 97110- Therapeutic Exercise, 97140 - Manual Therapy, 97530 - Therapeutic Activities, [phone] - Self Care, [phone] - Electrical stimulation (Manual), [id number] - Ultrasound, and [id number] - Aquatic therapy                         Check all conditions that are expected to impact treatment: {Conditions expected to impact treatment:Morbid obesity and Musculoskeletal disorders     Joellyn Rued MS, PT 02/10/23 3:37 PM

## 2023-02-10 ENCOUNTER — Ambulatory Visit: Payer: BC Managed Care – PPO

## 2023-02-10 DIAGNOSIS — M546 Pain in thoracic spine: Secondary | ICD-10-CM | POA: Diagnosis not present

## 2023-02-10 DIAGNOSIS — R252 Cramp and spasm: Secondary | ICD-10-CM | POA: Diagnosis not present

## 2023-02-12 ENCOUNTER — Ambulatory Visit: Payer: BC Managed Care – PPO | Admitting: Physical Therapy

## 2023-02-12 ENCOUNTER — Encounter: Payer: Self-pay | Admitting: Physical Therapy

## 2023-02-16 NOTE — Therapy (Deleted)
OUTPATIENT PHYSICAL THERAPY THORACOLUMBAR TREATMENT NOTE/Re-Cert   Patient Name: Kristen [name] MRN: [id number] DOB:12/13/1980, 42 y.o., female Today's Date: 02/16/2023  END OF SESSION:          Past Medical History:  Diagnosis Date   Alpha thalassemia (HCC)    Asthma    uses inhaler daily/as needed   Complication of anesthesia    reaction to medication in epidural -morphine   Endometriosis    Preterm labor with preterm delivery    Seasonal allergies    Tubular adenoma of colon    Past Surgical History:  Procedure Laterality Date   CESAREAN SECTION     CESAREAN SECTION N/A 10/29/2015   Procedure: CESAREAN SECTION;  Surgeon: Adam Phenix, MD;  Location: [facility] BIRTHING SUIT[facility];  Service: Obstetrics;  Laterality: N/A;   DILATION AND CURETTAGE OF UTERUS     WISDOM TOOTH EXTRACTION  2014   Patient Active Problem List   Diagnosis Date Noted   Headache 12/02/2022   Nausea 12/02/2022   Sprain of abdominal wall 09/23/2022   Vitamin D deficiency 07/10/2022   Microcytosis 07/10/2022   Alpha thalassemia (HCC) 07/10/2022   Obesity (BMI 30.0-34.9) 07/09/2022   Hypnagogic hallucinations 10/22/2016   Gestational diabetes mellitus (GDM) affecting fifth pregnancy 10/22/2016   Unwanted fertility 09/17/2016   Bowel trouble 02/07/2016   Constipation 02/07/2016    PCP: Philip Aspen, Limmie Patricia, MD   REFERRING PROVIDER: Philip Aspen, Limmie Patricia, MD   REFERRING DIAG: [phone] (ICD-10-CM) - Strain of abdominal wall, initial encounter   Rationale for Evaluation and Treatment: Rehabilitation  THERAPY DIAG:  No diagnosis found.  ONSET DATE: June 14th, 2024  SUBJECTIVE:                                                                                                                                                                                           SUBJECTIVE STATEMENT: 02/10/23: pt reports her pain levels are the same, but she feels her R shoulder strength is  improving, and her recovery period from exercising is decreasing.  EVAL: Pt reports she strained her R ant/lat chest/abdominal area when lifting a package at [employer]. Pt notes the pain can radiate to her R back and her R upper shoulder hurts as well. At the time of the injury, the pt states the pain was intense and she felt like she was going to throw up. Pt indicates there was an area of swelling which ran along her ant/lat chest from under her R breast to her last rib. Following the injury, she worked on [employer] duty (small sort) for 1 week. Currently she is out of work on  worker's comp. She can return to work on light duty when she can tolerate lifting 15 lbs for 3.5 hours. In addition to pain, pt reports muscle spasms in the area. Pt has 6 children.  Pt is Rt handed  PERTINENT HISTORY:  NA  PAIN:  Are you having pain? Yes: NPRS scale: R upper trap 3/10. R abdominals 3/10 Pain location: R ant/lat chest/abdominal area, radiates to R back, R upper shoulder Pain description: stab in scap, upper trap aches, abdomen burns Aggravating factors: HEP, driving, sweeping, pushing large grocery cart  Relieving factors: Cold pack, hot bath soaks Pain range on eval: 3-8/10; pain range 12/23/22: 3-6/10  PRECAUTIONS: None  WEIGHT BEARING RESTRICTIONS: No  FALLS:  Has patient fallen in last 6 months? 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 Pt's need to be able to lift 75# to return to full duty  PLOF: Independent  PATIENT GOALS: Pain relief and to return to work  OBJECTIVE:   DIAGNOSTIC FINDINGS:  NA  PATIENT SURVEYS:  FOTO: Perceived function   31%, predicted   57%  FOTO 38% 10/09/22 FOTO 42% 11/14/22 FOTO 43% 12/23/22 FOTO 40% 02/06/23  SCREENING FOR RED FLAGS: Bowel or bladder incontinence:    COGNITION: Overall cognitive status: Within functional limits for tasks assessed     SENSATION: WFL  MUSCLE LENGTH: Hamstrings: Right NT deg; Left NT deg Maisie Fus test: Right NT deg; Left NT deg  POSTURE: rounded shoulders and forward head  PALPATION: TTP to the ant/lat R rib cage from the 5th to 12th ribs and of the R upper trap  UE MMT:  MMT Right 09/04/22 Left 09/04/22 Right  11/03/22 Rt 11/14/22  Shoulder flexion 4p 5 4 4+  Shoulder extension 4p 5    Shoulder abduction 4p 5 4 4+  Shoulder adduction 4p 5    Shoulder extension 4p 5    Shoulder internal rotation 4p 5 5   Shoulder external rotation 4p 5 4+ p in right side 4+ p rt side  Elbow flexion 5 5    Elbow extension 5 5    Wrist flexion      Wrist extension      Wrist ulnar deviation      Wrist radial deviation      Wrist pronation      Wrist supination       (Blank rows = not tested) P=denotes concordant pain  UE ROM:  AROM Right 09/04/22 Left 09/04/22 RT 10/07/22 RT 11/03/22 RT 11/14/22 RT 12/05/22 RT 12/23/22 RT 01/13/23  Shoulder flexion 100 150 130 128 140d p 132 A 145d  145  Shoulder extension          Shoulder abduction 100 150 120 120 128d p 138 A 140d 145  Shoulder adduction          Shoulder extension          Shoulder internal rotation          Shoulder external rotation          Middle trapezius          Lower trapezius          Elbow flexion          Elbow extension          Wrist flexion          Wrist  extension          Wrist ulnar deviation          Wrist radial deviation          Wrist pronation          Wrist supination          Grip strength           (Blank rows = not tested)    P=denotes concordant pain  TODAY'S TREATMENT:   [facility] Adult PT Treatment:                                                DATE: 02/17/23 Therapeutic Exercise: *** Manual Therapy: *** Neuromuscular re-ed: *** Therapeutic Activity: *** Modalities: *** Self Care: ***  [facility] Adult PT Treatment:                                                 DATE: 02/10/23 Therapeutic Exercise: UBE 2 mins fwd and bwd Single arm lifts waist to shoulder height, then overhead height 2x10 10# Bilat arm lifts waist 2x10 20# for shoulder height then 15# for overhead Hinged hip lifting 2x10 45# Dead lifts 2x5 45# Omega shoulder row 2x12 45# Omega lat pull 2x12 35# Omega chest press 2x12 45#  [facility] Adult PT Treatment:                                                DATE: 02/06/23 Therapeutic Exercise: UBE 2 mins fwd and bwd Single arm lifts waist to shoulder height, then overhead height 2x10 10# Bilat arm lifts waist to shoulder height, then overhead height 2x10 15# Hinged hip lifting 2x5 45# Dead lifts 2x5 45# Trunk rotation x10 BluTB  Alt lunge serratus punches 2x10 BluTB Therapeutic Activity: FOTO reassessed and reviewed  [facility] Adult PT Treatment:                                                DATE: 01/20/23 Therapeutic Exercise: UBE 2 mins fwd and bwd Cervical SB 2x 15 sec c gentle pt over pressure Cervical rot 2x 15 sec c gentle pt over pressure R UE lifts to shoulder height 2x10 10# Hinged hip lifting 2x10 30#  Manual Therapy: STM to the R upper trap and mid back paraspinals Skilled palpation to identify TrPs and taut muscle bands Trigger Point Dry Needling Treatment: Pre-treatment instruction: Patient instructed on dry needling rationale, procedures, and possible side effects including pain during treatment (achy,cramping feeling), bruising, drop of blood, lightheadedness, nausea, sweating. Patient Consent Given: Yes Education handout provided: Yes Muscles treated: R upper trap, R paraspinals and multifidi T9-T10 Needle size and number: .30x12mm x 3 Electrical stimulation performed: No Parameters: N/A Treatment response/outcome: Twitch response elicited and Palpable decrease in muscle tension Post-treatment instructions: Patient instructed to expect possible mild to moderate muscle  soreness later today and/or tomorrow. Patient instructed in methods to reduce muscle soreness and to continue prescribed HEP. If patient  was dry needled over the lung field, patient was instructed on signs and symptoms of pneumothorax and, however unlikely, to see immediate medical attention should they occur. Patient was also educated on signs and symptoms of infection and to seek medical attention should they occur. Patient verbalized understanding of these instructions and education.  [facility] Adult PT Treatment:                                                DATE: 01/13/23 Therapeutic Exercise: UBE Level 2,  2 min  each way  Standing row Green band x 15 - increased pain Supine serratus dowel lifts x 15  Supine alternating diagonals  red  x 15  Supine horiz abdct Red x 15  Prone retraction with extension x 10 Prone W back x 10 Reviewed and condensed HEP  [facility] Adult PT Treatment:                                                DATE: 01/06/23 Therapeutic Exercise: UBE 2 mins fwd and bwd Supine chin tuck x10 3' Supine DNF lift offs x10 5" Cervical SB 2x 15 sec c gentle pt over pressure Cervical rot 2x 15 sec c gentle pt over pressure Self Care: Pt Ed for the use of theracare and tennis c wall massage to addressTrPs and taut muscle bands Manual Therapy: STM to the R upper trap and mid back paraspinals Trigger Point Dry Needling Treatment: Pre-treatment instruction: Patient instructed on dry needling rationale, procedures, and possible side effects including pain during treatment (achy,cramping feeling), bruising, drop of blood, lightheadedness, nausea, sweating. Patient Consent Given: Yes Education handout provided: Yes Muscles treated: R upper  trap  Needle size and number: .30x84mm x 1 Electrical stimulation performed: No Parameters: N/A Treatment response/outcome: Twitch response elicited and Palpable decrease in muscle tension Post-treatment instructions: Patient instructed to expect  possible mild to moderate muscle soreness later today and/or tomorrow. Patient instructed in methods to reduce muscle soreness and to continue prescribed HEP. If patient was dry needled over the lung field, patient was instructed on signs and symptoms of pneumothorax and, however unlikely, to see immediate medical attention should they occur. Patient was also educated on signs and symptoms of infection and to seek medical attention should they occur. Patient verbalized understanding of these instructions and education.   PATIENT EDUCATION:  Education details: Eval findings, POC, HEP, self care  Person educated: Patient Education method: Explanation, Demonstration, Tactile cues, Verbal cues, and Handouts Education comprehension: verbalized understanding, returned demonstration, verbal cues required, tactile cues required, and needs further education  HOME EXERCISE PROGRAM: Access Code: [email or web address] URL: [email or web address] Date: 01/06/2023 Prepared by: Joellyn Rued  Exercises - Standing 'L' Stretch at Counter  - 2 x daily - 7 x weekly - 1 sets - 10 reps - 5-20 hold - Shoulder Flexion Wall Slide with Towel  - 2 x daily - 7 x weekly - 1 sets - 10 reps - 5=10 hold - Supine Posterior Pelvic Tilt  - 2 x daily - 7 x weekly - 1 sets - 10 reps - 3 hold - Supine Shoulder Flexion Extension AAROM with Dowel  - 2 x daily - 7 x weekly - 1 sets - 10  reps - Standing Anti-Rotation Press with Anchored Resistance  - 2 x daily - 7 x weekly - 1 sets - 10 reps - 3 hold - Seated Upper Trapezius Stretch  - 1 x daily - 7 x weekly - 1 sets - 3 reps - 15-30 hold - Gentle Levator Scapulae Stretch  - 1 x daily - 7 x weekly - 1 sets - 3 reps - 15-30 hold - Hooklying Rib Cage Breathing  - 2 x daily - 7 x weekly - 2 sets - 5 reps - 5 hold - Hooklying Isometric Hip Flexion  - 1 x daily - 7 x weekly - 2 sets - 5 reps - 5 hold - Hooklying Isometric Hip Flexion with Opposite Arm  - 1 x daily - 7 x weekly - 2 sets - 5  reps - 5 hold - Standing March with Alternating Med [facility]  - 1 x daily - 7 x weekly - 3 sets - 10 reps - Supine Cervical Retraction with Towel  - 1 x daily - 7 x weekly - 1 sets - 10 reps - 3 hold - Supine Deep Neck Flexor Training - Repetitions  - 1 x daily - 7 x weekly - 1 sets - 10 reps - 5 hold - Seated Cervical Sidebending Stretch  - 1 x daily - 7 x weekly - 2 sets - 10 reps - 15 hold - Standing Cervical Rotation AROM with Overpressure (Mirrored)  - 1 x daily - 7 x weekly - 2 sets - 10 reps - 15 hold  ASSESSMENT:  CLINICAL IMPRESSION: PT was completed for upper body/trunk strengthening with work related lifting tasks and also per isotonic weight machines. Pt tolerated c min increase in R shoulder pain to 4/10. Pt reports the recovery time frame following exercising is decreasing. 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  OBJECTIVE IMPAIRMENTS: decreased activity tolerance, decreased ROM, decreased strength, increased muscle spasms, and pain.   ACTIVITY LIMITATIONS: carrying, lifting, dressing, reach over head, and caring for others  PARTICIPATION LIMITATIONS: meal prep, cleaning, laundry, driving, occupation, and yard work  PERSONAL FACTORS: Time since onset of injury/illness/exacerbation are also affecting patient's functional outcome.   REHAB POTENTIAL: Excellent  CLINICAL DECISION MAKING: Stable/uncomplicated  EVALUATION COMPLEXITY: Low   GOALS:  SHORT TERM GOALS: Target date: 09/26/22  Pt will be Ind in an initial HEP  Baseline: started Goal status: MET 2.  Pt will voice understanding of measures to assist in pain reduction  Baseline: started 09/25/22: use of cold pack, moist heat, knee bolster for sleep position Goal status: MET   LONG TERM GOALS: Target date: 03/13/23  Pt will be Ind in a final HEP to maintain achieved LOF   Baseline: started Goal status: ONGOING  2.  Increase R shoulder flexion and abd to 145d for appropriate upper body function Baseline: 100d for both 10/07/22: Flex 130; abd 120 11/14/22: Flex 140; abd 128 12/05/22: abd 138 12/23/22: 145 flexion 01/13/23: 145 flexion and abduction Goal status: MET   3.  Pt will report a decrease in pain to 0-3/10 with daily and work related activities form improved QOL and to return back to work Baseline: 3-8/10 See pain scale above 12/23/22: 3-4/10 rest, up to 5-6/10 with activity 01/13/23: 3 or less for abdomen Goal status: MET for abdominal pain, Not met for shoulder   4.  Increase R shoulder strength to 4+/5 or greater for improved R UE function for return to work  Baseline: See flow sheets 11/03/22: see chart, partially me 11/14/22: see flow sheet Goal status: MET  5.  Pt will be able to handle 15#, lifting from waist to shoulder height 2 set of 10  Baseline: not tested 10/14/22: 3# and 5# waist to shoulder height x10 each 11/03/22: can lift 15# dumb bell using both UE from counter to should height shelf 1 x 10 12/05/22: can lift 15# dumb bell using both UE from counter to should height shelf 2 x 10 01/20/23: 10# 2x10 R UE 02/06/23: 10# 2x10 R UE, 15# bilat UE Goal status: Improved-not yet met for 1 hand. MET for 2 hands  6.  Pt will be able to handle 75#, lifting from floor to waist height  2 sets of 10 Baseline: not tested 10/17/22: 10# hinged hip lifting 11/03/22: 25 # hinged hip lifting  12/23/22: 25# hinged hip lifting  01/20/23: 30# hinged hip lifting 02/06/23: 45# hinged hip lifting and dead lifts Goal status: Improving-Not yet met  7.  Pt's FOTO score will improved to the predicted value of 57% as indication of improved function  Baseline: 31% 10/09/22: 38%  11/14/22: 42% 12/23/22: 43% 02/06/23: 40% Goal status: Ongoing- Not yet met  PLAN:  PT FREQUENCY: 2x per week for 5 more weeks  PT DURATION: 6 weeks  PLANNED  INTERVENTIONS: Therapeutic exercises, Therapeutic activity, Patient/Family education, Self Care, Joint mobilization, Dry Needling, Electrical stimulation, Cryotherapy, Moist heat, Taping, Ultrasound, Ionotophoresis 4mg /ml Dexamethasone, Manual therapy, and Re-evaluation.  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  Check all possible CPT codes: 16109 - PT Re-evaluation, 97110- Therapeutic Exercise, 97140 - Manual Therapy, 97530 - Therapeutic Activities, [phone] - Self Care, [phone] - Electrical stimulation (Manual), [id number] - Ultrasound, and [id number] - Aquatic therapy                         Check all conditions that are expected to impact treatment: {Conditions expected to impact treatment:Morbid obesity and Musculoskeletal disorders     Joellyn Rued MS, PT 02/16/23 8:25 AM

## 2023-02-17 ENCOUNTER — Encounter: Payer: Self-pay | Admitting: Physical Therapy

## 2023-02-17 ENCOUNTER — Ambulatory Visit: Payer: BC Managed Care – PPO | Admitting: Physical Therapy

## 2023-02-17 DIAGNOSIS — M546 Pain in thoracic spine: Secondary | ICD-10-CM

## 2023-02-17 DIAGNOSIS — R252 Cramp and spasm: Secondary | ICD-10-CM

## 2023-02-17 NOTE — Therapy (Signed)
OUTPATIENT PHYSICAL THERAPY THORACOLUMBAR TREATMENT NOTE   Patient Name: Kristen [name] MRN: [id number] DOB:February 12, 1981, 42 y.o., female Today's Date: 02/17/2023  END OF SESSION:  PT End of Session - 02/17/23 1102     Visit Number 24    Number of Visits 30    Date for PT Re-Evaluation 03/13/23    Authorization Type [employer] COMM PPO/ [employer] [place] Medicaid    Authorization Time Period 02/10/23-03/10/22    Authorization - Visit Number 2    Authorization - Number of Visits 4    PT Start Time 1101    PT Stop Time 1144    PT Time Calculation (min) 43 min                   Past Medical History:  Diagnosis Date   Alpha thalassemia (HCC)    Asthma    uses inhaler daily/as needed   Complication of anesthesia    reaction to medication in epidural -morphine   Endometriosis    Preterm labor with preterm delivery    Seasonal allergies    Tubular adenoma of colon    Past Surgical History:  Procedure Laterality Date   CESAREAN SECTION     CESAREAN SECTION N/A 10/29/2015   Procedure: CESAREAN SECTION;  Surgeon: Adam Phenix, MD;  Location: [facility] BIRTHING SUITES;  Service: Obstetrics;  Laterality: N/A;   DILATION AND CURETTAGE OF UTERUS     WISDOM TOOTH EXTRACTION  2014   Patient Active Problem List   Diagnosis Date Noted   Headache 12/02/2022   Nausea 12/02/2022   Sprain of abdominal wall 09/23/2022   Vitamin D deficiency 07/10/2022   Microcytosis 07/10/2022   Alpha thalassemia (HCC) 07/10/2022   Obesity (BMI 30.0-34.9) 07/09/2022   Hypnagogic hallucinations 10/22/2016   Gestational diabetes mellitus (GDM) affecting fifth pregnancy 10/22/2016   Unwanted fertility 09/17/2016   Bowel trouble 02/07/2016   Constipation 02/07/2016    PCP: Philip Aspen, Limmie Patricia, MD   REFERRING PROVIDER: Philip Aspen, Limmie Patricia, MD   REFERRING DIAG: [phone] (ICD-10-CM) - Strain of abdominal wall, initial encounter   Rationale for Evaluation and Treatment:  Rehabilitation  THERAPY DIAG:  Pain in thoracic spine  Cramp and spasm  ONSET DATE: June 14th, 2024  SUBJECTIVE:                                                                                                                                                                                           SUBJECTIVE STATEMENT: 02/10/23: The back is good. Having soreness in top of shoulder when I exercise or after. I do feel it with cleaning but its not  intense and it goes away when I rest it. I have worked up to 15-20 minutes of continued activity.   EVAL: Pt reports she strained her R ant/lat chest/abdominal area when lifting a package at [employer]. Pt notes the pain can radiate to her R back and her R upper shoulder hurts as well. At the time of the injury, the pt states the pain was intense and she felt like she was going to throw up. Pt indicates there was an area of swelling which ran along her ant/lat chest from under her R breast to her last rib. Following the injury, she worked on [employer] duty (small sort) for 1 week. Currently she is out of work on [employer]. She can return to work on light duty when she can tolerate lifting 15 lbs for 3.5 hours. In addition to pain, pt reports muscle spasms in the area. Pt has 6 children.  Pt is Rt handed  PERTINENT HISTORY:  NA  PAIN:  Are you having pain? Yes: NPRS scale: R upper trap 2-3/10.  Pain location: R upper trap  Pain description:achy Aggravating factors: HEP, driving, sweeping, pushing large grocery cart  Relieving factors: Cold pack, hot bath soaks Pain range on eval: 3-8/10; pain range 12/23/22: 3-6/10  PRECAUTIONS: None  WEIGHT BEARING RESTRICTIONS: No  FALLS:  Has patient fallen in last 6 months? 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 Pt's need to  be able to lift 75# to return to full duty  PLOF: Independent  PATIENT GOALS: Pain relief and to return to work  OBJECTIVE:   DIAGNOSTIC FINDINGS:  NA  PATIENT SURVEYS:  FOTO: Perceived function   31%, predicted   57%  FOTO 38% 10/09/22 FOTO 42% 11/14/22 FOTO 43% 12/23/22 FOTO 40% 02/06/23  SCREENING FOR RED FLAGS: Bowel or bladder incontinence:   COGNITION: Overall cognitive status: Within functional limits for tasks assessed     SENSATION: WFL  MUSCLE LENGTH: Hamstrings: Right NT deg; Left NT deg Maisie Fus test: Right NT deg; Left NT deg  POSTURE: rounded shoulders and forward head  PALPATION: TTP to the ant/lat R rib cage from the 5th to 12th ribs and of the R upper trap  UE MMT:  MMT Right 09/04/22 Left 09/04/22 Right  11/03/22 Rt 11/14/22  Shoulder flexion 4p 5 4 4+  Shoulder extension 4p 5    Shoulder abduction 4p 5 4 4+  Shoulder adduction 4p 5    Shoulder extension 4p 5    Shoulder internal rotation 4p 5 5   Shoulder external rotation 4p 5 4+ p in right side 4+ p rt side  Elbow flexion 5 5    Elbow extension 5 5    Wrist flexion      Wrist extension      Wrist ulnar deviation      Wrist radial deviation      Wrist pronation      Wrist supination       (Blank rows = not tested) P=denotes concordant pain  UE ROM:  AROM Right 09/04/22 Left 09/04/22 RT 10/07/22 RT 11/03/22 RT 11/14/22 RT 12/05/22 RT 12/23/22 RT 01/13/23  Shoulder flexion 100 150 130 128 140d p 132 A 145d  145  Shoulder extension          Shoulder abduction 100 150 120 120 128d p 138 A 140d  145  Shoulder adduction          Shoulder extension          Shoulder internal rotation          Shoulder external rotation          Middle trapezius          Lower trapezius          Elbow flexion          Elbow extension          Wrist flexion          Wrist extension          Wrist ulnar deviation          Wrist radial deviation          Wrist pronation          Wrist supination           Grip strength           (Blank rows = not tested)    P=denotes concordant pain  TODAY'S TREATMENT: [facility] Adult PT Treatment:                                                DATE: 02/17/23 Therapeutic Exercise: UBE 2 mins fwd and bwd Hinged hip lifting 2x10 45# Dead lifts 2x5 45# Suitcase Carry 15# 100 Ft each arm Suitcase Carry 20# 100 ft each arm  Deadlift 45# 2 x 6  FM row 20# 2 x 10 Chops 13# x 6 each  Shoulder pull downs  17#  Single arm lifts waist to shoulder height, then overhead height 2x10 10#     [facility] Adult PT Treatment:                                                DATE: 02/10/23 Therapeutic Exercise: UBE 2 mins fwd and bwd Single arm lifts waist to shoulder height, then overhead height 2x10 10# Bilat arm lifts waist 2x10 20# for shoulder height then 15# for overhead Hinged hip lifting 2x10 45# Dead lifts 2x5 45# Omega shoulder row 2x12 45# Omega lat pull 2x12 35# Omega chest press 2x12 45#  [facility] Adult PT Treatment:                                                DATE: 02/06/23 Therapeutic Exercise: UBE 2 mins fwd and bwd Single arm lifts waist to shoulder height, then overhead height 2x10 10# Bilat arm lifts waist to shoulder height, then overhead height 2x10 15# Hinged hip lifting 2x5 45# Dead lifts 2x5 45# Trunk rotation x10 BluTB  Alt lunge serratus punches 2x10 BluTB Therapeutic Activity: FOTO reassessed and reviewed  [facility] Adult PT Treatment:                                                DATE: 01/20/23 Therapeutic Exercise: UBE 2 mins fwd and bwd Cervical SB 2x 15  sec c gentle pt over pressure Cervical rot 2x 15 sec c gentle pt over pressure R UE lifts to shoulder height 2x10 10# Hinged hip lifting 2x10 30#  Manual Therapy: STM to the R upper trap and mid back paraspinals Skilled palpation to identify TrPs and taut muscle bands Trigger Point Dry Needling Treatment: Pre-treatment instruction: Patient instructed on dry needling rationale,  procedures, and possible side effects including pain during treatment (achy,cramping feeling), bruising, drop of blood, lightheadedness, nausea, sweating. Patient Consent Given: Yes Education handout provided: Yes Muscles treated: R upper trap, R paraspinals and multifidi T9-T10 Needle size and number: .30x3mm x 3 Electrical stimulation performed: No Parameters: N/A Treatment response/outcome: Twitch response elicited and Palpable decrease in muscle tension Post-treatment instructions: Patient instructed to expect possible mild to moderate muscle soreness later today and/or tomorrow. Patient instructed in methods to reduce muscle soreness and to continue prescribed HEP. If patient was dry needled over the lung field, patient was instructed on signs and symptoms of pneumothorax and, however unlikely, to see immediate medical attention should they occur. Patient was also educated on signs and symptoms of infection and to seek medical attention should they occur. Patient verbalized understanding of these instructions and education.  [facility] Adult PT Treatment:                                                DATE: 01/13/23 Therapeutic Exercise: UBE Level 2,  2 min  each way  Standing row Green band x 15 - increased pain Supine serratus dowel lifts x 15  Supine alternating diagonals  red  x 15  Supine horiz abdct Red x 15  Prone retraction with extension x 10 Prone W back x 10 Reviewed and condensed HEP  [facility] Adult PT Treatment:                                                DATE: 01/06/23 Therapeutic Exercise: UBE 2 mins fwd and bwd Supine chin tuck x10 3' Supine DNF lift offs x10 5" Cervical SB 2x 15 sec c gentle pt over pressure Cervical rot 2x 15 sec c gentle pt over pressure Self Care: Pt Ed for the use of theracare and tennis c wall massage to addressTrPs and taut muscle bands Manual Therapy: STM to the R upper trap and mid back paraspinals Trigger Point Dry Needling  Treatment: Pre-treatment instruction: Patient instructed on dry needling rationale, procedures, and possible side effects including pain during treatment (achy,cramping feeling), bruising, drop of blood, lightheadedness, nausea, sweating. Patient Consent Given: Yes Education handout provided: Yes Muscles treated: R upper  trap  Needle size and number: .30x31mm x 1 Electrical stimulation performed: No Parameters: N/A Treatment response/outcome: Twitch response elicited and Palpable decrease in muscle tension Post-treatment instructions: Patient instructed to expect possible mild to moderate muscle soreness later today and/or tomorrow. Patient instructed in methods to reduce muscle soreness and to continue prescribed HEP. If patient was dry needled over the lung field, patient was instructed on signs and symptoms of pneumothorax and, however unlikely, to see immediate medical attention should they occur. Patient was also educated on signs and symptoms of infection and to seek medical attention should they occur. Patient verbalized understanding of these instructions  and education.   PATIENT EDUCATION:  Education details: Eval findings, POC, HEP, self care  Person educated: Patient Education method: Explanation, Demonstration, Tactile cues, Verbal cues, and Handouts Education comprehension: verbalized understanding, returned demonstration, verbal cues required, tactile cues required, and needs further education  HOME EXERCISE PROGRAM: Access Code: [email or web address] URL: [email or web address] Date: 01/06/2023 Prepared by: Joellyn Rued  Exercises - Standing 'L' Stretch at Counter  - 2 x daily - 7 x weekly - 1 sets - 10 reps - 5-20 hold - Shoulder Flexion Wall Slide with Towel  - 2 x daily - 7 x weekly - 1 sets - 10 reps - 5=10 hold - Supine Posterior Pelvic Tilt  - 2 x daily - 7 x weekly - 1 sets - 10 reps - 3 hold - Supine Shoulder Flexion Extension AAROM with Dowel  - 2 x daily - 7 x  weekly - 1 sets - 10 reps - Standing Anti-Rotation Press with Anchored Resistance  - 2 x daily - 7 x weekly - 1 sets - 10 reps - 3 hold - Seated Upper Trapezius Stretch  - 1 x daily - 7 x weekly - 1 sets - 3 reps - 15-30 hold - Gentle Levator Scapulae Stretch  - 1 x daily - 7 x weekly - 1 sets - 3 reps - 15-30 hold - Hooklying Rib Cage Breathing  - 2 x daily - 7 x weekly - 2 sets - 5 reps - 5 hold - Hooklying Isometric Hip Flexion  - 1 x daily - 7 x weekly - 2 sets - 5 reps - 5 hold - Hooklying Isometric Hip Flexion with Opposite Arm  - 1 x daily - 7 x weekly - 2 sets - 5 reps - 5 hold - Standing March with Alternating [facility]  - 1 x daily - 7 x weekly - 3 sets - 10 reps - Supine Cervical Retraction with Towel  - 1 x daily - 7 x weekly - 1 sets - 10 reps - 3 hold - Supine Deep Neck Flexor Training - Repetitions  - 1 x daily - 7 x weekly - 1 sets - 10 reps - 5 hold - Seated Cervical Sidebending Stretch  - 1 x daily - 7 x weekly - 2 sets - 10 reps - 15 hold - Standing Cervical Rotation AROM with Overpressure (Mirrored)  - 1 x daily - 7 x weekly - 2 sets - 10 reps - 15 hold  ASSESSMENT:  CLINICAL IMPRESSION: PT was completed for upper body/trunk strengthening with work related lifting tasks and also per isotonic weight machines. Began Suitcase carry with good tolerance. Pt reported no increased pain and felt the upper trap actually loosened up with the exercises.  She has 2 more authorized visits. 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  OBJECTIVE IMPAIRMENTS: decreased activity tolerance, decreased ROM, decreased strength, increased muscle spasms, and pain.   ACTIVITY LIMITATIONS: carrying, lifting, dressing, reach over head, and caring for others  PARTICIPATION LIMITATIONS: meal prep, cleaning, laundry, driving, occupation, and yard  work  PERSONAL FACTORS: Time since onset of injury/illness/exacerbation are also affecting patient's functional outcome.   REHAB POTENTIAL: Excellent  CLINICAL DECISION MAKING: Stable/uncomplicated  EVALUATION COMPLEXITY: Low   GOALS:  SHORT TERM GOALS: Target date: 09/26/22  Pt will be Ind in an initial HEP  Baseline: started Goal status: MET  2.  Pt will voice understanding of measures to assist in pain reduction  Baseline: started 09/25/22: use of cold pack, moist heat, knee bolster for sleep position Goal status: MET   LONG TERM GOALS: Target date: 03/13/23  Pt will be Ind in a final HEP to maintain achieved LOF  Baseline: started Goal status: ONGOING  2.  Increase R shoulder flexion and abd to 145d for appropriate upper body function Baseline: 100d for both 10/07/22: Flex 130; abd 120 11/14/22: Flex 140; abd 128 12/05/22: abd 138 12/23/22: 145 flexion 01/13/23: 145 flexion and abduction Goal status: MET   3.  Pt will report a decrease in pain to 0-3/10 with daily and work related activities form improved QOL and to return back to work Baseline: 3-8/10 See pain scale above 12/23/22: 3-4/10 rest, up to 5-6/10 with activity 01/13/23: 3 or less for abdomen Goal status: MET for abdominal pain, Not met for shoulder   4.  Increase R shoulder strength to 4+/5 or greater for improved R UE function for return to work  Baseline: See flow sheets 11/03/22: see chart, partially me 11/14/22: see flow sheet Goal status: MET  5.  Pt will be able to handle 15#, lifting from waist to shoulder height 2 set of 10  Baseline: not tested 10/14/22: 3# and 5# waist to shoulder height x10 each 11/03/22: can lift 15# dumb bell using both UE from counter to should height shelf 1 x 10 12/05/22: can lift 15# dumb bell using both UE from counter to should height shelf 2 x 10 01/20/23: 10# 2x10 R UE 02/06/23: 10# 2x10 R UE, 15# bilat UE Goal status: Improved-not yet met for 1 hand. MET for 2  hands  6.  Pt will be able to handle 75#, lifting from floor to waist height  2 sets of 10 Baseline: not tested 10/17/22: 10# hinged hip lifting 11/03/22: 25 # hinged hip lifting  12/23/22: 25# hinged hip lifting  01/20/23: 30# hinged hip lifting 02/06/23: 45# hinged hip lifting and dead lifts Goal status: Improving-Not yet met  7.  Pt's FOTO score will improved to the predicted value of 57% as indication of improved function  Baseline: 31% 10/09/22: 38%  11/14/22: 42% 12/23/22: 43% 02/06/23: 40% Goal status: Ongoing- Not yet met  PLAN:  PT FREQUENCY: 2x per week for 5 more weeks  PT DURATION: 6 weeks  PLANNED INTERVENTIONS: Therapeutic exercises, Therapeutic activity, Patient/Family education, Self Care, Joint mobilization, Dry Needling, Electrical stimulation, Cryotherapy, Moist heat, Taping, Ultrasound, Ionotophoresis 4mg /ml Dexamethasone, Manual therapy, and Re-evaluation.  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  Check all possible CPT codes: 23557 - PT Re-evaluation, 97110- Therapeutic Exercise, 97140 - Manual Therapy, 97530 - Therapeutic Activities, 97535 - Self Care, [phone] - Electrical stimulation (Manual), [id number] - Ultrasound, and [id number] - Aquatic therapy                         Check all conditions that are expected to impact treatment: {Conditions expected to impact treatment:Morbid obesity and Musculoskeletal disorders     Jannette Spanner, PTA 02/17/23 11:42 AM Phone: [phone] Fax: [phone]

## 2023-02-24 ENCOUNTER — Ambulatory Visit: Payer: BC Managed Care – PPO

## 2023-02-27 ENCOUNTER — Ambulatory Visit: Payer: BC Managed Care – PPO | Attending: Internal Medicine | Admitting: Physical Therapy

## 2023-02-27 ENCOUNTER — Telehealth: Payer: Self-pay | Admitting: Physical Therapy

## 2023-02-27 DIAGNOSIS — R252 Cramp and spasm: Secondary | ICD-10-CM | POA: Insufficient documentation

## 2023-02-27 DIAGNOSIS — M546 Pain in thoracic spine: Secondary | ICD-10-CM | POA: Insufficient documentation

## 2023-02-27 NOTE — Telephone Encounter (Signed)
 Left voicemail regarding no show to appointment. Left next appointment dates and times.

## 2023-03-01 NOTE — Therapy (Signed)
 OUTPATIENT PHYSICAL THERAPY THORACOLUMBAR TREATMENT NOTE   Patient Name: Kristen [name] MRN: [id number] DOB:Jun 05, 1980, 43 y.o., female Today's Date: 03/03/2023  END OF SESSION:  PT End of Session - 03/03/23 1108     Visit Number 25    Number of Visits 30    Date for PT Re-Evaluation 03/13/23    Authorization Type [employer] COMM PPO/ [employer] [place] Medicaid    Authorization Time Period 02/10/23-03/10/22    Authorization - Visit Number 3    Authorization - Number of Visits 4    PT Start Time 1107    PT Stop Time 1145    PT Time Calculation (min) 38 min    Activity Tolerance Patient tolerated treatment well    Behavior During Therapy WFL for tasks assessed/performed                    Past Medical History:  Diagnosis Date   Alpha thalassemia (HCC)    Asthma    uses inhaler daily/as needed   Complication of anesthesia    reaction to medication in epidural -morphine    Endometriosis    Preterm labor with preterm delivery    Seasonal allergies    Tubular adenoma of colon    Past Surgical History:  Procedure Laterality Date   CESAREAN SECTION     CESAREAN SECTION N/A 10/29/2015   Procedure: CESAREAN SECTION;  Surgeon: Lynwood KANDICE Solomons, MD;  Location: [facility] BIRTHING SUITES;  Service: Obstetrics;  Laterality: N/A;   DILATION AND CURETTAGE OF UTERUS     WISDOM TOOTH EXTRACTION  2014   Patient Active Problem List   Diagnosis Date Noted   Headache 12/02/2022   Nausea 12/02/2022   Sprain of abdominal wall 09/23/2022   Vitamin D  deficiency 07/10/2022   Microcytosis 07/10/2022   Alpha thalassemia (HCC) 07/10/2022   Obesity (BMI 30.0-34.9) 07/09/2022   Hypnagogic hallucinations 10/22/2016   Gestational diabetes mellitus (GDM) affecting fifth pregnancy 10/22/2016   Unwanted fertility 09/17/2016   Bowel trouble 02/07/2016   Constipation 02/07/2016    PCP: Theophilus Andrews, Tully GRADE, MD   REFERRING PROVIDER: Theophilus Andrews, Tully GRADE, MD   REFERRING DIAG: [phone]  (ICD-10-CM) - Strain of abdominal wall, initial encounter   Rationale for Evaluation and Treatment: Rehabilitation  THERAPY DIAG:  Pain in thoracic spine  Cramp and spasm  ONSET DATE: June 14th, 2024  SUBJECTIVE:                                                                                                                                                                                           SUBJECTIVE STATEMENT: 03/03/23: Pain is better than  last time in PT. I'm doing more for longer periods of time. Pt is to return to work on February 3rd.  EVAL: Pt reports she strained her R ant/lat chest/abdominal area when lifting a package at [employer]. Pt notes the pain can radiate to her R back and her R upper shoulder hurts as well. At the time of the injury, the pt states the pain was intense and she felt like she was going to throw up. Pt indicates there was an area of swelling which ran along her ant/lat chest from under her R breast to her last rib. Following the injury, she worked on [employer] duty (small sort) for 1 week. Currently she is out of work on [employer]. She can return to work on light duty when she can tolerate lifting 15 lbs for 3.5 hours. In addition to pain, pt reports muscle spasms in the area. Pt has 6 children.  Pt is Rt handed  PERTINENT HISTORY:  NA  PAIN:  Are you having pain? Yes: NPRS scale: R upper trap 3/10. R side 2/10.  Pain location: R upper trap  Pain description:achy Aggravating factors: HEP, driving, sweeping, pushing large grocery cart  Relieving factors: Cold pack, hot bath soaks Pain range on eval: 3-8/10; pain range 12/23/22: 3-6/10  PRECAUTIONS: None  WEIGHT BEARING RESTRICTIONS: No  FALLS:  Has patient fallen in last 6 months? 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  Pt's need to be able to lift 75# to return to full duty  PLOF: Independent  PATIENT GOALS: Pain relief and to return to work  OBJECTIVE:   DIAGNOSTIC FINDINGS:  NA  PATIENT SURVEYS:  FOTO: Perceived function   31%, predicted   57%  FOTO 38% 10/09/22 FOTO 42% 11/14/22 FOTO 43% 12/23/22 FOTO 40% 02/06/23  SCREENING FOR RED FLAGS: Bowel or bladder incontinence:   COGNITION: Overall cognitive status: Within functional limits for tasks assessed     SENSATION: WFL  MUSCLE LENGTH: Hamstrings: Right NT deg; Left NT deg Debby test: Right NT deg; Left NT deg  POSTURE: rounded shoulders and forward head  PALPATION: TTP to the ant/lat R rib cage from the 5th to 12th ribs and of the R upper trap  UE MMT:  MMT Right 09/04/22 Left 09/04/22 Right  11/03/22 Rt 11/14/22  Shoulder flexion 4p 5 4 4+  Shoulder extension 4p 5    Shoulder abduction 4p 5 4 4+  Shoulder adduction 4p 5    Shoulder extension 4p 5    Shoulder internal rotation 4p 5 5   Shoulder external rotation 4p 5 4+ p in right side 4+ p rt side  Elbow flexion 5 5    Elbow extension 5 5    Wrist flexion      Wrist extension      Wrist ulnar deviation      Wrist radial deviation      Wrist pronation      Wrist supination       (Blank rows = not tested) P=denotes concordant pain  UE ROM:  AROM Right 09/04/22 Left 09/04/22 RT 10/07/22 RT 11/03/22 RT 11/14/22 RT 12/05/22 RT 12/23/22 RT 01/13/23  Shoulder flexion 100 150 130 128 140d p 132 A 145d  145  Shoulder extension          Shoulder abduction 100 150 120 120 128d  p 138 A 140d 145  Shoulder adduction          Shoulder extension          Shoulder internal rotation          Shoulder external rotation          Middle trapezius          Lower trapezius          Elbow flexion          Elbow extension          Wrist flexion          Wrist extension          Wrist ulnar deviation          Wrist radial deviation          Wrist pronation          Wrist  supination          Grip strength           (Blank rows = not tested)    P=denotes concordant pain  TODAY'S TREATMENT: [facility] Adult PT Treatment:                                                DATE: 03/03/23 Therapeutic Exercise: UBE 2 mins fwd and bwd Shoulder row FM 20# each UE 2x10 D1 complex dynamic lifts c step FM bilat 10# 2x10 Hinged hip lifting 55# 2x10 Deadlift 55# 2 x 10  Suitcase Carry 20# 100 ft each arm x2  [facility] Adult PT Treatment:                                                DATE: 02/17/23 Therapeutic Exercise: UBE 2 mins fwd and bwd Hinged hip lifting 2x10 45# Dead lifts 2x5 45# Suitcase Carry 15# 100 Ft each arm Suitcase Carry 20# 100 ft each arm  Deadlift 45# 2 x 6  FM row 20# 2 x 10 Chops 13# x 6 each  Shoulder pull downs  17#  Single arm lifts waist to shoulder height, then overhead height 2x10 10#  [facility] Adult PT Treatment:                                                DATE: 02/10/23 Therapeutic Exercise: UBE 2 mins fwd and bwd Single arm lifts waist to shoulder height, then overhead height 2x10 10# Bilat arm lifts waist 2x10 20# for shoulder height then 15# for overhead Hinged hip lifting 2x10 45# Dead lifts 2x5 45# Omega shoulder row 2x12 45# Omega lat pull 2x12 35# Omega chest press 2x12 45#  PATIENT EDUCATION:  Education details: Eval findings, POC, HEP, self care  Person educated: Patient Education method: Explanation, Demonstration, Tactile cues, Verbal cues, and Handouts Education comprehension: verbalized understanding, returned demonstration, verbal cues required, tactile cues required, and needs further education  HOME EXERCISE PROGRAM: Access Code: [email or web address] URL: [email or web address] Date: 01/06/2023 Prepared by: Dasie Daft  Exercises - Standing 'L' Stretch at Counter  - 2 x daily - 7 x weekly - 1 sets - 10  reps - 5-20 hold - Shoulder Flexion Wall Slide with Towel  - 2 x daily - 7 x weekly - 1 sets - 10 reps - 5=10  hold - Supine Posterior Pelvic Tilt  - 2 x daily - 7 x weekly - 1 sets - 10 reps - 3 hold - Supine Shoulder Flexion Extension AAROM with Dowel  - 2 x daily - 7 x weekly - 1 sets - 10 reps - Standing Anti-Rotation Press with Anchored Resistance  - 2 x daily - 7 x weekly - 1 sets - 10 reps - 3 hold - Seated Upper Trapezius Stretch  - 1 x daily - 7 x weekly - 1 sets - 3 reps - 15-30 hold - Gentle Levator Scapulae Stretch  - 1 x daily - 7 x weekly - 1 sets - 3 reps - 15-30 hold - Hooklying Rib Cage Breathing  - 2 x daily - 7 x weekly - 2 sets - 5 reps - 5 hold - Hooklying Isometric Hip Flexion  - 1 x daily - 7 x weekly - 2 sets - 5 reps - 5 hold - Hooklying Isometric Hip Flexion with Opposite Arm  - 1 x daily - 7 x weekly - 2 sets - 5 reps - 5 hold - Standing March with Alternating [facility]  - 1 x daily - 7 x weekly - 3 sets - 10 reps - Supine Cervical Retraction with Towel  - 1 x daily - 7 x weekly - 1 sets - 10 reps - 3 hold - Supine Deep Neck Flexor Training - Repetitions  - 1 x daily - 7 x weekly - 1 sets - 10 reps - 5 hold - Seated Cervical Sidebending Stretch  - 1 x daily - 7 x weekly - 2 sets - 10 reps - 15 hold - Standing Cervical Rotation AROM with Overpressure (Mirrored)  - 1 x daily - 7 x weekly - 2 sets - 10 reps - 15 hold  ASSESSMENT:  CLINICAL IMPRESSION: PT was completed for upper body/trunk strengthening with work related lifting tasks with increased physical demand. Pt reported no increased pain. Pt was pleased with her being able to tolerate greater demand. She has 1 more authorized visits. 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  OBJECTIVE IMPAIRMENTS: decreased activity tolerance, decreased ROM, decreased strength, increased muscle spasms, and pain.   ACTIVITY LIMITATIONS: carrying, lifting, dressing, reach over head, and caring  for others  PARTICIPATION LIMITATIONS: meal prep, cleaning, laundry, driving, occupation, and yard work  PERSONAL FACTORS: Time since onset of injury/illness/exacerbation are also affecting patient's functional outcome.   REHAB POTENTIAL: Excellent  CLINICAL DECISION MAKING: Stable/uncomplicated  EVALUATION COMPLEXITY: Low   GOALS:  SHORT TERM GOALS: Target date: 09/26/22  Pt will be Ind in an initial HEP  Baseline: started Goal status: MET 2.  Pt will voice understanding of measures to assist in pain reduction  Baseline: started 09/25/22: use of cold pack, moist heat, knee bolster for sleep position Goal status: MET   LONG TERM GOALS: Target date: 03/13/23  Pt will be Ind in a final HEP to maintain achieved LOF  Baseline: started Goal status: ONGOING  2.  Increase R shoulder flexion and abd to 145d for appropriate upper body function Baseline: 100d for both 10/07/22: Flex 130; abd 120 11/14/22: Flex 140; abd 128 12/05/22: abd 138  12/23/22: 145 flexion 01/13/23: 145 flexion and abduction Goal status: MET   3.  Pt will report a decrease in pain to 0-3/10 with daily and work related activities form improved QOL and to return back to work Baseline: 3-8/10 See pain scale above 12/23/22: 3-4/10 rest, up to 5-6/10 with activity 01/13/23: 3 or less for abdomen Goal status: MET for abdominal pain, Not met for shoulder   4.  Increase R shoulder strength to 4+/5 or greater for improved R UE function for return to work  Baseline: See flow sheets 11/03/22: see chart, partially me 11/14/22: see flow sheet Goal status: MET  5.  Pt will be able to handle 15#, lifting from waist to shoulder height 2 set of 10  Baseline: not tested 10/14/22: 3# and 5# waist to shoulder height x10 each 11/03/22: can lift 15# dumb bell using both UE from counter to should height shelf 1 x 10 12/05/22: can lift 15# dumb bell using both UE from counter to should height shelf 2 x 10 01/20/23: 10# 2x10 R  UE 02/06/23: 10# 2x10 R UE, 15# bilat UE Goal status: Improved-not yet met for 1 hand. MET for 2 hands  6.  Pt will be able to handle 75#, lifting from floor to waist height  2 sets of 10 Baseline: not tested 10/17/22: 10# hinged hip lifting 11/03/22: 25 # hinged hip lifting  12/23/22: 25# hinged hip lifting  01/20/23: 30# hinged hip lifting 02/06/23: 45# hinged hip lifting and dead lifts Goal status: Improving-Not yet met  7.  Pt's FOTO score will improved to the predicted value of 57% as indication of improved function  Baseline: 31% 10/09/22: 38%  11/14/22: 42% 12/23/22: 43% 02/06/23: 40% Goal status: Ongoing- Not yet met  PLAN:  PT FREQUENCY: 2x per week for 5 more weeks  PT DURATION: 6 weeks  PLANNED INTERVENTIONS: Therapeutic exercises, Therapeutic activity, Patient/Family education, Self Care, Joint mobilization, Dry Needling, Electrical stimulation, Cryotherapy, Moist heat, Taping, Ultrasound, Ionotophoresis 4mg /ml Dexamethasone, Manual therapy, and Re-evaluation.  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  Check all possible CPT codes: 02835 - PT Re-evaluation, 97110- Therapeutic Exercise, 97140 - Manual Therapy, 97530 - Therapeutic Activities, [phone] - Self Care, [phone] - Electrical stimulation (Manual), [id number] - Ultrasound, and [id number] - Aquatic therapy                         Check all conditions that are expected to impact treatment: {Conditions expected to impact treatment:Morbid obesity and Musculoskeletal disorders     Dasie Daft MS, PT 03/03/23 1:02 PM

## 2023-03-03 ENCOUNTER — Ambulatory Visit: Payer: BC Managed Care – PPO

## 2023-03-03 DIAGNOSIS — R252 Cramp and spasm: Secondary | ICD-10-CM | POA: Diagnosis not present

## 2023-03-03 DIAGNOSIS — M546 Pain in thoracic spine: Secondary | ICD-10-CM

## 2023-03-05 ENCOUNTER — Ambulatory Visit: Payer: BC Managed Care – PPO | Admitting: Physical Therapy

## 2023-03-10 ENCOUNTER — Encounter: Payer: Self-pay | Admitting: Physical Therapy

## 2023-03-10 ENCOUNTER — Ambulatory Visit: Payer: BC Managed Care – PPO | Admitting: Physical Therapy

## 2023-03-10 DIAGNOSIS — R252 Cramp and spasm: Secondary | ICD-10-CM | POA: Diagnosis not present

## 2023-03-10 DIAGNOSIS — M546 Pain in thoracic spine: Secondary | ICD-10-CM | POA: Diagnosis not present

## 2023-03-10 NOTE — Therapy (Signed)
 OUTPATIENT PHYSICAL THERAPY THORACOLUMBAR TREATMENT NOTE   Patient Name: Kristen [name] MRN: [id number] DOB:07-31-1980, 43 y.o., female Today's Date: 03/10/2023  END OF SESSION:  PT End of Session - 03/10/23 1105     Visit Number 26    Number of Visits 30    Date for PT Re-Evaluation 03/13/23    Authorization Type [employer] COMM PPO/ [employer] [place] Medicaid    Authorization Time Period 02/10/23-03/10/22    Authorization - Visit Number 4    Authorization - Number of Visits 4    PT Start Time 1104    PT Stop Time 1145    PT Time Calculation (min) 41 min                    Past Medical History:  Diagnosis Date   Alpha thalassemia (HCC)    Asthma    uses inhaler daily/as needed   Complication of anesthesia    reaction to medication in epidural -morphine    Endometriosis    Preterm labor with preterm delivery    Seasonal allergies    Tubular adenoma of colon    Past Surgical History:  Procedure Laterality Date   CESAREAN SECTION     CESAREAN SECTION N/A 10/29/2015   Procedure: CESAREAN SECTION;  Surgeon: Lynwood KANDICE Solomons, MD;  Location: [facility] BIRTHING SUITES;  Service: Obstetrics;  Laterality: N/A;   DILATION AND CURETTAGE OF UTERUS     WISDOM TOOTH EXTRACTION  2014   Patient Active Problem List   Diagnosis Date Noted   Headache 12/02/2022   Nausea 12/02/2022   Sprain of abdominal wall 09/23/2022   Vitamin D  deficiency 07/10/2022   Microcytosis 07/10/2022   Alpha thalassemia (HCC) 07/10/2022   Obesity (BMI 30.0-34.9) 07/09/2022   Hypnagogic hallucinations 10/22/2016   Gestational diabetes mellitus (GDM) affecting fifth pregnancy 10/22/2016   Unwanted fertility 09/17/2016   Bowel trouble 02/07/2016   Constipation 02/07/2016    PCP: Theophilus Andrews, Tully GRADE, MD   REFERRING PROVIDER: Theophilus Andrews, Tully GRADE, MD   REFERRING DIAG: [phone] (ICD-10-CM) - Strain of abdominal wall, initial encounter   Rationale for Evaluation and Treatment:  Rehabilitation  THERAPY DIAG:  Pain in thoracic spine  Cramp and spasm  ONSET DATE: June 14th, 2024  SUBJECTIVE:                                                                                                                                                                                           SUBJECTIVE STATEMENT: 03/10/23: Shoulder Pain is better on the top. One spot that still bothers me a lot, right below upper trap. My side is feeling  better I can do  20-30 min exercise routines.  Pt is to return to work on February 3rd. I can almost sleep through the night.   EVAL: Pt reports she strained her R ant/lat chest/abdominal area when lifting a package at [employer]. Pt notes the pain can radiate to her R back and her R upper shoulder hurts as well. At the time of the injury, the pt states the pain was intense and she felt like she was going to throw up. Pt indicates there was an area of swelling which ran along her ant/lat chest from under her R breast to her last rib. Following the injury, she worked on [employer] duty (small sort) for 1 week. Currently she is out of work on [employer]. She can return to work on light duty when she can tolerate lifting 15 lbs for 3.5 hours. In addition to pain, pt reports muscle spasms in the area. Pt has 6 children.  Pt is Rt handed  PERTINENT HISTORY:  NA  PAIN:  Are you having pain? Yes: NPRS scale: 5/10 R superior scap.  Pain location: R superior scap Pain description:achy Aggravating factors: HEP, driving, sweeping, pushing large grocery cart  Relieving factors: Cold pack, hot bath soaks Pain range at worst 5/10 superior scap; pain at worst on side 2/10   PRECAUTIONS: None  WEIGHT BEARING RESTRICTIONS: No  FALLS:  Has patient fallen in last 6 months? 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 Pt's need to be able to lift 75# to return to full duty  PLOF: Independent  PATIENT GOALS: Pain relief and to return to work  OBJECTIVE:   DIAGNOSTIC FINDINGS:  NA  PATIENT SURVEYS:  FOTO: Perceived function   31%, predicted   57%  FOTO 38% 10/09/22 FOTO 42% 11/14/22 FOTO 43% 12/23/22 FOTO 40% 02/06/23 FOTO Ribs: 52% 03/10/23:  SCREENING FOR RED FLAGS: Bowel or bladder incontinence:   COGNITION: Overall cognitive status: Within functional limits for tasks assessed     SENSATION: WFL  MUSCLE LENGTH: Hamstrings: Right NT deg; Left NT deg Debby test: Right NT deg; Left NT deg  POSTURE: rounded shoulders and forward head  PALPATION: TTP to the ant/lat R rib cage from the 5th to 12th ribs and of the R upper trap  UE MMT:  MMT Right 09/04/22 Left 09/04/22 Right  11/03/22 Rt 11/14/22 RT 03/10/23  Shoulder flexion 4p 5 4 4+ 4+ pain top of shoulder  Shoulder extension 4p 5     Shoulder abduction 4p 5 4 4+ 4+ top of shoulder  Shoulder adduction 4p 5     Shoulder extension 4p 5     Shoulder internal rotation 4p 5 5    Shoulder external rotation 4p 5 4+ p in right side 4+ p rt side 4+ no pain  Elbow flexion 5 5     Elbow extension 5 5     Wrist flexion       Wrist extension       Wrist ulnar deviation       Wrist radial deviation       Wrist pronation       Wrist supination        (Blank rows = not tested) P=denotes concordant pain  UE ROM:  AROM Right 09/04/22 Left 09/04/22 RT 10/07/22 RT 11/03/22 RT 11/14/22 RT 12/05/22  RT 12/23/22 RT 01/13/23 RT 03/10/23  Shoulder flexion 100 150 130 128 140d p 132 A 145d  145 157  Shoulder extension           Shoulder abduction 100 150 120 120 128d p 138 A 140d 145 155  Shoulder adduction           Shoulder extension           Shoulder internal rotation           Shoulder external rotation           Middle trapezius           Lower trapezius           Elbow flexion           Elbow extension            Wrist flexion           Wrist extension           Wrist ulnar deviation           Wrist radial deviation           Wrist pronation           Wrist supination           Grip strength            (Blank rows = not tested)    P=denotes concordant pain  TODAY'S TREATMENT: [facility] Adult PT Treatment:                                                DATE: 03/10/23 Therapeutic Exercise: Prone ITY x 10 each thumbs up  Seated levator stretch, upper trap stretch  Therapeutic Activity: Lifting waist to shoulder height 15# using bil UE x 10, 20# x 10 20# OH press from chest height using bil UE x 10 33# total box lift to waist x 10     [facility] Adult PT Treatment:                                                DATE: 03/03/23 Therapeutic Exercise: UBE 2 mins fwd and bwd Shoulder row FM 20# each UE 2x10 D1 complex dynamic lifts c step FM bilat 10# 2x10 Hinged hip lifting 55# 2x10 Deadlift 55# 2 x 10  Suitcase Carry 20# 100 ft each arm x2  [facility] Adult PT Treatment:                                                DATE: 02/17/23 Therapeutic Exercise: UBE 2 mins fwd and bwd Hinged hip lifting 2x10 45# Dead lifts 2x5 45# Suitcase Carry 15# 100 Ft each arm Suitcase Carry 20# 100 ft each arm  Deadlift 45# 2 x 6  FM row 20# 2 x 10 Chops 13# x 6 each  Shoulder pull downs  17#  Single arm lifts waist to shoulder height, then overhead height 2x10 10#  [facility] Adult PT Treatment:  DATE: 02/10/23 Therapeutic Exercise: UBE 2 mins fwd and bwd Single arm lifts waist to shoulder height, then overhead height 2x10 10# Bilat arm lifts waist 2x10 20# for shoulder height then 15# for overhead  Hinged hip lifting 2x10 45# Dead lifts 2x5 45# Omega shoulder row 2x12 45# Omega lat pull 2x12 35# Omega chest press 2x12 45#  PATIENT EDUCATION:  Education details: Eval findings, POC, HEP, self care  Person educated: Patient Education method: Explanation,  Demonstration, Tactile cues, Verbal cues, and Handouts Education comprehension: verbalized understanding, returned demonstration, verbal cues required, tactile cues required, and needs further education  HOME EXERCISE PROGRAM: Access Code: [email or web address] URL: [email or web address] Date: 01/06/2023 Prepared by: Dasie Daft  Exercises - Standing 'L' Stretch at Counter  - 2 x daily - 7 x weekly - 1 sets - 10 reps - 5-20 hold - Shoulder Flexion Wall Slide with Towel  - 2 x daily - 7 x weekly - 1 sets - 10 reps - 5=10 hold - Supine Posterior Pelvic Tilt  - 2 x daily - 7 x weekly - 1 sets - 10 reps - 3 hold - Supine Shoulder Flexion Extension AAROM with Dowel  - 2 x daily - 7 x weekly - 1 sets - 10 reps - Standing Anti-Rotation Press with Anchored Resistance  - 2 x daily - 7 x weekly - 1 sets - 10 reps - 3 hold - Seated Upper Trapezius Stretch  - 1 x daily - 7 x weekly - 1 sets - 3 reps - 15-30 hold - Gentle Levator Scapulae Stretch  - 1 x daily - 7 x weekly - 1 sets - 3 reps - 15-30 hold - Hooklying Rib Cage Breathing  - 2 x daily - 7 x weekly - 2 sets - 5 reps - 5 hold - Hooklying Isometric Hip Flexion  - 1 x daily - 7 x weekly - 2 sets - 5 reps - 5 hold - Hooklying Isometric Hip Flexion with Opposite Arm  - 1 x daily - 7 x weekly - 2 sets - 5 reps - 5 hold - Standing March with Alternating Med [facility]  - 1 x daily - 7 x weekly - 3 sets - 10 reps - Supine Cervical Retraction with Towel  - 1 x daily - 7 x weekly - 1 sets - 10 reps - 3 hold - Supine Deep Neck Flexor Training - Repetitions  - 1 x daily - 7 x weekly - 1 sets - 10 reps - 5 hold - Seated Cervical Sidebending Stretch  - 1 x daily - 7 x weekly - 2 sets - 10 reps - 15 hold - Standing Cervical Rotation AROM with Overpressure (Mirrored)  - 1 x daily - 7 x weekly - 2 sets - 10 reps - 15 hold  ASSESSMENT:  CLINICAL IMPRESSION: PT was completed for upper body/trunk strengthening with work related lifting tasks with increased  physical demand. Pt reported increased pain in upper shoulder. No increased pain in side/flank area. FOTO score for trunk/Ribs has improved, nearing goal. She is able to lift 20# waist to shoulder, meeting LTG# 5 met. Today she can lift 33# from floor to waist. Her job duties require her to lift 75# from floor to waist, onto a conveyor belt. She is also required to stack packages over head. Pt will continue to benefit from skilled PT to address impairments for improved function for return to work as a designer, television/film set or her highest level of functioning ability  so will request 1 x per week for 4 more weeks   OBJECTIVE IMPAIRMENTS: decreased activity tolerance, decreased ROM, decreased strength, increased muscle spasms, and pain.   ACTIVITY LIMITATIONS: carrying, lifting, dressing, reach over head, and caring for others  PARTICIPATION LIMITATIONS: meal prep, cleaning, laundry, driving, occupation, and yard work  PERSONAL FACTORS: Time since onset of injury/illness/exacerbation are also affecting patient's functional outcome.   REHAB POTENTIAL: Excellent  CLINICAL DECISION MAKING: Stable/uncomplicated  EVALUATION COMPLEXITY: Low   GOALS:  SHORT TERM GOALS: Target date: 09/26/22  Pt will be Ind in an initial HEP  Baseline: started Goal status: MET 2.  Pt will voice understanding of measures to assist in pain reduction  Baseline: started 09/25/22: use of cold pack, moist heat, knee bolster for sleep position Goal status: MET   LONG TERM GOALS: Target date: 03/13/23  Pt will be Ind in a final HEP to maintain achieved LOF  Baseline: started Goal status: ONGOING  2.  Increase R shoulder flexion and abd to 145d for appropriate upper body function Baseline: 100d for both 10/07/22: Flex 130; abd 120 11/14/22: Flex 140; abd 128 12/05/22: abd 138 12/23/22: 145 flexion 01/13/23: 145 flexion and abduction Goal status: MET   3.  Pt will report a decrease in pain to 0-3/10 with daily  and work related activities form improved QOL and to return back to work Baseline: 3-8/10 See pain scale above 12/23/22: 3-4/10 rest, up to 5-6/10 with activity Mar 26, 2023: 5/10 for shoulder, 2/10 for abdomen GOAL status: Partially met   Goal status: MET for abdominal pain, Not met for shoulder   4.  Increase R shoulder strength to 4+/5 or greater for improved R UE function for return to work  Baseline: See flow sheets 11/03/22: see chart, partially me 11/14/22: see flow sheet Goal status: MET  5.  Pt will be able to handle 15#, lifting from waist to shoulder height 2 set of 10  Baseline: not tested 10/14/22: 3# and 5# waist to shoulder height x10 each 11/03/22: can lift 15# dumb bell using both UE from counter to should height shelf 1 x 10 12/05/22: can lift 15# dumb bell using both UE from counter to should height shelf 2 x 10 01/20/23: 10# 2x10 R UE 02/06/23: 10# 2x10 R UE, 15# bilat UE 26-Mar-2023: 20# using bilateral UE Goal status:MET   6.  Pt will be able to handle 75#, lifting from floor to waist height  2 sets of 10 Baseline: not tested 10/17/22: 10# hinged hip lifting 11/03/22: 25 # hinged hip lifting  12/23/22: 25# hinged hip lifting  01/20/23: 30# hinged hip lifting 02/06/23: 45# hinged hip lifting and dead lifts 03/26/03: can lift 33# floor to waist 1 set of 10 Goal status: Improving-Not yet met  7.  Pt's FOTO score will improved to the predicted value of 57% as indication of improved function  Baseline: 31% 10/09/22: 38%  11/14/22: 42% 12/23/22: 43% 02/06/23: 40% 03-26-23: 52%  Goal status: Improving-NOT YET met  PLAN:  PT FREQUENCY: 2x per week for 5 more weeks  PT DURATION: 6 weeks  PLANNED INTERVENTIONS: Therapeutic exercises, Therapeutic activity, Patient/Family education, Self Care, Joint mobilization, Dry Needling, Electrical stimulation, Cryotherapy, Moist heat, Taping, Ultrasound, Ionotophoresis 4mg /ml Dexamethasone, Manual therapy, and Re-evaluation.  PLAN FOR  NEXT SESSION: TPDN PRN, scapular stabilization, progressive dynamic strengthening to prepare pt for RTW as a line package handler/loader or to reach her highest functioning level.  Check all possible CPT codes: 02835 - PT Re-evaluation, 97110-  Therapeutic Exercise, 97140 - Manual Therapy, 97530 - Therapeutic Activities, [phone] - Self Care, [phone] - Electrical stimulation (Manual), [id number] - Ultrasound, and [id number] - Aquatic therapy                         Check all conditions that are expected to impact treatment: {Conditions expected to impact treatment:Morbid obesity and Musculoskeletal disorders     Harlene Persons, PTA 03/10/23 12:52 PM Phone: [phone] Fax: [phone]

## 2023-03-12 ENCOUNTER — Ambulatory Visit: Payer: BC Managed Care – PPO | Admitting: Physical Therapy

## 2023-03-16 NOTE — Therapy (Signed)
OUTPATIENT PHYSICAL THERAPY THORACOLUMBAR TREATMENT NOTE   Patient Name: Kristen [name] MRN: [id number] DOB:April 19, 1980, 43 y.o., female Today's Date: 03/17/2023  END OF SESSION:  PT End of Session - 03/17/23 1233     Visit Number 27    Number of Visits 34    Date for PT Re-Evaluation 04/28/23    Authorization Type [employer] COMM PPO/ [employer]    Authorization Time Period Approved 4 visits 03/10/23-04/08/23    Authorization - Visit Number 1    Authorization - Number of Visits 4    PT Start Time 1100    PT Stop Time 1155    PT Time Calculation (min) 55 min    Activity Tolerance Patient tolerated treatment well    Behavior During Therapy WFL for tasks assessed/performed                     Past Medical History:  Diagnosis Date   Alpha thalassemia (HCC)    Asthma    uses inhaler daily/as needed   Complication of anesthesia    reaction to medication in epidural -morphine   Endometriosis    Preterm labor with preterm delivery    Seasonal allergies    Tubular adenoma of colon    Past Surgical History:  Procedure Laterality Date   CESAREAN SECTION     CESAREAN SECTION N/A 10/29/2015   Procedure: CESAREAN SECTION;  Surgeon: Adam Phenix, MD;  Location: [facility] BIRTHING SUIT[facility];  Service: Obstetrics;  Laterality: N/A;   DILATION AND CURETTAGE OF UTERUS     WISDOM TOOTH EXTRACTION  2014   Patient Active Problem List   Diagnosis Date Noted   Headache 12/02/2022   Nausea 12/02/2022   Sprain of abdominal wall 09/23/2022   Vitamin D deficiency 07/10/2022   Microcytosis 07/10/2022   Alpha thalassemia (HCC) 07/10/2022   Obesity (BMI 30.0-34.9) 07/09/2022   Hypnagogic hallucinations 10/22/2016   Gestational diabetes mellitus (GDM) affecting fifth pregnancy 10/22/2016   Unwanted fertility 09/17/2016   Bowel trouble 02/07/2016   Constipation 02/07/2016    PCP: Philip Aspen, Limmie Patricia, MD   REFERRING PROVIDER: Philip Aspen, Limmie Patricia,  MD   REFERRING DIAG: [id number] (ICD-10-CM) - Strain of abdominal wall, initial encounter   Rationale for Evaluation and Treatment: Rehabilitation  THERAPY DIAG:  Pain in thoracic spine  Cramp and spasm  ONSET DATE: June 14th, 2024  SUBJECTIVE:                                                                                                                                                                                           SUBJECTIVE STATEMENT: 03/17/23:  pt reports she has been a lot more physical activity at home. Mid back and R upper trap pain are low, 2/10  EVAL: Pt reports she strained her R ant/lat chest/abdominal area when lifting a package at [employer]. Pt notes the pain can radiate to her R back and her R upper shoulder hurts as well. At the time of the injury, the pt states the pain was intense and she felt like she was going to throw up. Pt indicates there was an area of swelling which ran along her ant/lat chest from under her R breast to her last rib. Following the injury, she worked on [employer] duty (small sort) for 1 week. Currently she is out of work on [employer]. She can return to work on light duty when she can tolerate lifting 15 lbs for 3.5 hours. In addition to pain, pt reports muscle spasms in the area. Pt has 6 children.  Pt is Rt handed  PERTINENT HISTORY:  NA  PAIN:  Are you having pain? Yes: NPRS scale: 2/10 R upper trap and mid back Pain location: R superior scap Pain description:achy Aggravating factors: HEP, driving, sweeping, pushing large grocery cart  Relieving factors: Cold pack, hot bath soaks Pain range at worst 5/10 superior scap; pain at worst on side 2/10   PRECAUTIONS: None  WEIGHT BEARING RESTRICTIONS: No  FALLS:  Has patient fallen in last 6 months? 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 Pt's need to be able to lift 75# to return to full duty  PLOF: Independent  PATIENT GOALS: Pain relief and to return to work  OBJECTIVE:   DIAGNOSTIC FINDINGS:  NA  PATIENT SURVEYS:  FOTO: Perceived function   31%, predicted   57%  FOTO 38% 10/09/22 FOTO 42% 11/14/22 FOTO 43% 12/23/22 FOTO 40% 02/06/23 FOTO Ribs: 52% 03/10/23:  SCREENING FOR RED FLAGS: Bowel or bladder incontinence:   COGNITION: Overall cognitive status: Within functional limits for tasks assessed     SENSATION: WFL  MUSCLE LENGTH: Hamstrings: Right NT deg; Left NT deg Maisie Fus test: Right NT deg; Left NT deg  POSTURE: rounded shoulders and forward head  PALPATION: TTP to the ant/lat R rib cage from the 5th to 12th ribs and of the R upper trap  UE MMT:  MMT Right 09/04/22 Left 09/04/22 Right  11/03/22 Rt 11/14/22 RT 03/10/23  Shoulder flexion 4p 5 4 4+ 4+ pain top of shoulder  Shoulder extension 4p 5     Shoulder abduction 4p 5 4 4+ 4+ top of shoulder  Shoulder adduction 4p 5     Shoulder extension 4p 5     Shoulder internal rotation 4p 5 5    Shoulder external rotation 4p 5 4+ p in right side 4+ p rt side 4+ no pain  Elbow flexion 5 5     Elbow extension 5 5     Wrist flexion       Wrist extension       Wrist ulnar deviation       Wrist radial deviation       Wrist pronation       Wrist supination        (Blank rows = not tested) P=denotes concordant pain  UE ROM:  AROM Right 09/04/22 Left 09/04/22 RT 10/07/22 RT 11/03/22 RT 11/14/22 RT 12/05/22 RT 12/23/22 RT  01/13/23 RT 03/10/23  Shoulder flexion 100 150 130 128 140d p 132 A 145d  145 157  Shoulder extension           Shoulder abduction 100 150 120 120 128d p 138 A 140d 145 155  Shoulder adduction           Shoulder extension           Shoulder internal rotation           Shoulder external rotation           Middle trapezius           Lower trapezius           Elbow flexion           Elbow  extension           Wrist flexion           Wrist extension           Wrist ulnar deviation           Wrist radial deviation           Wrist pronation           Wrist supination           Grip strength            (Blank rows = not tested)    P=denotes concordant pain  TODAY'S TREATMENT: [facility] Adult PT Treatment:                                                DATE: 03/17/23 Therapeutic Exercise: UBE 2 mins fwd and bwd L2 Chest pull 45# Lat pull 45# Bicep curl 20# Chest press 35# Therapeutic Activity:  Floor to waist lifts 45# 2x10 Floor to waist to chest lifts 40# 3x5 20# OH press from chest height using bil UE x 10 Modalities: PreMod Estim to the R upper trap and mid back x15 mins 19v intensity  [facility] Adult PT Treatment:                                                DATE: 03/10/23 Therapeutic Exercise: Prone ITY x 10 each thumbs up  Seated levator stretch, upper trap stretch  Therapeutic Activity: Lifting waist to shoulder height 15# using bil UE x 10, 20# x 10 20# OH press from chest height using bil UE x 10 33# total box lift to waist x 10   [facility] Adult PT Treatment:                                                DATE: 03/03/23 Therapeutic Exercise: UBE 2 mins fwd and bwd Shoulder row FM 20# each UE 2x10 D1 complex dynamic lifts c step FM bilat 10# 2x10 Hinged hip lifting 55# 2x10 Deadlift 55# 2 x 10  Suitcase Carry 20# 100 ft each arm x2  [facility] Adult PT Treatment:  DATE: 02/17/23 Therapeutic Exercise: UBE 2 mins fwd and bwd Hinged hip lifting 2x10 45# Dead lifts 2x5 45# Suitcase Carry 15# 100 Ft each arm Suitcase Carry 20# 100 ft each arm  Deadlift 45# 2 x 6  FM row 20# 2 x 10 Chops 13# x 6 each  Shoulder pull downs  17#  Single arm lifts waist to shoulder height, then overhead height 2x10 10#  [facility] Adult PT Treatment:                                                DATE: 02/10/23 Therapeutic Exercise: UBE 2 mins fwd  and bwd Single arm lifts waist to shoulder height, then overhead height 2x10 10# Bilat arm lifts waist 2x10 20# for shoulder height then 15# for overhead  Hinged hip lifting 2x10 45# Dead lifts 2x5 45# Omega shoulder row 2x12 45# Omega lat pull 2x12 35# Omega chest press 2x12 45#  PATIENT EDUCATION:  Education details: Eval findings, POC, HEP, self care  Person educated: Patient Education method: Explanation, Demonstration, Tactile cues, Verbal cues, and Handouts Education comprehension: verbalized understanding, returned demonstration, verbal cues required, tactile cues required, and needs further education  HOME EXERCISE PROGRAM: Access Code: [email or web address] URL: [email or web address] Date: 01/06/2023 Prepared by: Joellyn Rued  Exercises - Standing 'L' Stretch at Counter  - 2 x daily - 7 x weekly - 1 sets - 10 reps - 5-20 hold - Shoulder Flexion Wall Slide with Towel  - 2 x daily - 7 x weekly - 1 sets - 10 reps - 5=10 hold - Supine Posterior Pelvic Tilt  - 2 x daily - 7 x weekly - 1 sets - 10 reps - 3 hold - Supine Shoulder Flexion Extension AAROM with Dowel  - 2 x daily - 7 x weekly - 1 sets - 10 reps - Standing Anti-Rotation Press with Anchored Resistance  - 2 x daily - 7 x weekly - 1 sets - 10 reps - 3 hold - Seated Upper Trapezius Stretch  - 1 x daily - 7 x weekly - 1 sets - 3 reps - 15-30 hold - Gentle Levator Scapulae Stretch  - 1 x daily - 7 x weekly - 1 sets - 3 reps - 15-30 hold - Hooklying Rib Cage Breathing  - 2 x daily - 7 x weekly - 2 sets - 5 reps - 5 hold - Hooklying Isometric Hip Flexion  - 1 x daily - 7 x weekly - 2 sets - 5 reps - 5 hold - Hooklying Isometric Hip Flexion with Opposite Arm  - 1 x daily - 7 x weekly - 2 sets - 5 reps - 5 hold - Standing March with Alternating Med [facility]  - 1 x daily - 7 x weekly - 3 sets - 10 reps - Supine Cervical Retraction with Towel  - 1 x daily - 7 x weekly - 1 sets - 10 reps - 3 hold - Supine Deep Neck Flexor  Training - Repetitions  - 1 x daily - 7 x weekly - 1 sets - 10 reps - 5 hold - Seated Cervical Sidebending Stretch  - 1 x daily - 7 x weekly - 2 sets - 10 reps - 15 hold - Standing Cervical Rotation AROM with Overpressure (Mirrored)  - 1 x daily - 7 x weekly - 2 sets - 10 reps -  15 hold  ASSESSMENT:  CLINICAL IMPRESSION: PT was continued for upper body/trunk strengthening with work related lifting tasks with increased physical demand. Pt notes she is able to tolerate a lot more physical activity at home. Pt tolerated prescribed therex and activities today without adverse effects. PreMod Estim was provided at end of session for pt to experience how a TENs unit can help with pain management with her return to work. Pt continues to progress toward return to work which is scheduled for 03/30/23.  OBJECTIVE IMPAIRMENTS: decreased activity tolerance, decreased ROM, decreased strength, increased muscle spasms, and pain.   ACTIVITY LIMITATIONS: carrying, lifting, dressing, reach over head, and caring for others  PARTICIPATION LIMITATIONS: meal prep, cleaning, laundry, driving, occupation, and yard work  PERSONAL FACTORS: Time since onset of injury/illness/exacerbation are also affecting patient's functional outcome.   REHAB POTENTIAL: Excellent  CLINICAL DECISION MAKING: Stable/uncomplicated  EVALUATION COMPLEXITY: Low   GOALS:  SHORT TERM GOALS: Target date: 09/26/22  Pt will be Ind in an initial HEP  Baseline: started Goal status: MET 2.  Pt will voice understanding of measures to assist in pain reduction  Baseline: started 09/25/22: use of cold pack, moist heat, knee bolster for sleep position Goal status: MET   LONG TERM GOALS: Target date: 04/28/23  Pt will be Ind in a final HEP to maintain achieved LOF  Baseline: started Goal status: ONGOING  2.  Increase R shoulder flexion and abd to 145d for appropriate upper body function Baseline: 100d for both 10/07/22: Flex 130; abd  120 11/14/22: Flex 140; abd 128 12/05/22: abd 138 12/23/22: 145 flexion 01/13/23: 145 flexion and abduction Goal status: MET   3.  Pt will report a decrease in pain to 0-3/10 with daily and work related activities form improved QOL and to return back to work Baseline: 3-8/10 See pain scale above 12/23/22: 3-4/10 rest, up to 5-6/10 with activity 04/05/2023: 5/10 for shoulder, 2/10 for abdomen GOAL status: Partially met   Goal status: MET for abdominal pain, Not met for shoulder   4.  Increase R shoulder strength to 4+/5 or greater for improved R UE function for return to work  Baseline: See flow sheets 11/03/22: see chart, partially me 11/14/22: see flow sheet Goal status: MET  5.  Pt will be able to handle 15#, lifting from waist to shoulder height 2 set of 10  Baseline: not tested 10/14/22: 3# and 5# waist to shoulder height x10 each 11/03/22: can lift 15# dumb bell using both UE from counter to should height shelf 1 x 10 12/05/22: can lift 15# dumb bell using both UE from counter to should height shelf 2 x 10 01/20/23: 10# 2x10 R UE 02/06/23: 10# 2x10 R UE, 15# bilat UE 2023/04/05: 20# using bilateral UE Goal status:MET   6.  Pt will be able to handle 75#, lifting from floor to waist height  2 sets of 10 Baseline: not tested 10/17/22: 10# hinged hip lifting 11/03/22: 25 # hinged hip lifting  12/23/22: 25# hinged hip lifting  01/20/23: 30# hinged hip lifting 02/06/23: 45# hinged hip lifting and dead lifts 04/05/03: can lift 33# floor to waist 1 set of 10 Goal status: Improving-Not yet met  7.  Pt's FOTO score will improved to the predicted value of 57% as indication of improved function  Baseline: 31% 10/09/22: 38%  11/14/22: 42% 12/23/22: 43% 02/06/23: 40% April 05, 2023: 52%  Goal status: Improving-NOT YET met  PLAN:  PT FREQUENCY: 1x per week  PT DURATION: 8 weeks  PLANNED INTERVENTIONS: Therapeutic exercises, Therapeutic activity, Patient/Family education, Self Care, Joint  mobilization, Dry Needling, Electrical stimulation, Cryotherapy, Moist heat, Taping, Ultrasound, Ionotophoresis 4mg /ml Dexamethasone, Manual therapy, and Re-evaluation.  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  Check all possible CPT codes: 21308 - PT Re-evaluation, 97110- Therapeutic Exercise, 97140 - Manual Therapy, 97530 - Therapeutic Activities, [phone] - Self Care, [phone] - Electrical stimulation (Manual), [id number] - Ultrasound, and [id number] - Aquatic therapy                         Check all conditions that are expected to impact treatment: {Conditions expected to impact treatment:Morbid obesity and Musculoskeletal disorders     Joellyn Rued MS, PT 03/17/23 1:04 PM   Joellyn Rued MS, PT 03/18/23 8:17 AM

## 2023-03-17 ENCOUNTER — Ambulatory Visit: Payer: BC Managed Care – PPO

## 2023-03-17 DIAGNOSIS — R252 Cramp and spasm: Secondary | ICD-10-CM | POA: Diagnosis not present

## 2023-03-17 DIAGNOSIS — M546 Pain in thoracic spine: Secondary | ICD-10-CM

## 2023-03-17 NOTE — Patient Instructions (Signed)

## 2023-03-24 NOTE — Therapy (Signed)
OUTPATIENT PHYSICAL THERAPY THORACOLUMBAR TREATMENT NOTE   Patient Name: Kristen [name] MRN: [id number] DOB:May 06, 1980, 43 y.o., female Today's Date: 03/25/2023  END OF SESSION:  PT End of Session - 03/25/23 1143     Visit Number 28    Number of Visits 34    Date for PT Re-Evaluation 04/28/23    Authorization Time Period Approved 4 visits 03/10/23-04/08/23    Authorization - Visit Number 2    Authorization - Number of Visits 4    PT Start Time 1103    PT Stop Time 1145    PT Time Calculation (min) 42 min    Activity Tolerance Patient tolerated treatment well    Behavior During Therapy WFL for tasks assessed/performed                      Past Medical History:  Diagnosis Date   Alpha thalassemia (HCC)    Asthma    uses inhaler daily/as needed   Complication of anesthesia    reaction to medication in epidural -morphine   Endometriosis    Preterm labor with preterm delivery    Seasonal allergies    Tubular adenoma of colon    Past Surgical History:  Procedure Laterality Date   CESAREAN SECTION     CESAREAN SECTION N/A 10/29/2015   Procedure: CESAREAN SECTION;  Surgeon: Adam Phenix, MD;  Location: [facility] BIRTHING SUITES;  Service: Obstetrics;  Laterality: N/A;   DILATION AND CURETTAGE OF UTERUS     WISDOM TOOTH EXTRACTION  2014   Patient Active Problem List   Diagnosis Date Noted   Headache 12/02/2022   Nausea 12/02/2022   Sprain of abdominal wall 09/23/2022   Vitamin D deficiency 07/10/2022   Microcytosis 07/10/2022   Alpha thalassemia (HCC) 07/10/2022   Obesity (BMI 30.0-34.9) 07/09/2022   Hypnagogic hallucinations 10/22/2016   Gestational diabetes mellitus (GDM) affecting fifth pregnancy 10/22/2016   Unwanted fertility 09/17/2016   Bowel trouble 02/07/2016   Constipation 02/07/2016    PCP: Philip Aspen, Limmie Patricia, MD   REFERRING PROVIDER: Philip Aspen, Limmie Patricia, MD   REFERRING DIAG: [phone] (ICD-10-CM) - Strain of abdominal  wall, initial encounter   Rationale for Evaluation and Treatment: Rehabilitation  THERAPY DIAG:  Pain in thoracic spine  Cramp and spasm  ONSET DATE: June 14th, 2024  SUBJECTIVE:                                                                                                                                                                                           SUBJECTIVE STATEMENT: 03/25/23: pt reports the estim was helpful with the R upper  shoulder and she is planning to order one. She feels her confidence is increasing re: return to work.  EVAL: Pt reports she strained her R ant/lat chest/abdominal area when lifting a package at [employer]. Pt notes the pain can radiate to her R back and her R upper shoulder hurts as well. At the time of the injury, the pt states the pain was intense and she felt like she was going to throw up. Pt indicates there was an area of swelling which ran along her ant/lat chest from under her R breast to her last rib. Following the injury, she worked on [employer] duty (small sort) for 1 week. Currently she is out of work on [employer]. She can return to work on light duty when she can tolerate lifting 15 lbs for 3.5 hours. In addition to pain, pt reports muscle spasms in the area. Pt has 6 children.  Pt is Rt handed  PERTINENT HISTORY:  NA  PAIN:  Are you having pain? Yes: NPRS scale: 2/10 R upper trap and mid back Pain location: R superior scap Pain description:achy Aggravating factors: HEP, driving, sweeping, pushing large grocery cart  Relieving factors: Cold pack, hot bath soaks Pain range at worst 5/10 superior scap; pain at worst on side 2/10   PRECAUTIONS: None  WEIGHT BEARING RESTRICTIONS: No  FALLS:  Has patient fallen in last 6 months? 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 Pt's need to be able to lift 75# to return to full duty  PLOF: Independent  PATIENT GOALS: Pain relief and to return to work  OBJECTIVE:   DIAGNOSTIC FINDINGS:  NA  PATIENT SURVEYS:  FOTO: Perceived function   31%, predicted   57%  FOTO 38% 10/09/22 FOTO 42% 11/14/22 FOTO 43% 12/23/22 FOTO 40% 02/06/23 FOTO Ribs: 52% 03/10/23:  SCREENING FOR RED FLAGS: Bowel or bladder incontinence:   COGNITION: Overall cognitive status: Within functional limits for tasks assessed     SENSATION: WFL  MUSCLE LENGTH: Hamstrings: Right NT deg; Left NT deg Maisie Fus test: Right NT deg; Left NT deg  POSTURE: rounded shoulders and forward head  PALPATION: TTP to the ant/lat R rib cage from the 5th to 12th ribs and of the R upper trap  UE MMT:  MMT Right 09/04/22 Left 09/04/22 Right  11/03/22 Rt 11/14/22 RT 03/10/23  Shoulder flexion 4p 5 4 4+ 4+ pain top of shoulder  Shoulder extension 4p 5     Shoulder abduction 4p 5 4 4+ 4+ top of shoulder  Shoulder adduction 4p 5     Shoulder extension 4p 5     Shoulder internal rotation 4p 5 5    Shoulder external rotation 4p 5 4+ p in right side 4+ p rt side 4+ no pain  Elbow flexion 5 5     Elbow extension 5 5     Wrist flexion       Wrist extension       Wrist ulnar deviation       Wrist radial deviation       Wrist pronation       Wrist supination        (Blank rows = not tested) P=denotes concordant pain  UE ROM:  AROM Right 09/04/22 Left 09/04/22 RT 10/07/22 RT 11/03/22 RT 11/14/22 RT 12/05/22 RT 12/23/22 RT 01/13/23 RT 03/10/23  Shoulder flexion 100 150 130 128 140d p 132 A 145d  145 157  Shoulder extension           Shoulder abduction 100 150 120 120 128d p 138 A 140d 145 155  Shoulder adduction           Shoulder extension           Shoulder internal rotation           Shoulder external rotation           Middle trapezius           Lower trapezius           Elbow flexion           Elbow extension           Wrist  flexion           Wrist extension           Wrist ulnar deviation           Wrist radial deviation           Wrist pronation           Wrist supination           Grip strength            (Blank rows = not tested)    P=denotes concordant pain  TODAY'S TREATMENT: [facility] Adult PT Treatment:                                                DATE: 03/25/23 Therapeutic Exercise: UBE 2 mins fwd and bwd L2 Chest pull 45# Lat pull 45# Bicep curl 20# Chest press 35# Therapeutic Activity: Floor to waist hinged hip lifts 55# 2x10 Waist to OH lifts 15# 2x10 Suitcase Carry 25# 185 ft each arm   [facility] Adult PT Treatment:                                                DATE: 03/17/23 Therapeutic Exercise: UBE 2 mins fwd and bwd L2 Chest pull 45# Lat pull 45# Bicep curl 20# Chest press 35# Therapeutic Activity:  Floor to waist lifts 45# 2x10 Floor to waist to chest lifts 40# 3x5 20# OH press from chest height using bil UE x 10 Modalities: PreMod Estim to the R upper trap and mid back x15 mins 19v intensity  [facility] Adult PT Treatment:                                                DATE: 03/10/23 Therapeutic Exercise: Prone ITY x 10 each thumbs up  Seated levator stretch, upper trap stretch  Therapeutic Activity: Lifting waist to shoulder height 15# using bil UE x 10, 20# x 10 20# OH press from chest height using bil UE x 10 33# total box lift to waist x 10   [facility] Adult PT Treatment:  DATE: 03/03/23 Therapeutic Exercise: UBE 2 mins fwd and bwd Shoulder row FM 20# each UE 2x10 D1 complex dynamic lifts c step FM bilat 10# 2x10 Hinged hip lifting 55# 2x10 Deadlift 55# 2 x 10  Suitcase Carry 20# 100 ft each arm x2  [facility] Adult PT Treatment:                                                DATE: 02/17/23 Therapeutic Exercise: UBE 2 mins fwd and bwd Hinged hip lifting 2x10 45# Dead lifts 2x5 45# Suitcase Carry 15# 100 Ft each arm Suitcase Carry 20#  100 ft each arm  Deadlift 45# 2 x 6  FM row 20# 2 x 10 Chops 13# x 6 each  Shoulder pull downs  17#  Single arm lifts waist to shoulder height, then overhead height 2x10 10#    PATIENT EDUCATION:  Education details: Eval findings, POC, HEP, self care  Person educated: Patient Education method: Explanation, Demonstration, Tactile cues, Verbal cues, and Handouts Education comprehension: verbalized understanding, returned demonstration, verbal cues required, tactile cues required, and needs further education  HOME EXERCISE PROGRAM: Access Code: [email or web address] URL: [email or web address] Date: 01/06/2023 Prepared by: Joellyn Rued  Exercises - Standing 'L' Stretch at Counter  - 2 x daily - 7 x weekly - 1 sets - 10 reps - 5-20 hold - Shoulder Flexion Wall Slide with Towel  - 2 x daily - 7 x weekly - 1 sets - 10 reps - 5=10 hold - Supine Posterior Pelvic Tilt  - 2 x daily - 7 x weekly - 1 sets - 10 reps - 3 hold - Supine Shoulder Flexion Extension AAROM with Dowel  - 2 x daily - 7 x weekly - 1 sets - 10 reps - Standing Anti-Rotation Press with Anchored Resistance  - 2 x daily - 7 x weekly - 1 sets - 10 reps - 3 hold - Seated Upper Trapezius Stretch  - 1 x daily - 7 x weekly - 1 sets - 3 reps - 15-30 hold - Gentle Levator Scapulae Stretch  - 1 x daily - 7 x weekly - 1 sets - 3 reps - 15-30 hold - Hooklying Rib Cage Breathing  - 2 x daily - 7 x weekly - 2 sets - 5 reps - 5 hold - Hooklying Isometric Hip Flexion  - 1 x daily - 7 x weekly - 2 sets - 5 reps - 5 hold - Hooklying Isometric Hip Flexion with Opposite Arm  - 1 x daily - 7 x weekly - 2 sets - 5 reps - 5 hold - Standing March with Alternating [facility]  - 1 x daily - 7 x weekly - 3 sets - 10 reps - Supine Cervical Retraction with Towel  - 1 x daily - 7 x weekly - 1 sets - 10 reps - 3 hold - Supine Deep Neck Flexor Training - Repetitions  - 1 x daily - 7 x weekly - 1 sets - 10 reps - 5 hold - Seated Cervical Sidebending  Stretch  - 1 x daily - 7 x weekly - 2 sets - 10 reps - 15 hold - Standing Cervical Rotation AROM with Overpressure (Mirrored)  - 1 x daily - 7 x weekly - 2 sets - 10 reps - 15 hold  ASSESSMENT:  CLINICAL IMPRESSION: PT was continued for  upper body/trunk strengthening with work related lifting tasks with increased physical demand.Pt continues to make appropriate progressions with demand. From last week, the pt found the estim unit was helpful with managing her pain and is looking to order a TENs unit. Pt is to return to work on 03/30/23 with accommodations initially. Pt will continue to benefit from skilled PT to address impairments for improved function for RTW.  OBJECTIVE IMPAIRMENTS: decreased activity tolerance, decreased ROM, decreased strength, increased muscle spasms, and pain.   ACTIVITY LIMITATIONS: carrying, lifting, dressing, reach over head, and caring for others  PARTICIPATION LIMITATIONS: meal prep, cleaning, laundry, driving, occupation, and yard work  PERSONAL FACTORS: Time since onset of injury/illness/exacerbation are also affecting patient's functional outcome.   REHAB POTENTIAL: Excellent  CLINICAL DECISION MAKING: Stable/uncomplicated  EVALUATION COMPLEXITY: Low   GOALS:  SHORT TERM GOALS: Target date: 09/26/22  Pt will be Ind in an initial HEP  Baseline: started Goal status: MET 2.  Pt will voice understanding of measures to assist in pain reduction  Baseline: started 09/25/22: use of cold pack, moist heat, knee bolster for sleep position Goal status: MET   LONG TERM GOALS: Target date: 04/28/23  Pt will be Ind in a final HEP to maintain achieved LOF  Baseline: started Goal status: ONGOING  2.  Increase R shoulder flexion and abd to 145d for appropriate upper body function Baseline: 100d for both 10/07/22: Flex 130; abd 120 11/14/22: Flex 140; abd 128 12/05/22: abd 138 12/23/22: 145 flexion 01/13/23: 145 flexion and abduction Goal status: MET   3.  Pt will  report a decrease in pain to 0-3/10 with daily and work related activities form improved QOL and to return back to work Baseline: 3-8/10 See pain scale above 12/23/22: 3-4/10 rest, up to 5-6/10 with activity 04-06-23: 5/10 for shoulder, 2/10 for abdomen GOAL status: Partially met   Goal status: MET for abdominal pain, Not met for shoulder   4.  Increase R shoulder strength to 4+/5 or greater for improved R UE function for return to work  Baseline: See flow sheets 11/03/22: see chart, partially me 11/14/22: see flow sheet Goal status: MET  5.  Pt will be able to handle 15#, lifting from waist to shoulder height 2 set of 10  Baseline: not tested 10/14/22: 3# and 5# waist to shoulder height x10 each 11/03/22: can lift 15# dumb bell using both UE from counter to should height shelf 1 x 10 12/05/22: can lift 15# dumb bell using both UE from counter to should height shelf 2 x 10 01/20/23: 10# 2x10 R UE 02/06/23: 10# 2x10 R UE, 15# bilat UE 04-06-23: 20# using bilateral UE Goal status:MET   6.  Pt will be able to handle 75#, lifting from floor to waist height  2 sets of 10 Baseline: not tested 10/17/22: 10# hinged hip lifting 11/03/22: 25 # hinged hip lifting  12/23/22: 25# hinged hip lifting  01/20/23: 30# hinged hip lifting 02/06/23: 45# hinged hip lifting and dead lifts 2003/04/06: can lift 33# floor to waist 1 set of 10 Goal status: Improving-Not yet met  7.  Pt's FOTO score will improved to the predicted value of 57% as indication of improved function  Baseline: 31% 10/09/22: 38%  11/14/22: 42% 12/23/22: 43% 02/06/23: 40% 06-Apr-2023: 52%  Goal status: Improving-NOT YET met  PLAN:  PT FREQUENCY: 1x per week  PT DURATION: 8 weeks  PLANNED INTERVENTIONS: Therapeutic exercises, Therapeutic activity, Patient/Family education, Self Care, Joint mobilization, Dry Needling, Electrical stimulation,  Cryotherapy, Moist heat, Taping, Ultrasound, Ionotophoresis 4mg /ml Dexamethasone, Manual therapy, and  Re-evaluation.  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  Krystn Dermody MS, PT 03/25/23 11:47 AM

## 2023-03-25 ENCOUNTER — Ambulatory Visit: Payer: BC Managed Care – PPO

## 2023-03-25 DIAGNOSIS — R252 Cramp and spasm: Secondary | ICD-10-CM | POA: Diagnosis not present

## 2023-03-25 DIAGNOSIS — M546 Pain in thoracic spine: Secondary | ICD-10-CM

## 2023-03-31 ENCOUNTER — Ambulatory Visit: Payer: BC Managed Care – PPO

## 2023-04-07 ENCOUNTER — Ambulatory Visit: Payer: BC Managed Care – PPO | Attending: Internal Medicine

## 2023-04-08 ENCOUNTER — Telehealth: Payer: Self-pay

## 2023-04-08 NOTE — Telephone Encounter (Signed)
Pt no showed her last PT appt. Spoke to pt who reports she forgot about it. Per pt she has returned to work and is doing well. Pt is in agreement to DC from PT at this time.

## 2023-05-20 ENCOUNTER — Ambulatory Visit (HOSPITAL_COMMUNITY)

## 2023-05-22 ENCOUNTER — Ambulatory Visit: Admitting: Family Medicine

## 2023-05-22 ENCOUNTER — Emergency Department (HOSPITAL_BASED_OUTPATIENT_CLINIC_OR_DEPARTMENT_OTHER)

## 2023-05-22 ENCOUNTER — Other Ambulatory Visit: Payer: Self-pay

## 2023-05-22 ENCOUNTER — Emergency Department (HOSPITAL_BASED_OUTPATIENT_CLINIC_OR_DEPARTMENT_OTHER): Admission: EM | Admit: 2023-05-22 | Discharge: 2023-05-22 | Disposition: A | Discharge status: Home / Self Care

## 2023-05-22 ENCOUNTER — Encounter (HOSPITAL_BASED_OUTPATIENT_CLINIC_OR_DEPARTMENT_OTHER): Payer: Self-pay | Admitting: Emergency Medicine

## 2023-05-22 DIAGNOSIS — R0602 Shortness of breath: Secondary | ICD-10-CM

## 2023-05-22 DIAGNOSIS — D649 Anemia, unspecified: Secondary | ICD-10-CM | POA: Insufficient documentation

## 2023-05-22 LAB — BASIC METABOLIC PANEL WITH GFR
Anion gap: 5 (ref 5–15)
BUN: 17 mg/dL (ref 6–20)
CO2: 26 mmol/L (ref 22–32)
Calcium: 8.3 mg/dL — ABNORMAL LOW (ref 8.9–10.3)
Chloride: 107 mmol/L (ref 98–111)
Creatinine, Ser: 0.96 mg/dL (ref 0.44–1.00)
GFR, Estimated: >60 mL/min (ref >60)
Glucose, Bld: 90 mg/dL (ref 70–99)
Potassium: 3.9 mmol/L (ref 3.5–5.1)
Sodium: 138 mmol/L (ref 135–145)

## 2023-05-22 LAB — RESP PANEL BY RT-PCR (RSV, FLU A&B, COVID)  RVPGX2
Influenza A by PCR: NEGATIVE
Influenza B by PCR: NEGATIVE
Resp Syncytial Virus by PCR: NEGATIVE
SARS Coronavirus 2 by RT PCR: NEGATIVE

## 2023-05-22 LAB — CBC
HCT: 33.7 % — ABNORMAL LOW (ref 36.0–46.0)
Hemoglobin: 11 g/dL — ABNORMAL LOW (ref 12.0–15.0)
MCH: 23.3 pg — ABNORMAL LOW (ref 26.0–34.0)
MCHC: 32.6 g/dL (ref 30.0–36.0)
MCV: 71.4 fL — ABNORMAL LOW (ref 80.0–100.0)
Platelets: 147 10*3/uL — ABNORMAL LOW (ref 150–400)
RBC: 4.72 MIL/uL (ref 3.87–5.11)
RDW: 14.6 % (ref 11.5–15.5)
WBC: 8.1 10*3/uL (ref 4.0–10.5)
nRBC: 0 % (ref 0.0–0.2)

## 2023-05-22 LAB — TROPONIN I (HIGH SENSITIVITY): Troponin I (High Sensitivity): <2 ng/L (ref <18)

## 2023-05-22 MED ORDER — IPRATROPIUM-ALBUTEROL 0.5-2.5 (3) MG/3ML IN SOLN
3.0000 mL | Freq: Once | RESPIRATORY_TRACT | Status: AC
  Administered 2023-05-22: 3 mL via RESPIRATORY_TRACT
  Filled 2023-05-22: qty 3

## 2023-05-22 MED ORDER — PREDNISONE 20 MG PO TABS
40.0000 mg | ORAL_TABLET | Freq: Once | ORAL | Status: AC
  Administered 2023-05-22: 40 mg via ORAL
  Filled 2023-05-22: qty 2

## 2023-05-22 MED ORDER — PREDNISONE 50 MG PO TABS: ORAL_TABLET | ORAL | 0 refills | Status: DC

## 2023-05-22 MED ORDER — ALBUTEROL SULFATE HFA 108 (90 BASE) MCG/ACT IN AERS
1.0000 | INHALATION_SPRAY | Freq: Four times a day (QID) | RESPIRATORY_TRACT | 0 refills | Status: AC | PRN
Start: 2023-05-22 — End: ?

## 2023-05-22 NOTE — Discharge Instructions (Signed)
 Please take the prednisone daily for the next 4 days.  Use 2 puffs of the albuterol every 4 hours over the next 1 to 2 days.  Your blood counts were a little low.  Please follow-up with your doctor to have this rechecked as an outpatient.  Please return to the ER for worsening symptoms.

## 2023-05-22 NOTE — ED Triage Notes (Signed)
 States she is having to use her inhaler more often than normal. States SHOB is worse when laying down. Endorses recent flu like symptoms. Denies CP.

## 2023-05-22 NOTE — ED Provider Notes (Signed)
 [place] EMERGENCY DEPARTMENT AT [facility] Provider Note   CSN: [id number] Arrival date & time: 05/22/23  3664     History  Chief Complaint  Patient presents with   Shortness of Breath    Kristen [name] is a 43 y.o. female.  43 year old female with past medical history of asthma presenting to the emergency department today with shortness of breath.  The patient states that she had flulike symptoms last week.  These resolved but she states that over the past few days that she has been having shortness of breath.  She reports this is worse when she lays flat.  She states that the symptoms do seem to be worse at night.  She has had a cough that is minimally productive.  Denies any associated fevers.  She denies any leg pain or swelling.  Denies a history of DVT or pulmonary embolism, recent surgeries, recent travel.  She is not on any estrogen-containing medications.  She came to the ER today for further evaluation regarding this due to ongoing symptoms.   Shortness of Breath Associated symptoms: cough        Home Medications Prior to Admission medications   Medication Sig Start Date End Date Taking? Authorizing Provider  albuterol (VENTOLIN HFA) 108 (90 Base) MCG/ACT inhaler Inhale 1-2 puffs into the lungs every 6 (six) hours as needed for wheezing or shortness of breath. 05/22/23  Yes Durwin Glaze, MD  predniSONE (DELTASONE) 50 MG tablet Take 1 tablet by mouth daily 05/22/23  Yes Durwin Glaze, MD  albuterol (VENTOLIN HFA) 108 (90 Base) MCG/ACT inhaler Inhale 2 puffs into the lungs every 4 (four) hours as needed for wheezing or shortness of breath. 08/13/22   Philip Aspen, Limmie Patricia, MD  cetirizine (ZYRTEC) 10 MG tablet Take 1 tablet (10 mg total) by mouth daily. 07/09/22   Philip Aspen, Limmie Patricia, MD  fluticasone (FLONASE) 50 MCG/ACT nasal spray Place 2 sprays into both nostrils daily. 12/02/22   Philip Aspen, Limmie Patricia, MD  triamcinolone ointment (KENALOG)  0.5 % Apply 1 Application topically 2 (two) times daily. 12/02/22   Philip Aspen, Limmie Patricia, MD  VITAMIN D PO Take 1 tablet by mouth daily at 6 (six) AM.    [provider]  buPROPion (WELLBUTRIN) 100 MG tablet Take 1 tablet (100 mg total) by mouth 2 (two) times daily. Patient not taking: Reported on 04/08/2018 03/17/18 11/15/18  Rodriguez-Southworth, Nettie Elm, PA-C  Fluticasone-Salmeterol (ADVAIR DISKUS) 100-50 MCG/DOSE AEPB Inhale 1 puff into the lungs 2 (two) times daily. Patient not taking: Reported on 04/08/2018 03/18/18 11/15/18  Rodriguez-Southworth, Nettie Elm, PA-C      Allergies    Hydrocodone, Morphine and codeine, Penicillins, and Percocet [oxycodone-acetaminophen]    Review of Systems   Review of Systems  Respiratory:  Positive for cough and shortness of breath.   All other systems reviewed and are negative.   Physical Exam Updated Vital Signs BP 131/80 (BP Location: Right Arm)   Pulse 82   Temp 98.7 F (37.1 C) (Oral)   Resp 18   Ht 5\' 3"  (1.6 m)   Wt 99.8 kg   SpO2 99%   BMI 38.97 kg/m  Physical Exam Vitals and nursing note reviewed.   Gen: NAD Eyes: PERRL, EOMI HEENT: no oropharyngeal swelling Neck: trachea midline Resp: Diminished bilaterally Card: RRR, no murmurs, rubs, or gallops Abd: nontender, nondistended Extremities: no calf tenderness, no edema Vascular: 2+ radial pulses bilaterally, 2+ DP pulses bilaterally Skin: no rashes Psyc: acting appropriately  ED Results / Procedures / Treatments   Labs (all labs ordered are listed, but only abnormal results are displayed) Labs Reviewed  BASIC METABOLIC PANEL WITH GFR - Abnormal; Notable for the following components:      Result Value   Calcium 8.3 (*)    All other components within normal limits  CBC - Abnormal; Notable for the following components:   Hemoglobin 11.0 (*)    HCT 33.7 (*)    MCV 71.4 (*)    MCH 23.3 (*)    Platelets 147 (*)    All other components within normal limits  RESP  PANEL BY RT-PCR (RSV, FLU A&B, COVID)  RVPGX2  TROPONIN I (HIGH SENSITIVITY)    EKG EKG Interpretation Date/Time:  Friday May 22 2023 08:07:36 EDT Ventricular Rate:  79 PR Interval:  164 QRS Duration:  89 QT Interval:  376 QTC Calculation: 431 R Axis:   32  Text Interpretation: Sinus rhythm Low voltage, precordial leads Confirmed by Beckey Downing [phone]) on 05/22/2023 8:31:03 AM  Radiology DG Chest Port 1 View Result Date: 05/22/2023 CLINICAL DATA:  Shortness of breath. EXAM: PORTABLE CHEST 1 VIEW COMPARISON:  03/17/2018. FINDINGS: The heart size and mediastinal contours are within normal limits. No focal consolidation, sizeable pleural effusion, or pneumothorax. No acute osseous abnormality. IMPRESSION: No acute cardiopulmonary findings. Electronically Signed   By: Hart Robinsons M.D.   On: 05/22/2023 08:42    Procedures Procedures    Medications Ordered in ED Medications  predniSONE (DELTASONE) tablet 40 mg (has no administration in time range)  ipratropium-albuterol (DUONEB) 0.5-2.5 (3) MG/3ML nebulizer solution 3 mL (3 mLs Nebulization Given 05/22/23 0826)  ipratropium-albuterol (DUONEB) 0.5-2.5 (3) MG/3ML nebulizer solution 3 mL (3 mLs Nebulization Given 05/22/23 0825)    ED Course/ Medical Decision Making/ A&P                                 Medical Decision Making 43 year old female presenting the emergency department today with shortness of breath and symptoms that are worsened at night.  I will further evaluate the patient here with basic labs Wels and EKG, chest x-ray, and troponin further evaluation for pulmonary edema, pulmonary infiltrates, pneumothorax.  I will give the patient DuoNebs.  She is diminished here.'s may be due to asthma but I do not appreciate a lot of wheezing here on exam.  The patient is PERC negative and suspicion for pulmonary embolism is low at this time.  Also obtain an RSV/COVID/flu swab to evaluate for viral etiology.  I will reevaluate for  ultimate disposition.  The patient's work appears reassuring.  She does have some mild anemia which does appear new.  She is encouraged to follow-up with her primary care provider regarding this.  She is started on prednisone for a few days here and will be discharged with return precautions.  Amount and/or Complexity of Data Reviewed Labs: ordered. Radiology: ordered.  Risk Prescription drug management.           Final Clinical Impression(s) / ED Diagnoses Final diagnoses:  Shortness of breath  Anemia, unspecified type    Rx / DC Orders ED Discharge Orders          Ordered    predniSONE (DELTASONE) 50 MG tablet        05/22/23 0921    albuterol (VENTOLIN HFA) 108 (90 Base) MCG/ACT inhaler  Every 6 hours PRN  05/22/23 1610              Durwin Glaze, MD 05/22/23 [phone]

## 2023-06-04 ENCOUNTER — Ambulatory Visit: Admitting: Internal Medicine

## 2023-06-04 ENCOUNTER — Encounter: Payer: Self-pay | Admitting: Internal Medicine

## 2023-06-04 VITALS — BP 120/84 | HR 94 | Temp 98.2°F | Wt 223.6 lb

## 2023-06-04 DIAGNOSIS — J4541 Moderate persistent asthma with (acute) exacerbation: Secondary | ICD-10-CM

## 2023-06-04 DIAGNOSIS — J302 Other seasonal allergic rhinitis: Secondary | ICD-10-CM | POA: Diagnosis not present

## 2023-06-04 MED ORDER — ASMANEX HFA 200 MCG/ACT IN AERO
2.0000 | INHALATION_SPRAY | Freq: Two times a day (BID) | RESPIRATORY_TRACT | 3 refills | Status: DC
Start: 2023-06-04 — End: 2023-08-24

## 2023-06-04 NOTE — Progress Notes (Signed)
 Established Patient Office Visit     CC/Reason for Visit: Allergies and asthma  HPI: Kristen [name] is a 43 y.o. female who is coming in today for the above mentioned reasons. Past Medical History is significant for: Mild intermittent asthma.  Recently with spring seasonal allergies, she has been having an increase in her asthma symptoms including cough and shortness of breath.  She had to go to the emergency department where she was giving a nebulizer treatment and some steroids which have improved but she is starting to feel worse again.  She has also been having typical allergy symptoms with postnasal drip.  She takes cetirizine and Flonase every day.  She has been using her rescue albuterol inhaler every 3-4 hours.   Past Medical/Surgical History: Past Medical History:  Diagnosis Date   Alpha thalassemia (HCC)    Asthma    uses inhaler daily/as needed   Complication of anesthesia    reaction to medication in epidural -morphine   Endometriosis    Preterm labor with preterm delivery    Seasonal allergies    Tubular adenoma of colon     Past Surgical History:  Procedure Laterality Date   CESAREAN SECTION     CESAREAN SECTION N/A 10/29/2015   Procedure: CESAREAN SECTION;  Surgeon: Adam Phenix, MD;  Location: [facility];  Service: Obstetrics;  Laterality: N/A;   DILATION AND CURETTAGE OF UTERUS     WISDOM TOOTH EXTRACTION  2014    Social History:  reports that she has quit smoking. Her smoking use included cigars. She has never been exposed to tobacco smoke. She has never used smokeless tobacco. She reports that she does not drink alcohol and does not use drugs.  Allergies: Allergies  Allergen Reactions   Hydrocodone Shortness Of Breath   Morphine And Codeine Shortness Of Breath and Itching    Burning   Penicillins Hives    Has patient had a PCN reaction causing immediate rash, facial/tongue/throat swelling, SOB or lightheadedness with hypotension:  Yes Has patient had a PCN reaction causing severe rash involving mucus membranes or skin necrosis: Yes Has patient had a PCN reaction that required hospitalization: No Has patient had a PCN reaction occurring within the last 10 years: Yes If all of the above answers are "NO", then may proceed with Cephalosporin use.    Percocet [Oxycodone-Acetaminophen] Anaphylaxis    Pt has taken hydromorphone without complications in the past    Family History:  Family History  Problem Relation Age of Onset   HIV Father    Colon cancer Maternal Uncle    Diabetes Maternal Grandmother    Diabetes Paternal Grandmother    Stomach cancer Neg Hx    Rectal cancer Neg Hx    Esophageal cancer Neg Hx    Liver cancer Neg Hx    Colon polyps Neg Hx      Current Outpatient Medications:    albuterol (VENTOLIN HFA) 108 (90 Base) MCG/ACT inhaler, Inhale 2 puffs into the lungs every 4 (four) hours as needed for wheezing or shortness of breath., Disp: 18 g, Rfl: 1   albuterol (VENTOLIN HFA) 108 (90 Base) MCG/ACT inhaler, Inhale 1-2 puffs into the lungs every 6 (six) hours as needed for wheezing or shortness of breath., Disp: 1 each, Rfl: 0   cetirizine (ZYRTEC) 10 MG tablet, Take 1 tablet (10 mg total) by mouth daily., Disp: 30 tablet, Rfl: 11   fluticasone (FLONASE) 50 MCG/ACT nasal spray, Place 2 sprays into  both nostrils daily., Disp: 9.9 mL, Rfl: 3   Mometasone Furoate (ASMANEX HFA) 200 MCG/ACT AERO, Inhale 2 puffs into the lungs 2 (two) times daily., Disp: 1 each, Rfl: 3   triamcinolone ointment (KENALOG) 0.5 %, Apply 1 Application topically 2 (two) times daily., Disp: 30 g, Rfl: 3   VITAMIN D PO, Take 1 tablet by mouth daily at 6 (six) AM., Disp: , Rfl:   Review of Systems:  Negative unless indicated in HPI.   Physical Exam: Vitals:   06/04/23 0735  BP: 120/84  Pulse: 94  Temp: 98.2 F (36.8 C)  TempSrc: Oral  SpO2: 97%  Weight: 223 lb 9.6 oz (101.4 kg)    Body mass index is 39.61  kg/m.   Physical Exam Vitals reviewed.  Constitutional:      Appearance: Normal appearance.  HENT:     Head: Normocephalic and atraumatic.  Eyes:     Conjunctiva/sclera: Conjunctivae normal.     Pupils: Pupils are equal, round, and reactive to light.  Cardiovascular:     Rate and Rhythm: Normal rate and regular rhythm.  Pulmonary:     Effort: Pulmonary effort is normal.     Breath sounds: Normal breath sounds.  Skin:    General: Skin is warm and dry.  Neurological:     General: No focal deficit present.     Mental Status: She is alert and oriented to person, place, and time.  Psychiatric:        Mood and Affect: Mood normal.        Behavior: Behavior normal.        Thought Content: Thought content normal.        Judgment: Judgment normal.      Impression and Plan:  Seasonal allergies  Moderate persistent asthma with exacerbation -     Asmanex HFA; Inhale 2 puffs into the lungs 2 (two) times daily.  Dispense: 1 each; Refill: 3   -For her allergies she will continue daily antihistamine and fluticasone nasal spray. -I will step up the treatment of her asthma and add a steroid inhaler for maintenance daily. -She knows to follow-up with me in 2 to 3 weeks if no improvement.  Time spent:31 minutes reviewing chart, interviewing and examining patient and formulating plan of care.     Chaya Jan, MD [facility]

## 2023-06-18 ENCOUNTER — Other Ambulatory Visit: Payer: Self-pay | Admitting: Internal Medicine

## 2023-06-18 DIAGNOSIS — J309 Allergic rhinitis, unspecified: Secondary | ICD-10-CM

## 2023-07-29 ENCOUNTER — Other Ambulatory Visit: Payer: Self-pay | Admitting: Internal Medicine

## 2023-07-29 DIAGNOSIS — J302 Other seasonal allergic rhinitis: Secondary | ICD-10-CM

## 2023-08-18 ENCOUNTER — Telehealth: Payer: Self-pay | Admitting: *Deleted

## 2023-08-18 NOTE — Telephone Encounter (Signed)
 Left detailed message on machine for patient letting her know that her form for [employer] has been faxed and confirmed.

## 2023-08-24 ENCOUNTER — Other Ambulatory Visit: Payer: Self-pay | Admitting: Internal Medicine

## 2023-08-24 DIAGNOSIS — J4541 Moderate persistent asthma with (acute) exacerbation: Secondary | ICD-10-CM
